# Patient Record
Sex: Female | Born: 1950
Health system: Southern US, Community
[De-identification: ages and names within clinical notes are randomized; demographics above are authoritative.]

## PROBLEM LIST (undated history)

## (undated) DIAGNOSIS — I10 Essential (primary) hypertension: Secondary | ICD-10-CM

## (undated) DIAGNOSIS — E785 Hyperlipidemia, unspecified: Secondary | ICD-10-CM

## (undated) DIAGNOSIS — D126 Benign neoplasm of colon, unspecified: Secondary | ICD-10-CM

## (undated) DIAGNOSIS — T7840XA Allergy, unspecified, initial encounter: Secondary | ICD-10-CM

## (undated) DIAGNOSIS — N209 Urinary calculus, unspecified: Secondary | ICD-10-CM

## (undated) DIAGNOSIS — K219 Gastro-esophageal reflux disease without esophagitis: Secondary | ICD-10-CM

## (undated) DIAGNOSIS — K649 Unspecified hemorrhoids: Secondary | ICD-10-CM

## (undated) DIAGNOSIS — F419 Anxiety disorder, unspecified: Secondary | ICD-10-CM

## (undated) HISTORY — DX: Allergy, unspecified, initial encounter: T78.40XA

## (undated) HISTORY — DX: Urinary calculus, unspecified: N20.9

## (undated) HISTORY — DX: Hyperlipidemia, unspecified: E78.5

## (undated) HISTORY — DX: Unspecified hemorrhoids: K64.9

## (undated) HISTORY — DX: Anxiety disorder, unspecified: F41.9

## (undated) HISTORY — DX: Gastro-esophageal reflux disease without esophagitis: K21.9

## (undated) HISTORY — PX: OTHER SURGICAL HISTORY: SHX169

## (undated) HISTORY — PX: COLONOSCOPY: SHX174

## (undated) HISTORY — DX: Essential (primary) hypertension: I10

## (undated) HISTORY — DX: Benign neoplasm of colon, unspecified: D12.6

---

## 1999-12-26 ENCOUNTER — Other Ambulatory Visit: Admission: RE | Admit: 1999-12-26 | Discharge: 1999-12-26 | Payer: Self-pay | Admitting: Endocrinology

## 1999-12-27 ENCOUNTER — Encounter: Admission: RE | Admit: 1999-12-27 | Discharge: 1999-12-27 | Payer: Self-pay | Admitting: Endocrinology

## 1999-12-27 ENCOUNTER — Encounter: Payer: Self-pay | Admitting: Endocrinology

## 2000-12-27 ENCOUNTER — Encounter: Payer: Self-pay | Admitting: Endocrinology

## 2000-12-27 ENCOUNTER — Encounter: Admission: RE | Admit: 2000-12-27 | Discharge: 2000-12-27 | Payer: Self-pay | Admitting: Endocrinology

## 2001-04-30 DIAGNOSIS — D126 Benign neoplasm of colon, unspecified: Secondary | ICD-10-CM

## 2001-04-30 HISTORY — DX: Benign neoplasm of colon, unspecified: D12.6

## 2001-05-09 ENCOUNTER — Other Ambulatory Visit: Admission: RE | Admit: 2001-05-09 | Discharge: 2001-05-09 | Payer: Self-pay | Admitting: Endocrinology

## 2002-01-08 ENCOUNTER — Encounter: Admission: RE | Admit: 2002-01-08 | Discharge: 2002-01-08 | Payer: Self-pay | Admitting: Endocrinology

## 2002-01-08 ENCOUNTER — Encounter: Payer: Self-pay | Admitting: Endocrinology

## 2003-01-25 ENCOUNTER — Encounter: Payer: Self-pay | Admitting: Endocrinology

## 2003-01-25 ENCOUNTER — Encounter: Admission: RE | Admit: 2003-01-25 | Discharge: 2003-01-25 | Payer: Self-pay | Admitting: Endocrinology

## 2004-02-10 ENCOUNTER — Ambulatory Visit (HOSPITAL_COMMUNITY): Admission: RE | Admit: 2004-02-10 | Discharge: 2004-02-10 | Payer: Self-pay | Admitting: Endocrinology

## 2004-02-17 ENCOUNTER — Encounter: Admission: RE | Admit: 2004-02-17 | Discharge: 2004-02-17 | Payer: Self-pay | Admitting: Endocrinology

## 2004-06-02 ENCOUNTER — Ambulatory Visit: Payer: Self-pay | Admitting: Gastroenterology

## 2004-06-16 ENCOUNTER — Ambulatory Visit: Payer: Self-pay | Admitting: Endocrinology

## 2004-08-11 ENCOUNTER — Encounter: Admission: RE | Admit: 2004-08-11 | Discharge: 2004-08-11 | Payer: Self-pay | Admitting: Endocrinology

## 2004-08-25 ENCOUNTER — Ambulatory Visit: Payer: Self-pay | Admitting: Endocrinology

## 2004-09-29 ENCOUNTER — Ambulatory Visit: Payer: Self-pay | Admitting: Endocrinology

## 2004-10-06 ENCOUNTER — Other Ambulatory Visit: Admission: RE | Admit: 2004-10-06 | Discharge: 2004-10-06 | Payer: Self-pay | Admitting: Endocrinology

## 2004-10-06 ENCOUNTER — Ambulatory Visit: Payer: Self-pay | Admitting: Endocrinology

## 2004-10-18 ENCOUNTER — Ambulatory Visit: Payer: Self-pay

## 2004-10-20 ENCOUNTER — Ambulatory Visit: Payer: Self-pay

## 2004-11-07 ENCOUNTER — Ambulatory Visit: Payer: Self-pay | Admitting: Endocrinology

## 2004-11-14 ENCOUNTER — Ambulatory Visit: Payer: Self-pay | Admitting: Endocrinology

## 2004-11-24 ENCOUNTER — Ambulatory Visit: Payer: Self-pay | Admitting: Endocrinology

## 2005-03-07 ENCOUNTER — Encounter: Admission: RE | Admit: 2005-03-07 | Discharge: 2005-03-07 | Payer: Self-pay | Admitting: Endocrinology

## 2006-01-09 ENCOUNTER — Ambulatory Visit: Payer: Self-pay | Admitting: Endocrinology

## 2006-01-14 ENCOUNTER — Ambulatory Visit: Payer: Self-pay | Admitting: Endocrinology

## 2006-01-14 ENCOUNTER — Other Ambulatory Visit: Admission: RE | Admit: 2006-01-14 | Discharge: 2006-01-14 | Payer: Self-pay | Admitting: Endocrinology

## 2006-01-14 ENCOUNTER — Encounter: Payer: Self-pay | Admitting: Endocrinology

## 2006-02-08 ENCOUNTER — Ambulatory Visit: Payer: Self-pay | Admitting: Family Medicine

## 2006-05-01 ENCOUNTER — Encounter: Admission: RE | Admit: 2006-05-01 | Discharge: 2006-05-01 | Payer: Self-pay | Admitting: Endocrinology

## 2006-08-23 ENCOUNTER — Ambulatory Visit: Payer: Self-pay | Admitting: Gastroenterology

## 2006-08-26 ENCOUNTER — Ambulatory Visit: Payer: Self-pay | Admitting: Endocrinology

## 2006-09-06 ENCOUNTER — Ambulatory Visit: Payer: Self-pay | Admitting: Gastroenterology

## 2006-09-06 ENCOUNTER — Encounter: Payer: Self-pay | Admitting: Gastroenterology

## 2006-12-28 ENCOUNTER — Encounter: Payer: Self-pay | Admitting: Endocrinology

## 2006-12-28 DIAGNOSIS — E1169 Type 2 diabetes mellitus with other specified complication: Secondary | ICD-10-CM | POA: Insufficient documentation

## 2006-12-28 DIAGNOSIS — I10 Essential (primary) hypertension: Secondary | ICD-10-CM | POA: Insufficient documentation

## 2006-12-28 DIAGNOSIS — E119 Type 2 diabetes mellitus without complications: Secondary | ICD-10-CM | POA: Insufficient documentation

## 2006-12-28 DIAGNOSIS — F411 Generalized anxiety disorder: Secondary | ICD-10-CM | POA: Insufficient documentation

## 2007-02-21 ENCOUNTER — Ambulatory Visit: Payer: Self-pay | Admitting: Endocrinology

## 2007-02-21 LAB — CONVERTED CEMR LAB
ALT: 33 units/L (ref 0–35)
AST: 31 units/L (ref 0–37)
Albumin: 3.4 g/dL — ABNORMAL LOW (ref 3.5–5.2)
Alkaline Phosphatase: 113 units/L (ref 39–117)
BUN: 13 mg/dL (ref 6–23)
Basophils Absolute: 0.1 10*3/uL (ref 0.0–0.1)
Basophils Relative: 0.7 % (ref 0.0–1.0)
Bilirubin Urine: NEGATIVE
CO2: 30 meq/L (ref 19–32)
Crystals: NEGATIVE
Eosinophils Relative: 3.6 % (ref 0.0–5.0)
GFR calc Af Amer: 95 mL/min
GFR calc non Af Amer: 78 mL/min
HCT: 41 % (ref 36.0–46.0)
HDL: 36.8 mg/dL — ABNORMAL LOW (ref >39.0)
Hemoglobin: 14.1 g/dL (ref 12.0–15.0)
Hgb A1c MFr Bld: 6.2 % — ABNORMAL HIGH (ref 4.6–6.0)
LDL Cholesterol: 76 mg/dL (ref 0–99)
Lymphocytes Relative: 33.3 % (ref 12.0–46.0)
Monocytes Absolute: 0.5 10*3/uL (ref 0.2–0.7)
Monocytes Relative: 5.9 % (ref 3.0–11.0)
Neutrophils Relative %: 56.5 % (ref 43.0–77.0)
Platelets: 222 10*3/uL (ref 150–400)
RBC: 4.51 M/uL (ref 3.87–5.11)
Specific Gravity, Urine: >=1.03 (ref 1.000–1.03)
TSH: 2.6 microintl units/mL (ref 0.35–5.50)
Total CHOL/HDL Ratio: 3.4
Total Protein, Urine: NEGATIVE mg/dL
Triglycerides: 58 mg/dL (ref 0–149)
Urobilinogen, UA: 0.2 (ref 0.0–1.0)
WBC: 8.1 10*3/uL (ref 4.5–10.5)
pH: 5.5 (ref 5.0–8.0)

## 2007-02-28 ENCOUNTER — Ambulatory Visit: Payer: Self-pay | Admitting: Endocrinology

## 2007-04-25 ENCOUNTER — Ambulatory Visit: Payer: Self-pay | Admitting: Endocrinology

## 2007-04-25 DIAGNOSIS — Z8619 Personal history of other infectious and parasitic diseases: Secondary | ICD-10-CM | POA: Insufficient documentation

## 2007-04-25 DIAGNOSIS — B029 Zoster without complications: Secondary | ICD-10-CM

## 2007-06-13 ENCOUNTER — Encounter: Admission: RE | Admit: 2007-06-13 | Discharge: 2007-06-13 | Payer: Self-pay | Admitting: Endocrinology

## 2007-08-20 ENCOUNTER — Encounter: Payer: Self-pay | Admitting: Endocrinology

## 2008-03-09 ENCOUNTER — Ambulatory Visit: Payer: Self-pay | Admitting: Endocrinology

## 2008-03-10 LAB — CONVERTED CEMR LAB
ALT: 31 units/L (ref 0–35)
AST: 28 units/L (ref 0–37)
Alkaline Phosphatase: 99 units/L (ref 39–117)
BUN: 12 mg/dL (ref 6–23)
Basophils Absolute: 0 10*3/uL (ref 0.0–0.1)
Basophils Relative: 0.4 % (ref 0.0–3.0)
Chloride: 100 meq/L (ref 96–112)
Cholesterol: 111 mg/dL (ref 0–200)
Eosinophils Relative: 5.7 % — ABNORMAL HIGH (ref 0.0–5.0)
GFR calc Af Amer: 94 mL/min
GFR calc non Af Amer: 78 mL/min
HDL: 36.4 mg/dL — ABNORMAL LOW (ref >39.0)
Hemoglobin: 13.7 g/dL (ref 12.0–15.0)
Ketones, ur: NEGATIVE mg/dL
Leukocytes, UA: NEGATIVE
MCHC: 33.6 g/dL (ref 30.0–36.0)
MCV: 91.5 fL (ref 78.0–100.0)
Monocytes Relative: 7 % (ref 3.0–12.0)
Neutro Abs: 4.7 10*3/uL (ref 1.4–7.7)
Potassium: 3.7 meq/L (ref 3.5–5.1)
RBC: 4.45 M/uL (ref 3.87–5.11)
Sodium: 141 meq/L (ref 135–145)
Specific Gravity, Urine: 1.025 (ref 1.000–1.03)
TSH: 3.02 microintl units/mL (ref 0.35–5.50)
Total Bilirubin: 0.9 mg/dL (ref 0.3–1.2)
Total CHOL/HDL Ratio: 3
Total Protein: 6.6 g/dL (ref 6.0–8.3)
Urine Glucose: NEGATIVE mg/dL
WBC: 9.3 10*3/uL (ref 4.5–10.5)

## 2008-03-18 ENCOUNTER — Ambulatory Visit: Payer: Self-pay | Admitting: Endocrinology

## 2008-03-18 DIAGNOSIS — E7800 Pure hypercholesterolemia, unspecified: Secondary | ICD-10-CM

## 2008-03-18 DIAGNOSIS — E785 Hyperlipidemia, unspecified: Secondary | ICD-10-CM | POA: Insufficient documentation

## 2008-03-18 DIAGNOSIS — N209 Urinary calculus, unspecified: Secondary | ICD-10-CM

## 2008-03-18 DIAGNOSIS — Z87442 Personal history of urinary calculi: Secondary | ICD-10-CM | POA: Insufficient documentation

## 2008-03-23 ENCOUNTER — Ambulatory Visit: Payer: Self-pay | Admitting: Family Medicine

## 2008-03-23 ENCOUNTER — Encounter: Payer: Self-pay | Admitting: Endocrinology

## 2008-04-19 ENCOUNTER — Telehealth: Payer: Self-pay | Admitting: Endocrinology

## 2008-05-16 ENCOUNTER — Emergency Department (HOSPITAL_COMMUNITY): Admission: EM | Admit: 2008-05-16 | Discharge: 2008-05-16 | Payer: Self-pay | Admitting: Emergency Medicine

## 2008-05-17 ENCOUNTER — Ambulatory Visit: Payer: Self-pay | Admitting: Internal Medicine

## 2008-05-17 DIAGNOSIS — H8309 Labyrinthitis, unspecified ear: Secondary | ICD-10-CM | POA: Insufficient documentation

## 2008-05-17 DIAGNOSIS — G459 Transient cerebral ischemic attack, unspecified: Secondary | ICD-10-CM

## 2008-05-17 DIAGNOSIS — I679 Cerebrovascular disease, unspecified: Secondary | ICD-10-CM | POA: Insufficient documentation

## 2008-05-19 ENCOUNTER — Encounter: Admission: RE | Admit: 2008-05-19 | Discharge: 2008-05-19 | Payer: Self-pay | Admitting: Internal Medicine

## 2008-05-20 ENCOUNTER — Encounter: Payer: Self-pay | Admitting: Internal Medicine

## 2008-05-21 ENCOUNTER — Telehealth (INDEPENDENT_AMBULATORY_CARE_PROVIDER_SITE_OTHER): Payer: Self-pay | Admitting: *Deleted

## 2008-05-25 ENCOUNTER — Ambulatory Visit: Payer: Self-pay | Admitting: Endocrinology

## 2008-05-25 DIAGNOSIS — R42 Dizziness and giddiness: Secondary | ICD-10-CM | POA: Insufficient documentation

## 2008-05-25 LAB — CONVERTED CEMR LAB
Basophils Relative: 0.7 % (ref 0.0–3.0)
CO2: 36 meq/L — ABNORMAL HIGH (ref 19–32)
Calcium: 9.3 mg/dL (ref 8.4–10.5)
Creatinine, Ser: 1 mg/dL (ref 0.4–1.2)
Eosinophils Absolute: 0.3 10*3/uL (ref 0.0–0.7)
GFR calc Af Amer: 73 mL/min
GFR calc non Af Amer: 60 mL/min
HCT: 42.2 % (ref 36.0–46.0)
Hgb A1c MFr Bld: 6.7 % — ABNORMAL HIGH (ref 4.6–6.0)
Lymphocytes Relative: 23.6 % (ref 12.0–46.0)
MCV: 90.6 fL (ref 78.0–100.0)
Monocytes Absolute: 0.4 10*3/uL (ref 0.1–1.0)
Monocytes Relative: 3 % (ref 3.0–12.0)
Neutrophils Relative %: 70.1 % (ref 43.0–77.0)
Potassium: 3.3 meq/L — ABNORMAL LOW (ref 3.5–5.1)
RDW: 12.4 % (ref 11.5–14.6)

## 2008-05-27 ENCOUNTER — Encounter: Payer: Self-pay | Admitting: Endocrinology

## 2008-06-03 ENCOUNTER — Encounter: Payer: Self-pay | Admitting: Endocrinology

## 2008-06-08 ENCOUNTER — Ambulatory Visit: Payer: Self-pay | Admitting: Endocrinology

## 2008-06-08 DIAGNOSIS — E876 Hypokalemia: Secondary | ICD-10-CM | POA: Insufficient documentation

## 2008-06-08 LAB — CONVERTED CEMR LAB
Calcium: 10.3 mg/dL (ref 8.4–10.5)
GFR calc non Af Amer: 78 mL/min
Glucose, Bld: 100 mg/dL — ABNORMAL HIGH (ref 70–99)
Potassium: 3.3 meq/L — ABNORMAL LOW (ref 3.5–5.1)

## 2008-06-10 ENCOUNTER — Telehealth (INDEPENDENT_AMBULATORY_CARE_PROVIDER_SITE_OTHER): Payer: Self-pay | Admitting: *Deleted

## 2008-06-10 ENCOUNTER — Encounter: Payer: Self-pay | Admitting: Endocrinology

## 2008-07-16 ENCOUNTER — Encounter: Admission: RE | Admit: 2008-07-16 | Discharge: 2008-07-16 | Payer: Self-pay | Admitting: Endocrinology

## 2009-05-19 ENCOUNTER — Ambulatory Visit: Payer: Self-pay | Admitting: Endocrinology

## 2009-05-19 LAB — CONVERTED CEMR LAB
ALT: 25 units/L (ref 0–35)
Albumin: 3.5 g/dL (ref 3.5–5.2)
Alkaline Phosphatase: 108 units/L (ref 39–117)
BUN: 13 mg/dL (ref 6–23)
Basophils Relative: 0.7 % (ref 0.0–3.0)
Bilirubin, Direct: 0.2 mg/dL (ref 0.0–0.3)
CO2: 33 meq/L — ABNORMAL HIGH (ref 19–32)
Cholesterol: 114 mg/dL (ref 0–200)
Eosinophils Relative: 4.3 % (ref 0.0–5.0)
Glucose, Bld: 102 mg/dL — ABNORMAL HIGH (ref 70–99)
Hemoglobin: 13.9 g/dL (ref 12.0–15.0)
LDL Cholesterol: 58 mg/dL (ref 0–99)
Lymphocytes Relative: 34.6 % (ref 12.0–46.0)
MCHC: 32.9 g/dL (ref 30.0–36.0)
Microalb Creat Ratio: 2.5 mg/g (ref 0.0–30.0)
Microalb, Ur: 0.5 mg/dL (ref 0.0–1.9)
Monocytes Absolute: 0.5 10*3/uL (ref 0.1–1.0)
Neutro Abs: 4.3 10*3/uL (ref 1.4–7.7)
Neutrophils Relative %: 54.6 % (ref 43.0–77.0)
Nitrite: NEGATIVE
Platelets: 244 10*3/uL (ref 150.0–400.0)
Potassium: 3.4 meq/L — ABNORMAL LOW (ref 3.5–5.1)
Total CHOL/HDL Ratio: 3
Urobilinogen, UA: 0.2 (ref 0.0–1.0)
WBC: 8 10*3/uL (ref 4.5–10.5)

## 2009-05-24 ENCOUNTER — Ambulatory Visit: Payer: Self-pay | Admitting: Endocrinology

## 2009-08-02 ENCOUNTER — Encounter: Admission: RE | Admit: 2009-08-02 | Discharge: 2009-08-02 | Payer: Self-pay | Admitting: Endocrinology

## 2009-08-02 LAB — HM MAMMOGRAPHY

## 2009-11-08 ENCOUNTER — Ambulatory Visit: Payer: Self-pay | Admitting: Endocrinology

## 2009-11-08 DIAGNOSIS — M26629 Arthralgia of temporomandibular joint, unspecified side: Secondary | ICD-10-CM | POA: Insufficient documentation

## 2009-11-09 LAB — CONVERTED CEMR LAB
CO2: 30 meq/L (ref 19–32)
Creatinine, Ser: 0.8 mg/dL (ref 0.4–1.2)
Potassium: 4.3 meq/L (ref 3.5–5.1)
Sodium: 142 meq/L (ref 135–145)

## 2009-12-15 ENCOUNTER — Telehealth: Payer: Self-pay | Admitting: Internal Medicine

## 2010-01-12 ENCOUNTER — Encounter: Payer: Self-pay | Admitting: Endocrinology

## 2010-02-13 ENCOUNTER — Telehealth: Payer: Self-pay | Admitting: Endocrinology

## 2010-02-13 DIAGNOSIS — K1370 Unspecified lesions of oral mucosa: Secondary | ICD-10-CM | POA: Insufficient documentation

## 2010-02-21 ENCOUNTER — Encounter: Payer: Self-pay | Admitting: Endocrinology

## 2010-04-17 ENCOUNTER — Encounter: Payer: Self-pay | Admitting: Endocrinology

## 2010-05-20 ENCOUNTER — Encounter: Payer: Self-pay | Admitting: Endocrinology

## 2010-05-21 ENCOUNTER — Encounter: Payer: Self-pay | Admitting: Endocrinology

## 2010-05-30 NOTE — Assessment & Plan Note (Signed)
Summary: SPOTS ON TONGUE/ COUPLE OF WEEKS/NWS  #   Vital Signs:  Patient profile:   60 year old female Height:      62 inches (157.48 cm) Weight:      237.50 pounds (107.95 kg) BMI:     43.60 O2 Sat:      96 % on Room air Temp:     98.7 degrees F (37.06 degrees C) oral Pulse rate:   100 / minute BP sitting:   124 / 72  (left arm) Cuff size:   large  Vitals Entered By: Brenton Grills MA (November 08, 2009 3:40 PM)  O2 Flow:  Room air CC: spots on tongue/pt states she no longer gets yearly paps/aj   CC:  spots on tongue/pt states she no longer gets yearly paps/aj.  History of Present Illness: pt has few weeks of slight jaw angle "hanging up," and assoc grinding of teeth.   she also has of "spots on my tomgue," but no associated pain.   she takes klor as rx'ed. no cbg record, but states cbg's are well-controlled  Current Medications (verified): 1)  Adult Aspirin Low Strength 81 Mg  Tbdp (Aspirin) .... Take 1 By Mouth Qd 2)  Hyzaar 100-25 Mg  Tabs (Losartan Potassium-Hctz) .... Take 1 By Mouth Qd 3)  Vytorin 10-80 Mg  Tabs (Ezetimibe-Simvastatin) .... Take 1 By Mouth Qd 4)  Metformin Hcl 500 Mg  Tb24 (Metformin Hcl) .... Take 1 By Mouth Two Times A Day Qd 5)  Norvasc 5 Mg  Tabs (Amlodipine Besylate) .... Take 1 By Mouth Qd 6)  Carvedilol 12.5 Mg Tabs (Carvedilol) .... Bid 7)  Klor-Con M10 10 Meq Cr-Tabs (Potassium Chloride Crys Cr) .... Qd  Allergies (verified): No Known Drug Allergies  Past History:  Past Medical History: Last updated: 12/28/2006 Anxiety Diabetes mellitus, type II Hypertension Smoker (Quit 1975) Dyslipidemia Urolithiasis  Review of Systems  The patient denies fever.         denies earache  Physical Exam  General:  morbidly obese.  no distress  Head:  head: no deformity eyes: no periorbital swelling, no proptosis external nose and ears are normal mouth: no lesion seen Mouth:  the tomgue has patchy mild erythema Additional Exam:  Potassium                  4.3 mEq/L                   3.5-5.1   Chloride                  107 mEq/L                   96-112   Carbon Dioxide            30 mEq/L                    19-32   Glucose                   95 mg/dL                    24-40   BUN                       15 mg/dL                    1-02   Creatinine  0.8 mg/dL                   5.9-5.6   Calcium                   9.5 mg/dL                   3.8-75.6      Hemoglobin A1C       [H]  6.6 %     Impression & Recommendations:  Problem # 1:  TMJ PAIN (ICD-524.62) Assessment New  Problem # 2:  ? thrush  Problem # 3:  HYPOKALEMIA (ICD-276.8) well-replaced  Problem # 4:  DIABETES MELLITUS, TYPE II (ICD-250.00) well-controlled  Medications Added to Medication List This Visit: 1)  Fluconazole 150 Mg Tabs (Fluconazole) .Marland Kitchen.. 1 once daily  Other Orders: TLB-BMP (Basic Metabolic Panel-BMET) (80048-METABOL) TLB-A1C / Hgb A1C (Glycohemoglobin) (83036-A1C) Est. Patient Level IV (43329)  Patient Instructions: 1)  for your "TMJ" symptomas, the only good treatment is the appliance the dentist could make for you. 2)  blood tests are being ordered for you today.  please call [phone] to hear your test results. 3)  fluconazole 150 mg once daily, x 3 days.  while on this, do not take the vytorin. 4)  (update: i left message on phone-tree:  rx as we discussed) Prescriptions: FLUCONAZOLE 150 MG TABS (FLUCONAZOLE) 1 once daily  #3 x 0   Entered and Authorized by:   Minus Breeding MD   Signed by:   Minus Breeding MD on 11/08/2009   Method used:   Electronically to        [facility]. [phone]* (retail)       [address]       [place], [place]  [address]       Ph: [phone]       Fax: [phone]   RxID:   [id number]

## 2010-05-30 NOTE — Assessment & Plan Note (Signed)
Summary: CPX / NWS #   Vital Signs:  Patient profile:   60 year old female Height:      62 inches (157.48 cm) Weight:      234.38 pounds (106.54 kg) BMI:     43.02 O2 Sat:      95 % on Room air Temp:     97.2 degrees F (36.22 degrees C) oral Pulse rate:   72 / minute BP sitting:   124 / 74  (left arm) Cuff size:   large  Vitals Entered By: Josph Macho CMA (May 24, 2009 2:06 PM)  O2 Flow:  Room air CC: Physical/ CF Is Patient Diabetic? Yes   CC:  Physical/ CF.  History of Present Illness: here for regular wellness examination.  He's feeling pretty well in general, and does not drink or smoke.   Current Medications (verified): 1)  Adult Aspirin Low Strength 81 Mg  Tbdp (Aspirin) .... Take 1 By Mouth Qd 2)  Hyzaar 100-25 Mg  Tabs (Losartan Potassium-Hctz) .... Take 1 By Mouth Qd 3)  Vytorin 10-80 Mg  Tabs (Ezetimibe-Simvastatin) .... Take 1 By Mouth Qd 4)  Fosamax 70 Mg  Tabs (Alendronate Sodium) .... Take 1 By Mouth Q Week 5)  Metformin Hcl 500 Mg  Tb24 (Metformin Hcl) .... Take 1 By Mouth Two Times A Day Qd 6)  Norvasc 5 Mg  Tabs (Amlodipine Besylate) .... Take 1 By Mouth Qd 7)  Carvedilol 12.5 Mg Tabs (Carvedilol) .... Bid 8)  Klor-Con M10 10 Meq Cr-Tabs (Potassium Chloride Crys Cr) .... Qd  Allergies (verified): No Known Drug Allergies  Family History: Reviewed history from 05/17/2008 and no changes required. mother had breast cancer sister had colon cancer Family History of Stroke F 1st degree relative <60:  Mother CVA x 2  Sister had CNS aneurysm  Sister had ICH  Social History: Reviewed history from 05/17/2008 and no changes required. single works indust [employer] Married Never Smoked Alcohol use-no Drug use-no Regular exercise-no  Review of Systems  The patient denies fever, weight loss, weight gain, vision loss, decreased hearing, chest pain, syncope, dyspnea on exertion, prolonged cough, abdominal pain, melena, hematochezia, severe  indigestion/heartburn, hematuria, suspicious skin lesions, and depression.    Physical Exam  General:  morbidly obese.   Head:  head: no deformity eyes: no periorbital swelling, no proptosis external nose and ears are normal mouth: no lesion seen Neck:  Supple without thyroid enlargement or tenderness. No cervical lymphadenopathy Breasts:  No tenderness, masses, nipple discharge, or skin abnormalities.  Lungs:  Clear to auscultation bilaterally. Normal respiratory effort.  Heart:  Regular rate and rhythm without murmurs or gallops noted. Normal S1,S2.   Abdomen:  abdomen is soft, nontender.  no hepatosplenomegaly.   not distended.  no hernia  Rectal:  normal external and internal exam.  heme neg  Genitalia:  Pelvic Exam:        External: normal female genitalia without lesions or masses        Vagina: normal without lesions or masses        Cervix: normal without lesions or masses        Adnexa: normal bimanual exam without masses or fullness        Uterus: normal by palpation        Pap smear: not performed Pulses:  dorsalis pedis intact bilat.  no carotid bruit  Neurologic:  cn 2-12 grossly intact.   readily moves all 4's.   sensation is intact to touch on  the feet  Skin:  normal texture and temp.  no rash.  not diaphoretic  Cervical Nodes:  No significant adenopathy.  Psych:  Alert and cooperative; normal mood and affect; normal attention span and concentration.   Additional Exam:  SEPARATE EVALUATION FOLLOWS--EACH PROBLEM HERE IS NEW, NOT RESPONDING TO TREATMENT, OR POSES SIGNIFICANT RISK TO THE PATIENT'S HEALTH: HISTORY OF THE PRESENT ILLNESS: pt says she is not taking her potassium pill pt says she has been on fosamax x more then 5 years. PAST MEDICAL HISTORY reviewed and up to date today REVIEW OF SYSTEMS: denies headache PHYSICAL EXAMINATION: no deformity.  no ulcer on the feet.  feet are of normal color and temp.  no edema no msk deformity  LAB/XRAY  RESULTS: Potassium            [L]  3.4 mEq/L  IMPRESSION: hypokalemia, therapy limited by noncompliance.  i'll do the best i can. PLAN: see instruction sheet    Impression & Recommendations:  Problem # 1:  ROUTINE GENERAL MEDICAL EXAM@HEALTH  CARE FACL (ICD-V70.0)  Other Orders: EKG w/ Interpretation (93000) Pneumococcal Vaccine (04540) Admin 1st Vaccine (98119) Est. Patient Level III (14782) Est. Patient 40-64 years (95621)  Patient Instructions: 1)  resume potassium pill 1/day 2)  stop fosamax 3)  Please schedule a follow-up appointment in 1 year. Prescriptions: KLOR-CON M10 10 MEQ CR-TABS (POTASSIUM CHLORIDE CRYS CR) qd  #90 x 3   Entered and Authorized by:   Minus Breeding MD   Signed by:   Minus Breeding MD on 05/24/2009   Method used:   Electronically to        [facility]* (mail-order)             ,          Ph: [phone]       Fax: [phone]   RxID:   [id number] CARVEDILOL 12.5 MG TABS (CARVEDILOL) bid  #180 x 3   Entered and Authorized by:   Minus Breeding MD   Signed by:   Minus Breeding MD on 05/24/2009   Method used:   Electronically to        [facility]* (mail-order)             ,          Ph: [phone]       Fax: [phone]   RxID:   [id number] NORVASC 5 MG  TABS (AMLODIPINE BESYLATE) take 1 by mouth qd  #90 x 3   Entered and Authorized by:   Minus Breeding MD   Signed by:   Minus Breeding MD on 05/24/2009   Method used:   Electronically to        [facility]* (mail-order)             ,          Ph: [phone]       Fax: [phone]   RxID:   [id number] METFORMIN HCL 500 MG  TB24 (METFORMIN HCL) take 1 by mouth two times a day qd  #180 x 3   Entered and Authorized by:   Minus Breeding MD   Signed by:   Minus Breeding MD on 05/24/2009   Method used:   Electronically to        [facility]* (mail-order)             ,          Ph: [phone]  Fax: [phone]   RxID:   [id number] VYTORIN 10-80  MG  TABS (EZETIMIBE-SIMVASTATIN) take 1 by mouth qd  #90 x 3   Entered and Authorized by:   Minus Breeding MD   Signed by:   Minus Breeding MD on 05/24/2009   Method used:   Electronically to        [facility]* (mail-order)             ,          Ph: [phone]       Fax: [phone]   RxID:   [id number] HYZAAR 100-25 MG  TABS (LOSARTAN POTASSIUM-HCTZ) take 1 by mouth qd  #90 x 3   Entered and Authorized by:   Minus Breeding MD   Signed by:   Minus Breeding MD on 05/24/2009   Method used:   Electronically to        [facility]* (mail-order)             ,          Ph: [phone]       Fax: [phone]   RxID:   [id number]     Immunizations Administered:  Pneumonia Vaccine:    Vaccine Type: Pneumovax    Site: left deltoid    Mfr: Merck    Dose: 0.5 ml    Route: IM    Given by: Josph Macho CMA    Exp. Date: 05/27/2010    Lot #: [id number]    VIS given: 11/26/95 version given May 24, 2009.     Bone Density  Procedure date:  02/08/2006  Findings:      Overall, pts bone density test results wre diagnosti of osteopenia. lumbar spine was diagnostic of osteopenia proximal femur (total hip) was normal

## 2010-05-30 NOTE — Progress Notes (Signed)
  Phone Note Call from Patient   Caller: Patient [phone] Summary of Call: Pt called stating that medication only helps with spots on tongue temporarily and once she has finished course they return. Please advise. Initial call taken by: Crissie Sickles, [place],  February 13, 2010 2:20 PM  Follow-up for Phone Call        i have ref ent you will be called with a day and time for an appointment Follow-up by: Donavan Foil MD,  February 13, 2010 2:48 PM  Additional Follow-up for Phone Call Additional follow up Details #1::        Pt informed via home VM Additional Follow-up by: Crissie Sickles, CMA,  February 13, 2010 3:31 PM  New Problems: ORAL ULCER (ICD-528.9)   New Problems: ORAL ULCER (ICD-528.9)

## 2010-05-30 NOTE — Consult Note (Signed)
Summary: Consult/[facility]  Consult/[facility]   Imported By: Sherian Rein 03/01/2010 14:45:36  _____________________________________________________________________  External Attachment:    Type:   Image     Comment:   External Document

## 2010-05-30 NOTE — Progress Notes (Signed)
Summary: [facility] form letter  [facility] form letter   Imported By: Lester Vermillion 05/28/2009 09:58:49  _____________________________________________________________________  External Attachment:    Type:   Image     Comment:   External Document

## 2010-05-30 NOTE — Letter (Signed)
Summary: [facility]  [facility]   Imported By: Lennie Odor 02/02/2010 15:09:05  _____________________________________________________________________  External Attachment:    Type:   Image     Comment:   External Document

## 2010-05-30 NOTE — Progress Notes (Signed)
Summary: Rx refill req/[facility] pt  Phone Note Call from Patient Call back at Home Phone [phone]   Caller: Patient Summary of Call: Pt called stating that spots on her tongue have returned. Pt is requesting refill of Fluconazole given at [facility] 11/08/2009. Initial call taken by: Margaret Pyle, CMA,  December 15, 2009 4:48 PM    Prescriptions: FLUCONAZOLE 150 MG TABS (FLUCONAZOLE) 1 once daily  #7 x 1   Entered and Authorized by:   Etta Grandchild MD   Signed by:   Etta Grandchild MD on 12/16/2009   Method used:   Electronically to        [facility]. [phone]* (retail)       [address]       [place], [place]  [address]       Ph: [phone]       Fax: [phone]   RxID:   [id number]

## 2010-06-01 NOTE — Consult Note (Signed)
Summary: [facility]  [facility]   Imported By: Sherian Rein 05/05/2010 07:48:09  _____________________________________________________________________  External Attachment:    Type:   Image     Comment:   External Document

## 2010-06-30 ENCOUNTER — Other Ambulatory Visit: Payer: Self-pay | Admitting: Endocrinology

## 2010-06-30 DIAGNOSIS — Z1231 Encounter for screening mammogram for malignant neoplasm of breast: Secondary | ICD-10-CM

## 2010-08-08 ENCOUNTER — Other Ambulatory Visit: Payer: Self-pay | Admitting: Endocrinology

## 2010-08-14 LAB — GLUCOSE, CAPILLARY

## 2010-08-15 ENCOUNTER — Other Ambulatory Visit (INDEPENDENT_AMBULATORY_CARE_PROVIDER_SITE_OTHER): Payer: BC Managed Care – PPO | Admitting: Endocrinology

## 2010-08-15 ENCOUNTER — Other Ambulatory Visit: Payer: Self-pay | Admitting: Endocrinology

## 2010-08-15 ENCOUNTER — Other Ambulatory Visit (INDEPENDENT_AMBULATORY_CARE_PROVIDER_SITE_OTHER): Payer: BC Managed Care – PPO

## 2010-08-15 DIAGNOSIS — Z0000 Encounter for general adult medical examination without abnormal findings: Secondary | ICD-10-CM

## 2010-08-15 LAB — CBC WITH DIFFERENTIAL/PLATELET
Basophils Relative: 0.5 % (ref 0.0–3.0)
Eosinophils Relative: 3.1 % (ref 0.0–5.0)
Lymphs Abs: 3.6 10*3/uL (ref 0.7–4.0)
MCHC: 33.9 g/dL (ref 30.0–36.0)
Monocytes Absolute: 0.5 10*3/uL (ref 0.1–1.0)
Monocytes Relative: 5.7 % (ref 3.0–12.0)
Neutrophils Relative %: 52.9 % (ref 43.0–77.0)
Platelets: 267 10*3/uL (ref 150.0–400.0)
RDW: 13.1 % (ref 11.5–14.6)

## 2010-08-15 LAB — LIPID PANEL
Cholesterol: 115 mg/dL (ref 0–200)
HDL: 42.1 mg/dL (ref >39.00)
Total CHOL/HDL Ratio: 3
Triglycerides: 56 mg/dL (ref 0.0–149.0)
VLDL: 11.2 mg/dL (ref 0.0–40.0)

## 2010-08-15 LAB — BASIC METABOLIC PANEL
CO2: 32 mEq/L (ref 19–32)
Chloride: 105 mEq/L (ref 96–112)
Glucose, Bld: 94 mg/dL (ref 70–99)
Potassium: 4.1 mEq/L (ref 3.5–5.1)

## 2010-08-15 LAB — HEPATIC FUNCTION PANEL
ALT: 25 U/L (ref 0–35)
Bilirubin, Direct: 0.1 mg/dL (ref 0.0–0.3)

## 2010-08-15 LAB — URINALYSIS, ROUTINE W REFLEX MICROSCOPIC
Bilirubin Urine: NEGATIVE
Ketones, ur: NEGATIVE
Nitrite: NEGATIVE
Urobilinogen, UA: 0.2 (ref 0.0–1.0)
pH: 5.5 (ref 5.0–8.0)

## 2010-08-18 ENCOUNTER — Ambulatory Visit
Admission: RE | Admit: 2010-08-18 | Discharge: 2010-08-18 | Disposition: A | Discharge status: Home / Self Care | Payer: BLUE CROSS/BLUE SHIELD | Source: Ambulatory Visit | Attending: Endocrinology | Admitting: Endocrinology

## 2010-08-18 ENCOUNTER — Ambulatory Visit (INDEPENDENT_AMBULATORY_CARE_PROVIDER_SITE_OTHER): Payer: BLUE CROSS/BLUE SHIELD | Admitting: Endocrinology

## 2010-08-18 ENCOUNTER — Other Ambulatory Visit: Payer: BLUE CROSS/BLUE SHIELD

## 2010-08-18 ENCOUNTER — Other Ambulatory Visit (INDEPENDENT_AMBULATORY_CARE_PROVIDER_SITE_OTHER): Payer: BC Managed Care – PPO

## 2010-08-18 ENCOUNTER — Encounter: Payer: Self-pay | Admitting: Endocrinology

## 2010-08-18 VITALS — BP 120/88 | HR 95 | Temp 98.5°F | Ht 62.0 in | Wt 227.6 lb

## 2010-08-18 DIAGNOSIS — E119 Type 2 diabetes mellitus without complications: Secondary | ICD-10-CM

## 2010-08-18 DIAGNOSIS — Z0000 Encounter for general adult medical examination without abnormal findings: Secondary | ICD-10-CM

## 2010-08-18 DIAGNOSIS — Z1231 Encounter for screening mammogram for malignant neoplasm of breast: Secondary | ICD-10-CM

## 2010-08-18 DIAGNOSIS — I8390 Asymptomatic varicose veins of unspecified lower extremity: Secondary | ICD-10-CM

## 2010-08-18 MED ORDER — TRIAMCINOLONE ACETONIDE 0.025 % EX CREA: TOPICAL_CREAM | CUTANEOUS | Status: DC

## 2010-08-18 MED ORDER — LOSARTAN POTASSIUM-HCTZ 100-25 MG PO TABS: 1.0000 | ORAL_TABLET | Freq: Every day | ORAL | Status: DC

## 2010-08-18 MED ORDER — METFORMIN HCL ER 500 MG PO TB24: 1000.0000 mg | ORAL_TABLET | Freq: Two times a day (BID) | ORAL | Status: DC

## 2010-08-18 NOTE — Progress Notes (Signed)
  Subjective:    Patient ID: Jennifer Saunders, female    DOB: [birth year], 60 y.o.   MRN: [id number]  HPI here for regular wellness examination.  He's feeling pretty well in general, and says chronic med probs are stable, except as noted below.  Review of Systems  Constitutional:       She has lost a few lbs, due to her efforts  HENT: Negative for hearing loss.   Eyes: Negative for visual disturbance.  Respiratory: Negative for shortness of breath.   Cardiovascular: Negative for chest pain.  Gastrointestinal: Negative for anal bleeding.  Genitourinary: Negative for hematuria.  Musculoskeletal: Negative for arthralgias.  Skin: Negative for rash.  Neurological: Negative for syncope.  Hematological: Bruises/bleeds easily.  Psychiatric/Behavioral: Negative for dysphoric mood. The patient is not nervous/anxious.        Objective:   Physical Exam VS: see vs page HEAD: head: no deformity eyes: no periorbital swelling, no proptosis external nose and ears are normal mouth: no lesion seen NECK: supple, thyroid is not enlarged CHEST WALL: no deformity BREASTS:  No mass.  No d/c CV: reg rate and rhythm, no murmur ABD: abdomen is soft, nontender.  no hepatosplenomegaly.  not distended.  no hernia GENITALIA:  Normal external female.  Normal bimanual exam RECTAL: normal external and internal exam.  heme neg MUSCULOSKELETAL: muscle bulk and strength are grossly normal.  no obvious joint swelling.  gait is normal and steady PULSES: no carotid bruit NEURO:  cn 2-12 grossly intact.   readily moves all 4's.   SKIN:  Normal texture and temperature.  No rash or suspicious lesion is visible.  There are multiple skin tags on the neck.  NODES:  None palpable at the neck PSYCH: alert, oriented x3.  Does not appear anxious nor depressed.    SEPARATE EVALUATION FOLLOWS--EACH PROBLEM HERE IS NEW, NOT RESPONDING TO TREATMENT, OR POSES SIGNIFICANT RISK TO THE PATIENT'S HEALTH: HISTORY OF THE PRESENT  ILLNESS: Pt states 1 year of slight varicose veins at the right leg, and assoc itching PAST MEDICAL HISTORY reviewed and up to date today REVIEW OF SYSTEMS: No edema PHYSICAL EXAMINATION: GENERAL: no distress.  obese Pulses: dorsalis pedis intact bilat.   Feet: no deformity.  no ulcer on the feet.  feet are of normal color and temp.  no edema.  There are bilar leg varicosities.  There is a mild eczematous rash on the right leg Neuro: sensation is intact to touch on the feet LAB/XRAY RESULTS: Lab Results  Component Value Date   HGBA1C 6.6* 08/18/2010   IMPRESSION: Dm, needs increased rx Varicose veins, with some stasis dermatitis PLAN: See instruction page    Assessment & Plan:  Wellness visit, with probs stable except as noted

## 2010-08-18 NOTE — Patient Instructions (Addendum)
blood tests are being ordered for you today.  please call [phone] to hear your test results. Refer to a vein specialist. you will be called with a day and time for an appointment. i have sent a prescription for an anti-itch cream to your pharmacy. please consider these measures for your health:  minimize alcohol.  do not use tobacco products.  have a colonoscopy at least every 10 years from age 60.  keep firearms safely stored.  always use seat belts.  have working smoke alarms in your home.  see an eye doctor and dentist regularly.  never drive under the influence of alcohol or drugs (including prescription drugs).   please let me know what your wishes would be, if artificial life support measures should become necessary.  it is critically important to prevent falling down (keep floor areas well-lit, dry, and free of loose objects) Please make a follow-up appointment in 6 months (update: i left message on phone-tree:  Increase metformin to 2x500 mg bid)

## 2010-09-04 ENCOUNTER — Other Ambulatory Visit: Payer: Self-pay

## 2010-09-04 MED ORDER — CARVEDILOL 12.5 MG PO TABS: 12.5000 mg | ORAL_TABLET | Freq: Two times a day (BID) | ORAL | Status: DC

## 2010-09-04 MED ORDER — AMLODIPINE BESYLATE 5 MG PO TABS: 5.0000 mg | ORAL_TABLET | Freq: Every day | ORAL | Status: DC

## 2010-09-07 ENCOUNTER — Other Ambulatory Visit: Payer: Self-pay

## 2010-09-07 MED ORDER — AMLODIPINE BESYLATE 5 MG PO TABS: 5.0000 mg | ORAL_TABLET | Freq: Every day | ORAL | Status: DC

## 2010-09-07 MED ORDER — CARVEDILOL 12.5 MG PO TABS: 12.5000 mg | ORAL_TABLET | Freq: Two times a day (BID) | ORAL | Status: DC

## 2010-09-27 ENCOUNTER — Encounter (INDEPENDENT_AMBULATORY_CARE_PROVIDER_SITE_OTHER): Payer: BC Managed Care – PPO

## 2010-09-27 ENCOUNTER — Encounter (INDEPENDENT_AMBULATORY_CARE_PROVIDER_SITE_OTHER): Payer: BC Managed Care – PPO | Admitting: Vascular Surgery

## 2010-09-27 DIAGNOSIS — I83893 Varicose veins of bilateral lower extremities with other complications: Secondary | ICD-10-CM

## 2010-09-27 NOTE — Consult Note (Signed)
NEW PATIENT CONSULTATION  Jennifer Saunders, Jennifer Saunders DOB:  [birth year]                                       09/27/2010 OTLXB#:[id number]  Patient presents today for evaluation of venous pathology in her right leg.  She is a very pleasant 60 year old female who reports an itching sensation over some prominent superficial varices in her right pretibial area.  She has some superficial skin irritation at this area.  She does have some small varices over her patella.  She denies any prior history of bleeding, any history of DVT, and does not have any specific pain in her right leg but does have some irritation of the skin over this area. She does have a history of diabetes, non-insulin-dependent, and hypertension.  SOCIAL HISTORY:  She is single.  She does not smoke, having quit in 1975, and does not drink alcohol.  FAMILY HISTORY:  Negative for premature atherosclerotic disease.  She does have history of varices in her mother.  REVIEW OF SYSTEMS:  She does report weight loss. NEUROLOGIC:  Positive for dizziness. MUSCULOSKELETAL:  For arthritis. Her review of systems is otherwise negative.  PHYSICAL EXAMINATION:  A well-developed, obese black female in no acute distress.  Blood pressure is 124/77, pulse 85, respirations 20.  HEENT is normal.  She does have palpable dorsalis pedis pulses bilaterally. Musculoskeletal shows no major deformities or cyanosis.  Neurologic:  No focal weakness or paresthesias.  Skin without ulcers or rashes except for the area of concern.  There is a small area of skin irritation around some superficial varices.  She does not have swelling.  She underwent a formal venous duplex in our office, and this does show evidence of significant reflux in her deep system.  She does have reflux in some tributary branches of her saphenous vein.  The area of concern was imaged by myself with SonoSite ultrasound, and this does show some prominent superficial  varices under the skin.  These do not appear to be arising from her saphenous vein at this level.  The saphenous vein at this level is normal.  I discussed this with patient, explaining I do not feel that there is any danger associated with this and feel completely comfortable with observation only.  I did discuss the potential benefits from compression garments.  She reports she has tried these in years past but had extreme difficulty related to these since she does __________ hot environment. I do feel that these could potentially be treated with sclerotherapy if she had progressive symptoms.  She reports that she is comfortable with this level of discomfort and wishes to be seen on an as-needed basis. She will notify us should she develop any progressive symptoms and was reassured that this is not life or limb-threatening.    Rosetta Posner, M.D. Electronically Signed  TFE/MEDQ  D:  09/27/2010  T:  09/27/2010  Job:  [id number]  cc:   Hilliard Clark A. Loanne Drilling, MD

## 2010-09-29 NOTE — Procedures (Unsigned)
LOWER EXTREMITY VENOUS REFLUX EXAM  INDICATION:  Varicose veins and stasis dermatitis.  EXAM:  Using color-flow imaging and pulse Doppler spectral analysis, the bilateral common femoral, superficial femoral, popliteal, posterior tibial, greater and lesser saphenous veins are evaluated.  There is evidence suggesting deep venous insufficiency in the bilateral lower extremities.  The right saphenofemoral junction is not competent with Reflux of >555mlliseconds. The left saphenofemoral junction is competent.  The bilateral GSV's are not competent with Reflux of >5080mliseconds with the calibers as described below.  The bilateral proximal short saphenous veins demonstrate competency.  GSV Diameter (used if found to be incompetent only)                                                  Right      Left Proximal Greater Saphenous Vein                  0.79 cm    0.75 cm Proximal-to-mid-thigh                            0.47 cm    0.51 cm Mid thigh                                        0.45 cm    0.38 cm Mid-distal thigh                                   cm         cm Distal thigh                                     0.38 cm    0.44 cm Knee                                             0.31 cm    0.21 cm  IMPRESSION: 1. The bilateral greater saphenous veins are not competent with reflux     >50031miseconds. 2. The bilateral greater saphenous veins are tortuous. 3. The bilateral deep venous systems are not competent with Reflux of     >500m54mseconds. 4. The bilateral small saphenous veins are competent.  ___________________________________________ ToddRosetta PosnerD.  SH/MEDQ  D:  09/27/2010  T:  09/27/2010  Job:  [id number]

## 2010-11-12 ENCOUNTER — Other Ambulatory Visit: Payer: Self-pay | Admitting: Endocrinology

## 2011-03-09 ENCOUNTER — Other Ambulatory Visit (INDEPENDENT_AMBULATORY_CARE_PROVIDER_SITE_OTHER): Payer: BC Managed Care – PPO

## 2011-03-09 ENCOUNTER — Ambulatory Visit (INDEPENDENT_AMBULATORY_CARE_PROVIDER_SITE_OTHER): Payer: BC Managed Care – PPO | Admitting: Endocrinology

## 2011-03-09 ENCOUNTER — Encounter: Payer: Self-pay | Admitting: Endocrinology

## 2011-03-09 VITALS — BP 124/94 | HR 92 | Temp 98.4°F | Ht 62.0 in | Wt 234.0 lb

## 2011-03-09 DIAGNOSIS — E119 Type 2 diabetes mellitus without complications: Secondary | ICD-10-CM

## 2011-03-09 LAB — HEMOGLOBIN A1C: Hgb A1c MFr Bld: 6.4 % (ref 4.6–6.5)

## 2011-03-09 MED ORDER — DOXYCYCLINE HYCLATE 100 MG PO TABS: 100.0000 mg | ORAL_TABLET | Freq: Two times a day (BID) | ORAL | Status: DC

## 2011-03-09 MED ORDER — DOXYCYCLINE HYCLATE 100 MG PO TABS: 100.0000 mg | ORAL_TABLET | Freq: Two times a day (BID) | ORAL | Status: AC

## 2011-03-09 NOTE — Patient Instructions (Addendum)
i have sent a prescription to your pharmacy, for an antibiotic pill. Try applying the triamcinolone cream to the spots, 3x a day, until they are better.   I hope you feel better soon.  If you don't feel better in 10 days, please call back.   blood tests are being requested for you today.  please call [phone] to hear your test results.  You will be prompted to enter the 9-digit "MRN" number that appears at the top left of this page, followed by #.  Then you will hear the message. Please come back for a regular physical appointment in 6 months. (update: i left message on phone-tree:  rx as we discussed)

## 2011-03-09 NOTE — Progress Notes (Signed)
  Subjective:    Patient ID: Jennifer Saunders, female    DOB: November 17, 1950, 60 y.o.   MRN: [id number]  HPI Pt state 1 week of "spots on the skin," at the left flank and both legs.  There is assoc pain and itching Past Medical History  Diagnosis Date  . Anxiety   . Diabetes mellitus   . Hypertension   . Dyslipidemia   . Urolithiasis     No past surgical history on file.  History   Social History  . Marital Status: Married    Spouse Name: N/A    Number of Children: N/A  . Years of Education: N/A   Occupational History  . Not on file.   Social History Main Topics  . Smoking status: Never Smoker   . Smokeless tobacco: Not on file  . Alcohol Use: No  . Drug Use: No  . Sexually Active:    Other Topics Concern  . Not on file   Social History Narrative  . No narrative on file    Current Outpatient Prescriptions on File Prior to Visit  Medication Sig Dispense Refill  . amLODipine (NORVASC) 5 MG tablet Take 1 tablet (5 mg total) by mouth daily.  30 tablet  2  . aspirin 81 MG tablet Take 81 mg by mouth daily.        . carvedilol (COREG) 12.5 MG tablet Take 1 tablet (12.5 mg total) by mouth 2 (two) times daily with a meal.  60 tablet  2  . ezetimibe-simvastatin (VYTORIN) 10-80 MG per tablet Take 1 tablet by mouth at bedtime.        Marland Kitchen losartan-hydrochlorothiazide (HYZAAR) 100-25 MG per tablet Take 1 tablet by mouth daily.  30 tablet  11  . metFORMIN (GLUCOPHAGE-XR) 500 MG 24 hr tablet Take 2 tablets by mouth two times a day  360 tablet  2  . potassium chloride (K-DUR,KLOR-CON) 10 MEQ tablet TAKE 1 TABLET DAILY  90 tablet  2  . potassium chloride (KLOR-CON) 10 MEQ CR tablet Take 10 mEq by mouth daily.        Marland Kitchen triamcinolone (KENALOG) 0.025 % cream 3x a day as needed for itching  30 g  0    No Known Allergies  Family History  Problem Relation Age of Onset  . Cancer Mother     Breast  . Heart disease Mother     CVA x 2  . Cancer Sister     Colon  . Aneurysm Sister    CNS  . Intracerebral hemorrhage Sister     BP 124/94  Pulse 92  Temp(Src) 98.4 F (36.9 C) (Oral)  Ht 5\' 2"  (1.575 m)  Wt 234 lb (106.142 kg)  BMI 42.80 kg/m2  SpO2 99%  Review of Systems Denies fever    Objective:   Physical Exam VITAL SIGNS:  See vs page GENERAL: no distress.  Obese Skin: there are a total of approx 10 pustules at the se areas.  They do not appear to be vesicles.     Lab Results  Component Value Date   HGBA1C 6.4 03/09/2011      Assessment & Plan:  Dm, well-controlled Rash, new, uncertain etiology

## 2011-05-09 ENCOUNTER — Other Ambulatory Visit: Payer: Self-pay | Admitting: Endocrinology

## 2011-05-14 ENCOUNTER — Other Ambulatory Visit: Payer: Self-pay

## 2011-05-14 MED ORDER — TRIAMCINOLONE ACETONIDE 0.025 % EX CREA: TOPICAL_CREAM | CUTANEOUS | Status: DC

## 2011-05-14 NOTE — Telephone Encounter (Signed)
Pt advised of Rx/pharmacy 

## 2011-05-14 NOTE — Telephone Encounter (Signed)
Pt called requesting refill of steroid cream for use on her legs. Pt c/o small, red, itchy blister. Not painful.

## 2011-07-16 ENCOUNTER — Encounter: Payer: Self-pay | Admitting: Gastroenterology

## 2011-07-18 ENCOUNTER — Other Ambulatory Visit: Payer: Self-pay | Admitting: Endocrinology

## 2011-08-05 ENCOUNTER — Other Ambulatory Visit: Payer: Self-pay | Admitting: Endocrinology

## 2011-10-07 ENCOUNTER — Other Ambulatory Visit: Payer: Self-pay | Admitting: Endocrinology

## 2011-10-13 ENCOUNTER — Other Ambulatory Visit: Payer: Self-pay | Admitting: Endocrinology

## 2011-10-15 ENCOUNTER — Other Ambulatory Visit: Payer: Self-pay | Admitting: Endocrinology

## 2011-10-15 DIAGNOSIS — Z1231 Encounter for screening mammogram for malignant neoplasm of breast: Secondary | ICD-10-CM

## 2011-11-02 ENCOUNTER — Ambulatory Visit: Payer: BC Managed Care – PPO | Admitting: Endocrinology

## 2011-11-02 ENCOUNTER — Ambulatory Visit
Admission: RE | Admit: 2011-11-02 | Discharge: 2011-11-02 | Disposition: A | Discharge status: Home / Self Care | Payer: 59 | Source: Ambulatory Visit | Attending: Endocrinology | Admitting: Endocrinology

## 2011-11-02 DIAGNOSIS — Z1231 Encounter for screening mammogram for malignant neoplasm of breast: Secondary | ICD-10-CM

## 2011-12-11 ENCOUNTER — Telehealth: Payer: Self-pay | Admitting: *Deleted

## 2011-12-11 DIAGNOSIS — E119 Type 2 diabetes mellitus without complications: Secondary | ICD-10-CM

## 2011-12-11 DIAGNOSIS — Z0000 Encounter for general adult medical examination without abnormal findings: Secondary | ICD-10-CM

## 2011-12-11 NOTE — Telephone Encounter (Signed)
Received staff msg pt made cpx for August. Needing labs entered... 12/11/11@4 :0pm/LMB

## 2011-12-11 NOTE — Telephone Encounter (Signed)
Message copied by Deatra James on Tue Dec 11, 2011  3:59 PM ------      Message from: Burnett Harry      Created: Tue Dec 04, 2011  4:38 PM      Regarding: CPE 12/27/2011       [facility]

## 2011-12-24 ENCOUNTER — Other Ambulatory Visit (INDEPENDENT_AMBULATORY_CARE_PROVIDER_SITE_OTHER): Payer: 59

## 2011-12-24 DIAGNOSIS — E119 Type 2 diabetes mellitus without complications: Secondary | ICD-10-CM

## 2011-12-24 DIAGNOSIS — Z0000 Encounter for general adult medical examination without abnormal findings: Secondary | ICD-10-CM

## 2011-12-24 LAB — URINALYSIS, ROUTINE W REFLEX MICROSCOPIC
Bilirubin Urine: NEGATIVE
Ketones, ur: NEGATIVE
Leukocytes, UA: NEGATIVE
Urobilinogen, UA: 0.2 (ref 0.0–1.0)

## 2011-12-24 LAB — HEMOGLOBIN A1C: Hgb A1c MFr Bld: 6.1 % (ref 4.6–6.5)

## 2011-12-24 LAB — HEPATIC FUNCTION PANEL
ALT: 34 U/L (ref 0–35)
Alkaline Phosphatase: 83 U/L (ref 39–117)
Bilirubin, Direct: 0.1 mg/dL (ref 0.0–0.3)
Total Protein: 6.8 g/dL (ref 6.0–8.3)

## 2011-12-24 LAB — TSH: TSH: 2.44 u[IU]/mL (ref 0.35–5.50)

## 2011-12-24 LAB — CBC WITH DIFFERENTIAL/PLATELET
Basophils Relative: 0.2 % (ref 0.0–3.0)
Eosinophils Relative: 3.8 % (ref 0.0–5.0)
Lymphocytes Relative: 33.7 % (ref 12.0–46.0)
MCV: 93.2 fl (ref 78.0–100.0)
Neutrophils Relative %: 56.1 % (ref 43.0–77.0)
RBC: 4.58 Mil/uL (ref 3.87–5.11)
WBC: 9.2 10*3/uL (ref 4.5–10.5)

## 2011-12-24 LAB — BASIC METABOLIC PANEL
BUN: 14 mg/dL (ref 6–23)
Creatinine, Ser: 0.8 mg/dL (ref 0.4–1.2)
GFR: 93.13 mL/min (ref >60.00)

## 2011-12-24 LAB — LIPID PANEL: VLDL: 8.6 mg/dL (ref 0.0–40.0)

## 2011-12-27 ENCOUNTER — Ambulatory Visit (INDEPENDENT_AMBULATORY_CARE_PROVIDER_SITE_OTHER): Payer: 59 | Admitting: Endocrinology

## 2011-12-27 ENCOUNTER — Encounter: Payer: Self-pay | Admitting: Endocrinology

## 2011-12-27 VITALS — BP 138/88 | HR 85 | Temp 98.7°F | Ht 63.0 in | Wt 228.0 lb

## 2011-12-27 DIAGNOSIS — E119 Type 2 diabetes mellitus without complications: Secondary | ICD-10-CM

## 2011-12-27 MED ORDER — AMLODIPINE BESYLATE 5 MG PO TABS: ORAL_TABLET | ORAL | Status: DC

## 2011-12-27 MED ORDER — POTASSIUM CHLORIDE CRYS ER 10 MEQ PO TBCR: EXTENDED_RELEASE_TABLET | ORAL | Status: DC

## 2011-12-27 MED ORDER — EZETIMIBE-SIMVASTATIN 10-80 MG PO TABS: 1.0000 | ORAL_TABLET | Freq: Every day | ORAL | Status: DC

## 2011-12-27 MED ORDER — METFORMIN HCL ER 500 MG PO TB24: ORAL_TABLET | ORAL | Status: DC

## 2011-12-27 MED ORDER — LOSARTAN POTASSIUM-HCTZ 100-25 MG PO TABS: ORAL_TABLET | ORAL | Status: DC

## 2011-12-27 MED ORDER — CARVEDILOL 12.5 MG PO TABS: ORAL_TABLET | ORAL | Status: DC

## 2011-12-27 NOTE — Patient Instructions (Addendum)
please consider these measures for your health:  minimize alcohol.  do not use tobacco products.  have a colonoscopy at least every 10 years from age 63.  Women should have an annual mammogram from age 40.  keep firearms safely stored.  always use seat belts.  have working smoke alarms in your home.  see an eye doctor and dentist regularly.  never drive under the influence of alcohol or drugs (including prescription drugs).   Please return in 1 year. 

## 2011-12-27 NOTE — Progress Notes (Signed)
Subjective:    Patient ID: Jennifer Saunders, female    DOB: Feb 26, 1951, 61 y.o.   MRN: [id number]  HPI here for regular wellness examination.  she's feeling pretty well in general, and says chronic med probs are stable, except as noted below Past Medical History  Diagnosis Date  . Anxiety   . Diabetes mellitus   . Hypertension   . Dyslipidemia   . Urolithiasis     No past surgical history on file.  History   Social History  . Marital Status: Married    Spouse Name: N/A    Number of Children: N/A  . Years of Education: N/A   Occupational History  . Not on file.   Social History Main Topics  . Smoking status: Never Smoker   . Smokeless tobacco: Not on file  . Alcohol Use: No  . Drug Use: No  . Sexually Active:    Other Topics Concern  . Not on file   Social History Narrative  . No narrative on file    Current Outpatient Prescriptions on File Prior to Visit  Medication Sig Dispense Refill  . amLODipine (NORVASC) 5 MG tablet TAKE 1 TABLET DAILY  90 tablet  3  . aspirin 81 MG tablet Take 81 mg by mouth daily.        . carvedilol (COREG) 12.5 MG tablet TAKE 1 TABLET TWICE A DAY WITH MEALS  180 tablet  3  . ezetimibe-simvastatin (VYTORIN) 10-80 MG per tablet Take 1 tablet by mouth at bedtime.  90 tablet  3  . losartan-hydrochlorothiazide (HYZAAR) 100-25 MG per tablet TAKE 1 TABLET DAILY  90 tablet  3  . metFORMIN (GLUCOPHAGE-XR) 500 MG 24 hr tablet TAKE 2 TABLETS TWICE A DAY  360 tablet  3  . potassium chloride (K-DUR,KLOR-CON) 10 MEQ tablet TAKE 1 TABLET DAILY  90 tablet  3  . triamcinolone (KENALOG) 0.025 % cream 3x a day as needed for itching  30 g  0    No Known Allergies  Family History  Problem Relation Age of Onset  . Cancer Mother     Breast  . Heart disease Mother     CVA x 2  . Cancer Sister     Colon  . Aneurysm Sister     CNS  . Intracerebral hemorrhage Sister     BP 138/88  Pulse 85  Temp 98.7 F (37.1 C) (Oral)  Ht 5\' 3"  (1.6 m)  Wt 228  lb (103.42 kg)  BMI 40.39 kg/m2  SpO2 97%     Review of Systems  Constitutional: Negative for fever and unexpected weight change.  HENT: Negative for hearing loss.   Eyes: Negative for visual disturbance.  Respiratory: Negative for shortness of breath.   Cardiovascular: Negative for chest pain.  Gastrointestinal: Negative for anal bleeding.  Genitourinary: Negative for hematuria.  Musculoskeletal: Negative for back pain.  Skin: Negative for rash.  Neurological: Negative for syncope, numbness and headaches.  Hematological: Does not bruise/bleed easily.  Psychiatric/Behavioral: Negative for dysphoric mood.       Objective:   Physical Exam VS: see vs page GEN: no distress HEAD: head: no deformity eyes: no periorbital swelling, no proptosis external nose and ears are normal mouth: no lesion seen NECK: supple, thyroid is not enlarged CHEST WALL: no deformity LUNGS:  Clear to auscultation BREASTS:  No mass.  No d/c CV: reg rate and rhythm, no murmur ABD: abdomen is soft, nontender.  no hepatosplenomegaly.  not distended.  no  hernia GENITALIA:  Normal external female.  Normal bimanual exam RECTAL: normal external and internal exam.  heme neg MUSCULOSKELETAL: muscle bulk and strength are grossly normal.  no obvious joint swelling.  gait is normal and steady EXTEMITIES: no deformity.  no ulcer on the feet.  feet are of normal color and temp.  no edema PULSES: dorsalis pedis intact bilat.  no carotid bruit NEURO:  cn 2-12 grossly intact.   readily moves all 4's.  sensation is intact to touch on the feet SKIN:  Normal texture and temperature.  No rash or suspicious lesion is visible.   NODES:  None palpable at the neck PSYCH: alert, oriented x3.  Does not appear anxious nor depressed. Lab Results  Component Value Date   WBC 9.2 12/24/2011   HGB 13.6 12/24/2011   HCT 42.7 12/24/2011   PLT 260.0 12/24/2011   GLUCOSE 99 12/24/2011   CHOL 107 12/24/2011   TRIG 43.0 12/24/2011   HDL  44.10 12/24/2011   LDLCALC 54 12/24/2011   ALT 34 12/24/2011   AST 25 12/24/2011   NA 141 12/24/2011   K 4.4 12/24/2011   CL 106 12/24/2011   CREATININE 0.8 12/24/2011   BUN 14 12/24/2011   CO2 26 12/24/2011   TSH 2.44 12/24/2011   HGBA1C 6.1 12/24/2011   MICROALBUR 0.5 05/19/2009      Assessment & Plan:  Wellness visit today, with problems stable, except as noted.

## 2012-01-01 ENCOUNTER — Telehealth: Payer: Self-pay | Admitting: Endocrinology

## 2012-01-01 MED ORDER — EZETIMIBE-SIMVASTATIN 10-20 MG PO TABS: 1.0000 | ORAL_TABLET | Freq: Every day | ORAL | Status: AC

## 2012-01-01 NOTE — Telephone Encounter (Signed)
please call patient: Due to an interaction with your amlodipine, reduce the vytorin to 10/20.  i have sent a prescription to your pharmacy.  You can use-up your current ones by cutting in 1/2

## 2012-01-01 NOTE — Telephone Encounter (Signed)
Pt daughter notified   

## 2012-06-12 ENCOUNTER — Other Ambulatory Visit: Payer: Self-pay | Admitting: Endocrinology

## 2012-08-06 ENCOUNTER — Encounter: Payer: Self-pay | Admitting: Gastroenterology

## 2012-09-08 ENCOUNTER — Ambulatory Visit (AMBULATORY_SURGERY_CENTER): Payer: 59 | Admitting: *Deleted

## 2012-09-08 VITALS — Ht 62.0 in | Wt 224.2 lb

## 2012-09-08 DIAGNOSIS — Z1211 Encounter for screening for malignant neoplasm of colon: Secondary | ICD-10-CM

## 2012-09-08 MED ORDER — MOVIPREP 100 G PO SOLR: ORAL | Status: DC

## 2012-09-09 ENCOUNTER — Encounter: Payer: Self-pay | Admitting: Gastroenterology

## 2012-09-13 ENCOUNTER — Other Ambulatory Visit: Payer: Self-pay | Admitting: Endocrinology

## 2012-09-15 ENCOUNTER — Other Ambulatory Visit: Payer: Self-pay | Admitting: *Deleted

## 2012-09-15 MED ORDER — POTASSIUM CHLORIDE CRYS ER 10 MEQ PO TBCR: EXTENDED_RELEASE_TABLET | ORAL | Status: DC

## 2012-09-15 MED ORDER — METFORMIN HCL ER 500 MG PO TB24: ORAL_TABLET | ORAL | Status: DC

## 2012-09-23 ENCOUNTER — Other Ambulatory Visit: Payer: Self-pay | Admitting: Gastroenterology

## 2012-09-23 ENCOUNTER — Ambulatory Visit (AMBULATORY_SURGERY_CENTER): Payer: 59 | Admitting: Gastroenterology

## 2012-09-23 ENCOUNTER — Encounter: Payer: Self-pay | Admitting: Gastroenterology

## 2012-09-23 VITALS — BP 116/72 | HR 51 | Temp 97.6°F | Resp 15 | Ht 62.0 in | Wt 224.0 lb

## 2012-09-23 DIAGNOSIS — Z86010 Personal history of colonic polyps: Secondary | ICD-10-CM

## 2012-09-23 DIAGNOSIS — Z1211 Encounter for screening for malignant neoplasm of colon: Secondary | ICD-10-CM

## 2012-09-23 DIAGNOSIS — Z800 Family history of malignant neoplasm of digestive organs: Secondary | ICD-10-CM

## 2012-09-23 DIAGNOSIS — D126 Benign neoplasm of colon, unspecified: Secondary | ICD-10-CM

## 2012-09-23 MED ORDER — SODIUM CHLORIDE 0.9 % IV SOLN: 500.0000 mL | INTRAVENOUS | Status: DC

## 2012-09-23 NOTE — Progress Notes (Signed)
Patient did not experience any of the following events: a burn prior to discharge; a fall within the facility; wrong site/side/patient/procedure/implant event; or a hospital transfer or hospital admission upon discharge from the facility. (G8907) Patient did not have preoperative order for IV antibiotic SSI prophylaxis. (G8918)  

## 2012-09-23 NOTE — Patient Instructions (Addendum)

## 2012-09-23 NOTE — Op Note (Addendum)
[facility] [address] [place], [address]   COLONOSCOPY PROCEDURE REPORT  PATIENT: Jennifer Saunders, Jennifer Saunders  MR#: [id number] BIRTHDATE: 11-28-1950 , 61  yrs. old GENDER: Female ENDOSCOPIST: Meryl Dare, MD, [facility] PROCEDURE DATE:  09/23/2012 PROCEDURE:   Colonoscopy with biopsy ASA CLASS:   Class II INDICATIONS:Patient's personal history of adenomatous colon polyps and patient's immediate family history of colon cancer. MEDICATIONS: MAC sedation, administered by CRNA and propofol (Diprivan) 150mg  IV DESCRIPTION OF PROCEDURE:   After the risks benefits and alternatives of the procedure were thoroughly explained, informed consent was obtained.  A digital rectal exam revealed no abnormalities of the rectum.   The LB [id number]  endoscope was introduced through the anus and advanced to the cecum, which was identified by both the appendix and ileocecal valve. No adverse events experienced.   The quality of the prep was excellent, using MoviPrep  The instrument was then slowly withdrawn as the colon was fully examined.  COLON FINDINGS: A sessile polyp measuring 4 mm in size was found in the transverse colon.  A polypectomy was performed with cold forceps.  The resection was complete and the polyp tissue was completely retrieved.   The colon was otherwise normal.  There was no diverticulosis, inflammation, polyps or cancers unless previously stated.  Retroflexed views revealed internal hemorrhoids. The time to cecum=1 minutes 16 seconds.  Withdrawal time=13 minutes 35 seconds.  The scope was withdrawn and the procedure completed.  COMPLICATIONS: There were no complications.  ENDOSCOPIC IMPRESSION: 1.   Sessile polyp measuring 4 mm in the transverse colon; polypectomy performed with cold forceps 2.   The colon was otherwise normal  RECOMMENDATIONS: 1.  Await pathology results 2.  Repeat Colonoscopy in 5 years.   eSigned:  Meryl Dare, MD, [facility]  09/23/2012 10:01 AM

## 2012-09-23 NOTE — Progress Notes (Signed)
Called to room to assist during endoscopic procedure.  Patient ID and intended procedure confirmed with present staff. Received instructions for my participation in the procedure from the performing physician.  

## 2012-09-23 NOTE — Progress Notes (Signed)
Lidocaine-40mg IV prior to Propofol InductionPropofol given over incremental dosages 

## 2012-09-24 ENCOUNTER — Telehealth: Payer: Self-pay

## 2012-09-24 NOTE — Telephone Encounter (Signed)
The pt had gone to work per her daughter, Debbe Odea.  Maw

## 2012-09-30 ENCOUNTER — Encounter: Payer: Self-pay | Admitting: Gastroenterology

## 2012-11-04 ENCOUNTER — Other Ambulatory Visit: Payer: Self-pay

## 2012-11-04 DIAGNOSIS — Z1231 Encounter for screening mammogram for malignant neoplasm of breast: Secondary | ICD-10-CM

## 2012-11-18 ENCOUNTER — Telehealth: Payer: Self-pay | Admitting: *Deleted

## 2012-11-18 NOTE — Telephone Encounter (Signed)
Please advise cpx on or after 12/26/12.

## 2012-11-18 NOTE — Telephone Encounter (Signed)
Called patient to let her know last A1c was 12/24/11. Patient thought that she had more labs done since then. Patient wanted to know if recall any recent ones? Explained to patient that there are none in chart but patient wanted to check with you. Please advise?

## 2012-11-18 NOTE — Telephone Encounter (Signed)
Patient left vm requesting her A1C results.

## 2012-11-20 NOTE — Telephone Encounter (Signed)
Pt advised and states she will call back for an appt

## 2012-11-21 ENCOUNTER — Ambulatory Visit
Admission: RE | Admit: 2012-11-21 | Discharge: 2012-11-21 | Disposition: A | Discharge status: Home / Self Care | Payer: 59 | Source: Ambulatory Visit

## 2012-11-21 DIAGNOSIS — Z1231 Encounter for screening mammogram for malignant neoplasm of breast: Secondary | ICD-10-CM

## 2012-12-08 ENCOUNTER — Telehealth: Payer: Self-pay

## 2012-12-08 DIAGNOSIS — E7800 Pure hypercholesterolemia, unspecified: Secondary | ICD-10-CM

## 2012-12-08 DIAGNOSIS — E119 Type 2 diabetes mellitus without complications: Secondary | ICD-10-CM

## 2012-12-08 DIAGNOSIS — Z79899 Other long term (current) drug therapy: Secondary | ICD-10-CM

## 2012-12-08 DIAGNOSIS — I10 Essential (primary) hypertension: Secondary | ICD-10-CM

## 2012-12-08 NOTE — Telephone Encounter (Signed)
Pt has appt on 12/17/12, would like to come 3 days prior and get labs done, please advise

## 2012-12-08 NOTE — Telephone Encounter (Signed)
Ok, i ordered 

## 2012-12-09 NOTE — Telephone Encounter (Signed)
Pt's dtr advised

## 2012-12-12 ENCOUNTER — Other Ambulatory Visit: Payer: Self-pay

## 2012-12-12 ENCOUNTER — Other Ambulatory Visit (INDEPENDENT_AMBULATORY_CARE_PROVIDER_SITE_OTHER): Payer: 59

## 2012-12-12 DIAGNOSIS — Z0000 Encounter for general adult medical examination without abnormal findings: Secondary | ICD-10-CM

## 2012-12-12 LAB — LIPID PANEL
Cholesterol: 107 mg/dL (ref 0–200)
HDL: 35.3 mg/dL — ABNORMAL LOW (ref >39.00)
Triglycerides: 56 mg/dL (ref 0.0–149.0)

## 2012-12-12 LAB — URINALYSIS, ROUTINE W REFLEX MICROSCOPIC
Bilirubin Urine: NEGATIVE
Ketones, ur: NEGATIVE
Total Protein, Urine: NEGATIVE
Urine Glucose: NEGATIVE
pH: 6 (ref 5.0–8.0)

## 2012-12-12 LAB — HEPATIC FUNCTION PANEL
ALT: 24 U/L (ref 0–35)
Alkaline Phosphatase: 73 U/L (ref 39–117)
Bilirubin, Direct: 0 mg/dL (ref 0.0–0.3)
Total Protein: 6.8 g/dL (ref 6.0–8.3)

## 2012-12-12 LAB — BASIC METABOLIC PANEL
BUN: 16 mg/dL (ref 6–23)
Chloride: 105 mEq/L (ref 96–112)
Creatinine, Ser: 0.8 mg/dL (ref 0.4–1.2)

## 2012-12-12 LAB — CBC WITH DIFFERENTIAL/PLATELET
Basophils Relative: 0.4 % (ref 0.0–3.0)
Eosinophils Absolute: 0.3 10*3/uL (ref 0.0–0.7)
Eosinophils Relative: 3 % (ref 0.0–5.0)
Lymphocytes Relative: 36.1 % (ref 12.0–46.0)
Monocytes Relative: 5.6 % (ref 3.0–12.0)
Neutrophils Relative %: 54.9 % (ref 43.0–77.0)
RBC: 4.53 Mil/uL (ref 3.87–5.11)
WBC: 8.5 10*3/uL (ref 4.5–10.5)

## 2012-12-12 LAB — TSH: TSH: 1.13 u[IU]/mL (ref 0.35–5.50)

## 2012-12-17 ENCOUNTER — Ambulatory Visit (INDEPENDENT_AMBULATORY_CARE_PROVIDER_SITE_OTHER): Payer: 59 | Admitting: Endocrinology

## 2012-12-17 ENCOUNTER — Encounter: Payer: Self-pay | Admitting: Endocrinology

## 2012-12-17 VITALS — BP 126/78 | HR 76 | Ht 61.0 in | Wt 213.0 lb

## 2012-12-17 DIAGNOSIS — Z0000 Encounter for general adult medical examination without abnormal findings: Secondary | ICD-10-CM

## 2012-12-17 DIAGNOSIS — R011 Cardiac murmur, unspecified: Secondary | ICD-10-CM | POA: Insufficient documentation

## 2012-12-17 DIAGNOSIS — N951 Menopausal and female climacteric states: Secondary | ICD-10-CM

## 2012-12-17 NOTE — Patient Instructions (Addendum)
please consider these measures for your health:  minimize alcohol.  do not use tobacco products.  have a colonoscopy at least every 10 years from age 64.  Women should have an annual mammogram from age 32.  keep firearms safely stored.  always use seat belts.  have working smoke alarms in your home.  see an eye doctor and dentist regularly.  never drive under the influence of alcohol or drugs (including prescription drugs).   Let's check an "echo" (heart) test.  It is easy. Please return in 1 year.

## 2012-12-17 NOTE — Progress Notes (Signed)
Subjective:    Patient ID: Jennifer Saunders, female    DOB: 04-Aug-1950, 62 y.o.   MRN: [id number]  HPI Pt is here for regular wellness examination, and is feeling pretty well in general, and says chronic med probs are stable, except as noted below Past Medical History  Diagnosis Date  . Anxiety   . Diabetes mellitus   . Hypertension   . Dyslipidemia   . Urolithiasis     Past Surgical History  Procedure Laterality Date  . No prior surgery      History   Social History  . Marital Status: Married    Spouse Name: N/A    Number of Children: N/A  . Years of Education: N/A   Occupational History  . Not on file.   Social History Main Topics  . Smoking status: Never Smoker   . Smokeless tobacco: Never Used  . Alcohol Use: No  . Drug Use: No  . Sexual Activity: Not on file   Other Topics Concern  . Not on file   Social History Narrative  . No narrative on file    Current Outpatient Prescriptions on File Prior to Visit  Medication Sig Dispense Refill  . amLODipine (NORVASC) 5 MG tablet TAKE 1 TABLET DAILY  90 tablet  3  . aspirin 81 MG tablet Take 81 mg by mouth daily.        . carvedilol (COREG) 12.5 MG tablet TAKE 1 TABLET TWICE A DAY WITH MEALS  180 tablet  3  . ezetimibe-simvastatin (VYTORIN) 10-20 MG per tablet Take 1 tablet by mouth at bedtime.  90 tablet  3  . losartan-hydrochlorothiazide (HYZAAR) 100-25 MG per tablet TAKE 1 TABLET DAILY  90 tablet  3  . metFORMIN (GLUCOPHAGE-XR) 500 MG 24 hr tablet TAKE 2 TABLETS TWICE A DAY  360 tablet  0  . potassium chloride (K-DUR,KLOR-CON) 10 MEQ tablet TAKE 1 TABLET DAILY  90 tablet  0  . triamcinolone (KENALOG) 0.025 % cream 3x a day as needed for itching  30 g  0   No current facility-administered medications on file prior to visit.    No Known Allergies  Family History  Problem Relation Age of Onset  . Cancer Mother     Breast  . Heart disease Mother     CVA x 2  . Cancer Sister     Colon  . Aneurysm Sister      CNS  . Intracerebral hemorrhage Sister   . Colon cancer Sister 50    BP 126/78  Pulse 76  Ht 5\' 1"  (1.549 m)  Wt 213 lb (96.616 kg)  BMI 40.27 kg/m2  SpO2 98%     Review of Systems  Constitutional: Negative for fever.  HENT: Negative for hearing loss.   Eyes: Negative for visual disturbance.  Respiratory: Negative for shortness of breath.   Gastrointestinal: Negative for anal bleeding.  Endocrine: Negative for cold intolerance.  Genitourinary: Negative for hematuria.  Musculoskeletal: Negative for back pain.  Skin: Negative for rash.  Allergic/Immunologic: Negative for environmental allergies.  Neurological: Negative for numbness.  Hematological: Does not bruise/bleed easily.  Psychiatric/Behavioral: Negative for dysphoric mood.       Objective:   Physical Exam VS: see vs page GEN: no distress HEAD: head: no deformity eyes: no periorbital swelling, no proptosis external nose and ears are normal mouth: no lesion seen NECK: supple, thyroid is not enlarged CHEST WALL: no deformity LUNGS:  Clear to auscultation BREASTS:  No mass.  No d/c ABD: abdomen is soft, nontender.  no hepatosplenomegaly.  not distended.  no hernia GENITALIA:  Normal external female.  Normal bimanual exam RECTAL: normal external and internal exam.  heme neg MUSCULOSKELETAL: muscle bulk and strength are grossly normal.  no obvious joint swelling.  gait is normal and steady EXTEMITIES: no deformity.  no ulcer on the feet.  feet are of normal color and temp.  no edema.  Large bilar varicosities. PULSES: dorsalis pedis intact bilat.  no carotid bruit NEURO:  cn 2-12 grossly intact.   readily moves all 4's.  sensation is intact to touch on the feet SKIN:  Normal texture and temperature.  No rash or suspicious lesion is visible.   NODES:  None palpable at the neck PSYCH: alert, oriented x3.  Does not appear anxious nor depressed.     Assessment & Plan:  Wellness visit today, with problems  stable, except as noted. we discussed code status.  pt requests full code, but would not want to be started or maintained on artificial life-support measures if there was not a reasonable chance of recovery    SEPARATE EVALUATION FOLLOWS--EACH PROBLEM HERE IS NEW, NOT RESPONDING TO TREATMENT, OR POSES SIGNIFICANT RISK TO THE PATIENT'S HEALTH: HISTORY OF THE PRESENT ILLNESS: Abnormal ecg is noted today.  She denies chest pain PAST MEDICAL HISTORY reviewed and up to date today REVIEW OF SYSTEMS: Denies LOC and weight change PHYSICAL EXAMINATION: VITAL SIGNS:  See vs page GENERAL: no distress CV: reg rate and rhythm; soft to moderate systolic murmur LAB/XRAY RESULTS: i reviewed electrocardiogram. IMPRESSION: Abnormal ecg, new, uncertain etiology PLAN: See instruction page

## 2012-12-18 ENCOUNTER — Telehealth: Payer: Self-pay

## 2012-12-18 NOTE — Telephone Encounter (Signed)
Pt given bone density appointment appt for 12/23/12 at 10:30

## 2012-12-23 ENCOUNTER — Other Ambulatory Visit: Payer: 59

## 2012-12-23 ENCOUNTER — Other Ambulatory Visit (HOSPITAL_COMMUNITY): Payer: Self-pay | Admitting: Endocrinology

## 2012-12-23 DIAGNOSIS — R011 Cardiac murmur, unspecified: Secondary | ICD-10-CM

## 2012-12-26 ENCOUNTER — Other Ambulatory Visit (HOSPITAL_COMMUNITY): Payer: 59

## 2013-01-04 ENCOUNTER — Other Ambulatory Visit: Payer: Self-pay | Admitting: Endocrinology

## 2013-01-06 ENCOUNTER — Other Ambulatory Visit: Payer: 59

## 2013-01-08 ENCOUNTER — Other Ambulatory Visit: Payer: Self-pay | Admitting: *Deleted

## 2013-01-08 ENCOUNTER — Other Ambulatory Visit: Payer: Self-pay | Admitting: Endocrinology

## 2013-01-08 MED ORDER — LOSARTAN POTASSIUM-HCTZ 100-25 MG PO TABS: ORAL_TABLET | ORAL | Status: DC

## 2013-01-13 ENCOUNTER — Ambulatory Visit (INDEPENDENT_AMBULATORY_CARE_PROVIDER_SITE_OTHER)
Admission: RE | Admit: 2013-01-13 | Discharge: 2013-01-13 | Disposition: A | Discharge status: Home / Self Care | Payer: 59 | Source: Ambulatory Visit | Attending: Endocrinology | Admitting: Endocrinology

## 2013-01-13 ENCOUNTER — Ambulatory Visit (HOSPITAL_COMMUNITY): Payer: 59 | Attending: Cardiology | Admitting: Radiology

## 2013-01-13 DIAGNOSIS — F411 Generalized anxiety disorder: Secondary | ICD-10-CM | POA: Insufficient documentation

## 2013-01-13 DIAGNOSIS — I10 Essential (primary) hypertension: Secondary | ICD-10-CM | POA: Insufficient documentation

## 2013-01-13 DIAGNOSIS — E669 Obesity, unspecified: Secondary | ICD-10-CM | POA: Insufficient documentation

## 2013-01-13 DIAGNOSIS — E119 Type 2 diabetes mellitus without complications: Secondary | ICD-10-CM | POA: Insufficient documentation

## 2013-01-13 DIAGNOSIS — E785 Hyperlipidemia, unspecified: Secondary | ICD-10-CM | POA: Insufficient documentation

## 2013-01-13 DIAGNOSIS — N951 Menopausal and female climacteric states: Secondary | ICD-10-CM

## 2013-01-13 DIAGNOSIS — Z6841 Body Mass Index (BMI) 40.0 and over, adult: Secondary | ICD-10-CM | POA: Insufficient documentation

## 2013-01-13 DIAGNOSIS — R011 Cardiac murmur, unspecified: Secondary | ICD-10-CM

## 2013-01-13 DIAGNOSIS — R42 Dizziness and giddiness: Secondary | ICD-10-CM | POA: Insufficient documentation

## 2013-01-13 NOTE — Progress Notes (Signed)
Echocardiogram performed.  

## 2013-01-15 ENCOUNTER — Other Ambulatory Visit: Payer: Self-pay | Admitting: Endocrinology

## 2013-01-16 ENCOUNTER — Other Ambulatory Visit: Payer: Self-pay

## 2013-01-16 MED ORDER — METFORMIN HCL ER 500 MG PO TB24: ORAL_TABLET | ORAL | Status: DC

## 2013-05-05 ENCOUNTER — Other Ambulatory Visit: Payer: Self-pay | Admitting: Endocrinology

## 2013-06-05 ENCOUNTER — Encounter: Payer: Self-pay | Admitting: Endocrinology

## 2013-06-05 ENCOUNTER — Ambulatory Visit (INDEPENDENT_AMBULATORY_CARE_PROVIDER_SITE_OTHER): Payer: 59 | Admitting: Endocrinology

## 2013-06-05 VITALS — BP 124/86 | HR 94 | Temp 98.2°F | Ht 61.0 in | Wt 213.0 lb

## 2013-06-05 DIAGNOSIS — M25569 Pain in unspecified knee: Secondary | ICD-10-CM

## 2013-06-05 DIAGNOSIS — M25562 Pain in left knee: Secondary | ICD-10-CM

## 2013-06-05 MED ORDER — CELECOXIB 200 MG PO CAPS: 200.0000 mg | ORAL_CAPSULE | Freq: Every day | ORAL | Status: DC

## 2013-06-05 NOTE — Progress Notes (Signed)
   Subjective:    Patient ID: Jennifer Saunders, female    DOB: 1951-03-30, 63 y.o.   MRN: [id number]  HPI Pt states 1 week of slight pain at the posterior aspect of the left knee, which started in the context of a new exercise program.  She has assoc swelling there.   Past Medical History  Diagnosis Date  . Anxiety   . Diabetes mellitus   . Hypertension   . Dyslipidemia   . Urolithiasis     Past Surgical History  Procedure Laterality Date  . No prior surgery      History   Social History  . Marital Status: Married    Spouse Name: N/A    Number of Children: N/A  . Years of Education: N/A   Occupational History  . Not on file.   Social History Main Topics  . Smoking status: Never Smoker   . Smokeless tobacco: Never Used  . Alcohol Use: No  . Drug Use: No  . Sexual Activity: Not on file   Other Topics Concern  . Not on file   Social History Narrative  . No narrative on file    Current Outpatient Prescriptions on File Prior to Visit  Medication Sig Dispense Refill  . amLODipine (NORVASC) 5 MG tablet TAKE 1 TABLET DAILY  90 tablet  2  . aspirin 81 MG tablet Take 81 mg by mouth daily.        . carvedilol (COREG) 12.5 MG tablet TAKE 1 TABLET TWICE A DAY (WITH MEALS)  180 tablet  2  . losartan-hydrochlorothiazide (HYZAAR) 100-25 MG per tablet TAKE 1 TABLET DAILY  90 tablet  3  . metFORMIN (GLUCOPHAGE-XR) 500 MG 24 hr tablet TAKE 2 TABLETS TWICE A DAY  360 tablet  0  . metFORMIN (GLUCOPHAGE-XR) 500 MG 24 hr tablet TAKE 2 TABLETS TWICE A DAY (NEED TO SCHEDULE A FOLLOW-UP APPOINTMENT WITH DR Loanne Drilling)  360 tablet  0  . potassium chloride (K-DUR,KLOR-CON) 10 MEQ tablet TAKE 1 TABLET DAILY  90 tablet  0  . triamcinolone (KENALOG) 0.025 % cream 3x a day as needed for itching  30 g  0   No current facility-administered medications on file prior to visit.    No Known Allergies  Family History  Problem Relation Age of Onset  . Cancer Mother     Breast  . Heart disease  Mother     CVA x 2  . Cancer Sister     Colon  . Aneurysm Sister     CNS  . Intracerebral hemorrhage Sister   . Colon cancer Sister 50    BP 124/86  Pulse 94  Temp(Src) 98.2 F (36.8 C) (Oral)  Ht 5\' 1"  (1.549 m)  Wt 213 lb (96.616 kg)  BMI 40.27 kg/m2  SpO2 94%  Review of Systems Denies fever and numbness.      Objective:   Physical Exam VITAL SIGNS:  See vs page GENERAL: no distress Left knee: no tend/erythema/warmth.  Slightly swollen. Gait: slightly favors LLE.      Assessment & Plan:  Knee pain, new, uncertain etiology

## 2013-06-05 NOTE — Patient Instructions (Signed)
i have sent a prescription to your pharmacy, for generic "celebrex." If this is expensive, instead take advil+prilosec. I hope you feel better soon.  If you don't feel better, please call back.

## 2013-06-08 ENCOUNTER — Telehealth: Payer: Self-pay | Admitting: Endocrinology

## 2013-06-08 NOTE — Telephone Encounter (Signed)
please call patient: Insurance declined celebrex Instead, please take advil and prilosec, one each per day.

## 2013-06-08 NOTE — Telephone Encounter (Signed)
Pt informed

## 2013-06-08 NOTE — Telephone Encounter (Signed)
Waiting on pt to call back.

## 2013-08-19 ENCOUNTER — Other Ambulatory Visit: Payer: Self-pay | Admitting: Endocrinology

## 2013-11-25 ENCOUNTER — Other Ambulatory Visit: Payer: Self-pay

## 2013-11-25 DIAGNOSIS — Z1231 Encounter for screening mammogram for malignant neoplasm of breast: Secondary | ICD-10-CM

## 2013-11-27 ENCOUNTER — Ambulatory Visit
Admission: RE | Admit: 2013-11-27 | Discharge: 2013-11-27 | Disposition: A | Discharge status: Home / Self Care | Payer: 59 | Source: Ambulatory Visit

## 2013-11-27 ENCOUNTER — Encounter (INDEPENDENT_AMBULATORY_CARE_PROVIDER_SITE_OTHER): Payer: Self-pay

## 2013-11-27 DIAGNOSIS — Z1231 Encounter for screening mammogram for malignant neoplasm of breast: Secondary | ICD-10-CM

## 2013-12-10 ENCOUNTER — Other Ambulatory Visit: Payer: Self-pay

## 2013-12-10 ENCOUNTER — Other Ambulatory Visit (INDEPENDENT_AMBULATORY_CARE_PROVIDER_SITE_OTHER): Payer: 59

## 2013-12-10 DIAGNOSIS — Z0000 Encounter for general adult medical examination without abnormal findings: Secondary | ICD-10-CM

## 2013-12-10 LAB — CBC
HCT: 42.5 % (ref 36.0–46.0)
Hemoglobin: 14 g/dL (ref 12.0–15.0)
MCHC: 32.9 g/dL (ref 30.0–36.0)
MCV: 90.7 fl (ref 78.0–100.0)
Platelets: 303 10*3/uL (ref 150.0–400.0)
RBC: 4.69 Mil/uL (ref 3.87–5.11)
RDW: 13.1 % (ref 11.5–15.5)
WBC: 9.2 10*3/uL (ref 4.0–10.5)

## 2013-12-10 LAB — URINALYSIS, ROUTINE W REFLEX MICROSCOPIC
Bilirubin Urine: NEGATIVE
Hgb urine dipstick: NEGATIVE
Ketones, ur: NEGATIVE
LEUKOCYTES UA: NEGATIVE
Nitrite: NEGATIVE
Specific Gravity, Urine: 1.01 (ref 1.000–1.030)
Total Protein, Urine: NEGATIVE
UROBILINOGEN UA: 0.2 (ref 0.0–1.0)
Urine Glucose: NEGATIVE
pH: 7.5 (ref 5.0–8.0)

## 2013-12-10 LAB — LIPID PANEL
CHOL/HDL RATIO: 3
Cholesterol: 124 mg/dL (ref 0–200)
HDL: 37 mg/dL — AB (ref >39.00)
LDL Cholesterol: 70 mg/dL (ref 0–99)
NonHDL: 87
Triglycerides: 85 mg/dL (ref 0.0–149.0)
VLDL: 17 mg/dL (ref 0.0–40.0)

## 2013-12-10 LAB — BASIC METABOLIC PANEL
BUN: 17 mg/dL (ref 6–23)
CO2: 30 mEq/L (ref 19–32)
Calcium: 10 mg/dL (ref 8.4–10.5)
Chloride: 102 mEq/L (ref 96–112)
Creatinine, Ser: 0.9 mg/dL (ref 0.4–1.2)
GFR: 84.01 mL/min (ref >60.00)
GLUCOSE: 97 mg/dL (ref 70–99)
POTASSIUM: 4 meq/L (ref 3.5–5.1)
Sodium: 139 mEq/L (ref 135–145)

## 2013-12-10 LAB — MICROALBUMIN / CREATININE URINE RATIO
Creatinine,U: 144.3 mg/dL
MICROALB/CREAT RATIO: 0.4 mg/g (ref 0.0–30.0)
Microalb, Ur: 0.6 mg/dL (ref 0.0–1.9)

## 2013-12-10 LAB — HEMOGLOBIN A1C: HEMOGLOBIN A1C: 6.3 % (ref 4.6–6.5)

## 2013-12-10 LAB — TSH: TSH: 0.22 u[IU]/mL — AB (ref 0.35–4.50)

## 2013-12-15 ENCOUNTER — Ambulatory Visit (INDEPENDENT_AMBULATORY_CARE_PROVIDER_SITE_OTHER): Payer: 59 | Admitting: Endocrinology

## 2013-12-15 ENCOUNTER — Encounter: Payer: Self-pay | Admitting: Endocrinology

## 2013-12-15 VITALS — BP 124/84 | HR 90 | Temp 98.2°F | Ht 61.0 in | Wt 218.0 lb

## 2013-12-15 DIAGNOSIS — I10 Essential (primary) hypertension: Secondary | ICD-10-CM

## 2013-12-15 NOTE — Progress Notes (Signed)
Subjective:    Patient ID: Jennifer Saunders, female    DOB: Aug 09, 1950, 63 y.o.   MRN: [id number]  HPI Pt was noted last week to have a mildly suppressed TSH.  She denies neck swelling, or assoc pain.  She has no h/o thyroid problems.  Sister has an uncertain type of thyroid problem.   Past Medical History  Diagnosis Date  . Anxiety   . Diabetes mellitus   . Hypertension   . Dyslipidemia   . Urolithiasis     Past Surgical History  Procedure Laterality Date  . No prior surgery      History   Social History  . Marital Status: Married    Spouse Name: N/A    Number of Children: N/A  . Years of Education: N/A   Occupational History  . Not on file.   Social History Main Topics  . Smoking status: Never Smoker   . Smokeless tobacco: Never Used  . Alcohol Use: No  . Drug Use: No  . Sexual Activity: Not on file   Other Topics Concern  . Not on file   Social History Narrative  . No narrative on file    Current Outpatient Prescriptions on File Prior to Visit  Medication Sig Dispense Refill  . amLODipine (NORVASC) 5 MG tablet TAKE 1 TABLET DAILY  90 tablet  2  . aspirin 81 MG tablet Take 81 mg by mouth daily.        . carvedilol (COREG) 12.5 MG tablet TAKE 1 TABLET TWICE A DAY (WITH MEALS)  180 tablet  2  . celecoxib (CELEBREX) 200 MG capsule Take 1 capsule (200 mg total) by mouth daily.  30 capsule  3  . losartan-hydrochlorothiazide (HYZAAR) 100-25 MG per tablet TAKE 1 TABLET DAILY  90 tablet  3  . metFORMIN (GLUCOPHAGE-XR) 500 MG 24 hr tablet TAKE 2 TABLETS TWICE A DAY  360 tablet  0  . potassium chloride (K-DUR,KLOR-CON) 10 MEQ tablet TAKE 1 TABLET DAILY  90 tablet  0  . triamcinolone (KENALOG) 0.025 % cream 3x a day as needed for itching  30 g  0   No current facility-administered medications on file prior to visit.    No Known Allergies  Family History  Problem Relation Age of Onset  . Cancer Mother     Breast  . Heart disease Mother     CVA x 2  . Cancer  Sister     Colon  . Aneurysm Sister     CNS  . Intracerebral hemorrhage Sister   . Colon cancer Sister 50    BP 124/84  Pulse 90  Temp(Src) 98.2 F (36.8 C) (Oral)  Ht 5\' 1"  (1.549 m)  Wt 218 lb (98.884 kg)  BMI 41.21 kg/m2  SpO2 92%    Review of Systems She has gained weight.  Denies chest pain.    Objective:   Physical Exam VITAL SIGNS:  See vs page GENERAL: no distress NECK: There is no palpable thyroid enlargement.  No thyroid nodule is palpable.  No palpable lymphadenopathy at the anterior neck.   i reviewed electrocardiogram.    Lab Results  Component Value Date   TSH 0.22* 12/10/2013       Assessment & Plan:  Hyperthyroidism, new to me, mild.  This is usually due to multinodular goiter.  Pt declines thyroid ultrasound for now.   Weight gain, not thyroid-related.     Patient is advised the following: Patient Instructions  Please repeat  the thyroid blood test in approx 3 months.  You do not need to be fasting.   Please continue the same medications.  Please come in soon for a regular physical.

## 2013-12-15 NOTE — Patient Instructions (Addendum)
Please repeat the thyroid blood test in approx 3 months.  You do not need to be fasting.   Please continue the same medications.  Please come in soon for a regular physical.

## 2014-01-15 ENCOUNTER — Ambulatory Visit (INDEPENDENT_AMBULATORY_CARE_PROVIDER_SITE_OTHER): Payer: 59 | Admitting: Endocrinology

## 2014-01-15 ENCOUNTER — Encounter: Payer: Self-pay | Admitting: Endocrinology

## 2014-01-15 VITALS — BP 114/76 | HR 78 | Temp 97.8°F | Ht 61.0 in | Wt 220.0 lb

## 2014-01-15 DIAGNOSIS — R7989 Other specified abnormal findings of blood chemistry: Secondary | ICD-10-CM | POA: Insufficient documentation

## 2014-01-15 DIAGNOSIS — R946 Abnormal results of thyroid function studies: Secondary | ICD-10-CM

## 2014-01-15 DIAGNOSIS — Z23 Encounter for immunization: Secondary | ICD-10-CM

## 2014-01-15 LAB — TSH: TSH: 0.47 u[IU]/mL (ref 0.35–4.50)

## 2014-01-15 LAB — T4, FREE: Free T4: 1.11 ng/dL (ref 0.60–1.60)

## 2014-01-15 NOTE — Patient Instructions (Signed)
please consider these measures for your health:  minimize alcohol.  do not use tobacco products.  have a colonoscopy at least every 10 years from age 65.  Women should have an annual mammogram from age 101.  keep firearms safely stored.  always use seat belts.  have working smoke alarms in your home.  see an eye doctor and dentist regularly.  never drive under the influence of alcohol or drugs (including prescription drugs).   blood tests are being requested for you today.  We'll contact you with results. Please come back for a follow-up appointment in 6 months.

## 2014-01-16 NOTE — Progress Notes (Signed)
Subjective:    Patient ID: Jennifer Saunders, female    DOB: 06-27-1950, 63 y.o.   MRN: [id number]  HPI Pt is here for regular wellness examination, and is feeling pretty well in general, and says chronic med probs are stable, except as noted below Past Medical History  Diagnosis Date  . Anxiety   . Diabetes mellitus   . Hypertension   . Dyslipidemia   . Urolithiasis     Past Surgical History  Procedure Laterality Date  . No prior surgery      History   Social History  . Marital Status: Married    Spouse Name: N/A    Number of Children: N/A  . Years of Education: N/A   Occupational History  . Not on file.   Social History Main Topics  . Smoking status: Never Smoker   . Smokeless tobacco: Never Used  . Alcohol Use: No  . Drug Use: No  . Sexual Activity: Not on file   Other Topics Concern  . Not on file   Social History Narrative  . No narrative on file    Current Outpatient Prescriptions on File Prior to Visit  Medication Sig Dispense Refill  . amLODipine (NORVASC) 5 MG tablet TAKE 1 TABLET DAILY  90 tablet  2  . aspirin 81 MG tablet Take 81 mg by mouth daily.        . carvedilol (COREG) 12.5 MG tablet TAKE 1 TABLET TWICE A DAY (WITH MEALS)  180 tablet  2  . celecoxib (CELEBREX) 200 MG capsule Take 1 capsule (200 mg total) by mouth daily.  30 capsule  3  . losartan-hydrochlorothiazide (HYZAAR) 100-25 MG per tablet TAKE 1 TABLET DAILY  90 tablet  3  . metFORMIN (GLUCOPHAGE-XR) 500 MG 24 hr tablet TAKE 2 TABLETS TWICE A DAY  360 tablet  0  . potassium chloride (K-DUR,KLOR-CON) 10 MEQ tablet TAKE 1 TABLET DAILY  90 tablet  0  . triamcinolone (KENALOG) 0.025 % cream 3x a day as needed for itching  30 g  0   No current facility-administered medications on file prior to visit.    No Known Allergies  Family History  Problem Relation Age of Onset  . Cancer Mother     Breast  . Heart disease Mother     CVA x 2  . Cancer Sister     Colon  . Aneurysm Sister       CNS  . Intracerebral hemorrhage Sister   . Colon cancer Sister 50    BP 114/76  Pulse 78  Temp(Src) 97.8 F (36.6 C) (Oral)  Ht 5\' 1"  (1.549 m)  Wt 220 lb (99.791 kg)  BMI 41.59 kg/m2  SpO2 97%     Review of Systems  Constitutional: Negative for fever and unexpected weight change.  HENT: Negative for hearing loss.   Eyes: Negative for visual disturbance.  Respiratory: Negative for shortness of breath.   Cardiovascular: Negative for chest pain.  Gastrointestinal: Negative for blood in stool.  Endocrine: Negative for cold intolerance.  Genitourinary: Negative for hematuria.  Musculoskeletal: Negative for back pain.  Skin: Negative for rash.  Allergic/Immunologic: Negative for environmental allergies.  Neurological: Negative for syncope.  Hematological: Does not bruise/bleed easily.  Psychiatric/Behavioral: Negative for dysphoric mood.       Objective:   Physical Exam VS: see vs page GEN: no distress HEAD: head: no deformity eyes: no periorbital swelling, no proptosis external nose and ears are normal mouth: no lesion  seen NECK: supple, thyroid is not enlarged CHEST WALL: no deformity LUNGS:  Clear to auscultation BREASTS:  No mass.  No d/c CV: reg rate and rhythm, no murmur ABD: abdomen is soft, nontender.  no hepatosplenomegaly.  not distended.  no hernia GENITALIA:  Normal external female.  Normal bimanual exam RECTAL: normal external and internal exam.  heme neg MUSCULOSKELETAL: muscle bulk and strength are grossly normal.  no obvious joint swelling.  gait is normal and steady EXTEMITIES: no deformity.  no ulcer on the feet.  feet are of normal color and temp.  no edema PULSES: dorsalis pedis intact bilat.  no carotid bruit NEURO:  cn 2-12 grossly intact.   readily moves all 4's.  sensation is intact to touch on the feet SKIN:  Normal texture and temperature.  No rash or suspicious lesion is visible.   NODES:  None palpable at the neck PSYCH: alert,  well-oriented.  Does not appear anxious nor depressed.      Assessment & Plan:  Wellness visit today, with problems stable, except as noted.  Patient is advised the following: Patient Instructions  please consider these measures for your health:  minimize alcohol.  do not use tobacco products.  have a colonoscopy at least every 10 years from age 26.  Women should have an annual mammogram from age 31.  keep firearms safely stored.  always use seat belts.  have working smoke alarms in your home.  see an eye doctor and dentist regularly.  never drive under the influence of alcohol or drugs (including prescription drugs).   blood tests are being requested for you today.  We'll contact you with results. Please come back for a follow-up appointment in 6 months.

## 2014-02-12 ENCOUNTER — Other Ambulatory Visit: Payer: Self-pay

## 2014-03-17 ENCOUNTER — Ambulatory Visit: Payer: 59

## 2014-03-19 ENCOUNTER — Telehealth: Payer: Self-pay

## 2014-03-19 ENCOUNTER — Other Ambulatory Visit: Payer: 59

## 2014-03-19 NOTE — Telephone Encounter (Signed)
Pt is coming in for labs today at 9:30. What orders need to be placed? Thanks!

## 2014-03-19 NOTE — Telephone Encounter (Signed)
Pt removed off of lab schedule.

## 2014-03-19 NOTE — Telephone Encounter (Signed)
i am unaware of any labs that need to be done.

## 2014-05-10 ENCOUNTER — Other Ambulatory Visit: Payer: Self-pay | Admitting: Endocrinology

## 2014-05-31 ENCOUNTER — Other Ambulatory Visit: Payer: Self-pay | Admitting: Endocrinology

## 2014-06-10 ENCOUNTER — Telehealth: Payer: Self-pay | Admitting: Endocrinology

## 2014-06-10 MED ORDER — AMLODIPINE BESYLATE 5 MG PO TABS: 5.0000 mg | ORAL_TABLET | Freq: Every day | ORAL | Status: DC

## 2014-06-10 NOTE — Telephone Encounter (Signed)
Patient need refill of amlodipine

## 2014-06-10 NOTE — Telephone Encounter (Signed)
Rx sent per pt's request.  

## 2014-07-16 ENCOUNTER — Ambulatory Visit (INDEPENDENT_AMBULATORY_CARE_PROVIDER_SITE_OTHER): Payer: Self-pay | Admitting: Endocrinology

## 2014-07-16 VITALS — BP 130/80 | Temp 98.4°F | Wt 227.4 lb

## 2014-07-16 DIAGNOSIS — E119 Type 2 diabetes mellitus without complications: Secondary | ICD-10-CM

## 2014-07-16 DIAGNOSIS — R7989 Other specified abnormal findings of blood chemistry: Secondary | ICD-10-CM

## 2014-07-16 DIAGNOSIS — R946 Abnormal results of thyroid function studies: Secondary | ICD-10-CM

## 2014-07-16 LAB — HEMOGLOBIN A1C: HEMOGLOBIN A1C: 6.6 % — AB (ref 4.6–6.5)

## 2014-07-16 LAB — TSH: TSH: 1.19 u[IU]/mL (ref 0.35–4.50)

## 2014-07-16 NOTE — Progress Notes (Signed)
Pre visit review using our clinic review tool, if applicable. No additional management support is needed unless otherwise documented below in the visit note. 

## 2014-07-16 NOTE — Patient Instructions (Addendum)
blood tests are being requested for you today.  We'll contact you with results. Please come back for a regular physical appointment in 6 months (must be after 01/17/15).  Please consider having weight loss surgery.  It is good for your health.  Here is some information about it.  If you decide to consider further, please call the phone number in the papers, and register for a free informational meeting.

## 2014-07-16 NOTE — Progress Notes (Signed)
Subjective:    Patient ID: Jennifer Saunders, female    DOB: [birth year], 64 y.o.   MRN: [id number]  HPI  The state of at least three ongoing medical problems is addressed today, with interval history of each noted here: Pt returns for f/u of diabetes mellitus: DM type: 2 Dx'ed: 3382 Complications: none Therapy: metformin DKA: never Severe hypoglycemia: never Pancreatitis: never Interval history: no cbg record, but states cbg's are well-controlled. She has slight edema of the legs.  Suppressed TSH: was noted in 2015. She denies tremor Obesity: She has gained weight. Past Medical History  Diagnosis Date  . Anxiety   . Diabetes mellitus   . Hypertension   . Dyslipidemia   . Urolithiasis     Past Surgical History  Procedure Laterality Date  . No prior surgery      History   Social History  . Marital Status: Married    Spouse Name: N/A  . Number of Children: N/A  . Years of Education: N/A   Occupational History  . Not on file.   Social History Main Topics  . Smoking status: Never Smoker   . Smokeless tobacco: Never Used  . Alcohol Use: No  . Drug Use: No  . Sexual Activity: Not on file   Other Topics Concern  . Not on file   Social History Narrative  . No narrative on file    Current Outpatient Prescriptions on File Prior to Visit  Medication Sig Dispense Refill  . amLODipine (NORVASC) 5 MG tablet Take 1 tablet (5 mg total) by mouth daily. 90 tablet 2  . aspirin 81 MG tablet Take 81 mg by mouth daily.      . carvedilol (COREG) 12.5 MG tablet TAKE 1 TABLET TWICE A DAY (WITH MEALS) 180 tablet 2  . celecoxib (CELEBREX) 200 MG capsule Take 1 capsule (200 mg total) by mouth daily. 30 capsule 3  . losartan-hydrochlorothiazide (HYZAAR) 100-25 MG per tablet TAKE 1 TABLET DAILY 90 tablet 2  . metFORMIN (GLUCOPHAGE-XR) 500 MG 24 hr tablet TAKE 2 TABLETS TWICE A DAY 360 tablet 0  . potassium chloride (K-DUR,KLOR-CON) 10 MEQ tablet TAKE 1 TABLET DAILY 90 tablet 0  .  triamcinolone (KENALOG) 0.025 % cream 3x a day as needed for itching 30 g 0   No current facility-administered medications on file prior to visit.    No Known Allergies  Family History  Problem Relation Age of Onset  . Cancer Mother     Breast  . Heart disease Mother     CVA x 2  . Cancer Sister     Colon  . Aneurysm Sister     CNS  . Intracerebral hemorrhage Sister   . Colon cancer Sister 50    BP 130/80 mmHg  Temp(Src) 98.4 F (36.9 C) (Oral)  Wt 227 lb 6.4 oz (103.148 kg)  Review of Systems She denies palpitations and sob    Objective:   Physical Exam VITAL SIGNS:  See vs page. GENERAL: no distress. Pulses: dorsalis pedis intact bilat.   MSK: no deformity of the feet CV: trace bilat leg edema. Skin:  no ulcer on the feet.  normal color and temp on the feet.  Neuro: sensation is intact to touch on the feet.   Lab Results  Component Value Date   HGBA1C 6.6* 07/16/2014   Lab Results  Component Value Date   TSH 1.19 07/16/2014      Assessment & Plan:  DM: well-controlled Morbid obesity:  worse Suppressed TSH, uncertain etiology, resolved.   Patient is advised the following: Patient Instructions  blood tests are being requested for you today.  We'll contact you with results. Please come back for a regular physical appointment in 6 months (must be after 01/17/15).  Please consider having weight loss surgery.  It is good for your health.  Here is some information about it.  If you decide to consider further, please call the phone number in the papers, and register for a free informational meeting.    addendum: Please continue the same medications

## 2014-08-04 ENCOUNTER — Telehealth: Payer: Self-pay | Admitting: Endocrinology

## 2014-08-04 ENCOUNTER — Other Ambulatory Visit: Payer: Self-pay

## 2014-08-04 MED ORDER — CARVEDILOL 12.5 MG PO TABS: ORAL_TABLET | ORAL | Status: DC

## 2014-08-04 NOTE — Telephone Encounter (Signed)
Pt called needs refill for cardedilol send to [facility] on [address] please

## 2014-08-04 NOTE — Telephone Encounter (Signed)
Rx sent to pharmacy   

## 2014-11-30 ENCOUNTER — Other Ambulatory Visit: Payer: Self-pay

## 2014-11-30 DIAGNOSIS — Z1231 Encounter for screening mammogram for malignant neoplasm of breast: Secondary | ICD-10-CM

## 2014-12-19 ENCOUNTER — Other Ambulatory Visit: Payer: Self-pay | Admitting: Endocrinology

## 2015-01-10 ENCOUNTER — Ambulatory Visit
Admission: RE | Admit: 2015-01-10 | Discharge: 2015-01-10 | Disposition: A | Discharge status: Home / Self Care | Payer: 59 | Source: Ambulatory Visit

## 2015-01-10 DIAGNOSIS — Z1231 Encounter for screening mammogram for malignant neoplasm of breast: Secondary | ICD-10-CM

## 2015-01-12 ENCOUNTER — Other Ambulatory Visit (INDEPENDENT_AMBULATORY_CARE_PROVIDER_SITE_OTHER): Payer: 59

## 2015-01-12 ENCOUNTER — Other Ambulatory Visit: Payer: Self-pay

## 2015-01-12 DIAGNOSIS — Z0000 Encounter for general adult medical examination without abnormal findings: Secondary | ICD-10-CM

## 2015-01-12 LAB — CBC WITH DIFFERENTIAL/PLATELET
BASOS PCT: 0.5 % (ref 0.0–3.0)
Basophils Absolute: 0 10*3/uL (ref 0.0–0.1)
EOS PCT: 3.6 % (ref 0.0–5.0)
Eosinophils Absolute: 0.3 10*3/uL (ref 0.0–0.7)
HEMATOCRIT: 41.1 % (ref 36.0–46.0)
HEMOGLOBIN: 13.8 g/dL (ref 12.0–15.0)
Lymphocytes Relative: 34.2 % (ref 12.0–46.0)
Lymphs Abs: 3 10*3/uL (ref 0.7–4.0)
MCHC: 33.5 g/dL (ref 30.0–36.0)
MCV: 89.5 fl (ref 78.0–100.0)
MONO ABS: 0.4 10*3/uL (ref 0.1–1.0)
MONOS PCT: 4.9 % (ref 3.0–12.0)
Neutro Abs: 5 10*3/uL (ref 1.4–7.7)
Neutrophils Relative %: 56.8 % (ref 43.0–77.0)
Platelets: 253 10*3/uL (ref 150.0–400.0)
RBC: 4.6 Mil/uL (ref 3.87–5.11)
RDW: 13.1 % (ref 11.5–15.5)
WBC: 8.9 10*3/uL (ref 4.0–10.5)

## 2015-01-12 LAB — LIPID PANEL
CHOLESTEROL: 104 mg/dL (ref 0–200)
HDL: 36.8 mg/dL — AB (ref >39.00)
LDL CALC: 53 mg/dL (ref 0–99)
NonHDL: 67.38
TRIGLYCERIDES: 74 mg/dL (ref 0.0–149.0)
Total CHOL/HDL Ratio: 3
VLDL: 14.8 mg/dL (ref 0.0–40.0)

## 2015-01-12 LAB — HEPATIC FUNCTION PANEL
ALBUMIN: 3.8 g/dL (ref 3.5–5.2)
ALT: 33 U/L (ref 0–35)
AST: 27 U/L (ref 0–37)
Alkaline Phosphatase: 82 U/L (ref 39–117)
Bilirubin, Direct: 0.2 mg/dL (ref 0.0–0.3)
TOTAL PROTEIN: 6.8 g/dL (ref 6.0–8.3)
Total Bilirubin: 0.7 mg/dL (ref 0.2–1.2)

## 2015-01-12 LAB — BASIC METABOLIC PANEL
BUN: 15 mg/dL (ref 6–23)
CALCIUM: 10.1 mg/dL (ref 8.4–10.5)
CO2: 31 mEq/L (ref 19–32)
CREATININE: 0.77 mg/dL (ref 0.40–1.20)
Chloride: 103 mEq/L (ref 96–112)
GFR: 96.4 mL/min (ref >60.00)
GLUCOSE: 102 mg/dL — AB (ref 70–99)
Potassium: 4 mEq/L (ref 3.5–5.1)
SODIUM: 142 meq/L (ref 135–145)

## 2015-01-12 LAB — URINALYSIS, ROUTINE W REFLEX MICROSCOPIC
BILIRUBIN URINE: NEGATIVE
HGB URINE DIPSTICK: NEGATIVE
KETONES UR: NEGATIVE
LEUKOCYTES UA: NEGATIVE
NITRITE: NEGATIVE
PH: 8 (ref 5.0–8.0)
Specific Gravity, Urine: 1.02 (ref 1.000–1.030)
Total Protein, Urine: NEGATIVE
Urine Glucose: NEGATIVE
Urobilinogen, UA: 0.2 (ref 0.0–1.0)

## 2015-01-12 LAB — MICROALBUMIN / CREATININE URINE RATIO
CREATININE, U: 143.3 mg/dL
Microalb Creat Ratio: 0.5 mg/g (ref 0.0–30.0)
Microalb, Ur: <0.7 mg/dL (ref 0.0–1.9)

## 2015-01-12 LAB — T4, FREE: Free T4: 1.08 ng/dL (ref 0.60–1.60)

## 2015-01-12 LAB — HEMOGLOBIN A1C: HEMOGLOBIN A1C: 6.3 % (ref 4.6–6.5)

## 2015-01-12 LAB — TSH: TSH: 1.87 u[IU]/mL (ref 0.35–4.50)

## 2015-01-17 ENCOUNTER — Encounter: Payer: Self-pay | Admitting: Endocrinology

## 2015-01-17 ENCOUNTER — Ambulatory Visit (INDEPENDENT_AMBULATORY_CARE_PROVIDER_SITE_OTHER): Payer: 59 | Admitting: Endocrinology

## 2015-01-17 VITALS — BP 126/82 | HR 85 | Temp 98.6°F | Ht 61.0 in | Wt 223.0 lb

## 2015-01-17 DIAGNOSIS — Z0000 Encounter for general adult medical examination without abnormal findings: Secondary | ICD-10-CM | POA: Diagnosis not present

## 2015-01-17 DIAGNOSIS — N951 Menopausal and female climacteric states: Secondary | ICD-10-CM

## 2015-01-17 MED ORDER — METFORMIN HCL ER 500 MG PO TB24: ORAL_TABLET | ORAL | Status: DC

## 2015-01-17 MED ORDER — TRIAMCINOLONE ACETONIDE 0.025 % EX CREA: TOPICAL_CREAM | CUTANEOUS | Status: DC

## 2015-01-17 MED ORDER — POTASSIUM CHLORIDE CRYS ER 10 MEQ PO TBCR: EXTENDED_RELEASE_TABLET | ORAL | Status: DC

## 2015-01-17 MED ORDER — CARVEDILOL 12.5 MG PO TABS: ORAL_TABLET | ORAL | Status: DC

## 2015-01-17 NOTE — Progress Notes (Signed)
Subjective:    Patient ID: Jennifer Saunders, female    DOB: 1951-03-30, 64 y.o.   MRN: [id number]  HPI Pt is here for regular wellness examination, and is feeling pretty well in general, and says chronic med probs are stable, except as noted below Past Medical History  Diagnosis Date  . Anxiety   . Diabetes mellitus   . Hypertension   . Dyslipidemia   . Urolithiasis     Past Surgical History  Procedure Laterality Date  . No prior surgery      Social History   Social History  . Marital Status: Married    Spouse Name: N/A  . Number of Children: N/A  . Years of Education: N/A   Occupational History  . Not on file.   Social History Main Topics  . Smoking status: Never Smoker   . Smokeless tobacco: Never Used  . Alcohol Use: No  . Drug Use: No  . Sexual Activity: Not on file   Other Topics Concern  . Not on file   Social History Narrative    Current Outpatient Prescriptions on File Prior to Visit  Medication Sig Dispense Refill  . amLODipine (NORVASC) 5 MG tablet Take 1 tablet (5 mg total) by mouth daily. 90 tablet 2  . aspirin 81 MG tablet Take 81 mg by mouth daily.      . celecoxib (CELEBREX) 200 MG capsule Take 1 capsule (200 mg total) by mouth daily. 30 capsule 3  . losartan-hydrochlorothiazide (HYZAAR) 100-25 MG per tablet TAKE 1 TABLET DAILY 90 tablet 2   No current facility-administered medications on file prior to visit.    No Known Allergies  Family History  Problem Relation Age of Onset  . Cancer Mother     Breast  . Heart disease Mother     CVA x 2  . Cancer Sister     Colon  . Aneurysm Sister     CNS  . Intracerebral hemorrhage Sister   . Colon cancer Sister 50    BP 126/82 mmHg  Pulse 85  Temp(Src) 98.6 F (37 C) (Oral)  Ht 5' 1"  (1.549 m)  Wt 223 lb (101.152 kg)  BMI 42.16 kg/m2  SpO2 97%  Review of Systems  Constitutional: Negative for fever.  HENT: Negative for hearing loss.   Eyes: Negative for visual disturbance.    Respiratory: Negative for shortness of breath.   Cardiovascular: Negative for chest pain.  Gastrointestinal: Negative for blood in stool.  Endocrine: Negative for cold intolerance.  Genitourinary: Negative for hematuria.  Musculoskeletal: Negative for gait problem.  Skin: Negative for wound.  Allergic/Immunologic: Negative for environmental allergies.  Neurological: Negative for syncope and numbness.  Hematological: Does not bruise/bleed easily.  Psychiatric/Behavioral: Negative for dysphoric mood.       Objective:   Physical Exam VS: see vs page GEN: no distress HEAD: head: no deformity eyes: no periorbital swelling, no proptosis external nose and ears are normal mouth: no lesion seen NECK: supple, thyroid is not enlarged CHEST WALL: no deformity LUNGS:  Clear to auscultation CV: reg rate and rhythm, no murmur ABD: abdomen is soft, nontender.  no hepatosplenomegaly.  not distended.  no hernia MUSCULOSKELETAL: muscle bulk and strength are grossly normal.  no obvious joint swelling.  gait is normal and steady EXTEMITIES: no deformity.  no ulcer on the feet.  feet are of normal color and temp.  no edema PULSES: dorsalis pedis intact bilat.  no carotid bruit NEURO:  cn 2-12  grossly intact.   readily moves all 4's.  sensation is intact to touch on the feet SKIN:  Normal texture and temperature.  No rash or suspicious lesion is visible.   NODES:  None palpable at the neck PSYCH: alert, well-oriented.  Does not appear anxious nor depressed.     Assessment & Plan:  Wellness visit today, with problems stable, except as noted.   Patient is advised the following: Patient Instructions  please consider these measures for your health:  minimize alcohol.  do not use tobacco products.  have a colonoscopy at least every 10 years from age 24.  Women should have an annual mammogram from age 50.  keep firearms safely stored.  always use seat belts.  have working smoke alarms in your home.   see an eye doctor and dentist regularly.  never drive under the influence of alcohol or drugs (including prescription drugs).   it is critically important to prevent falling down (keep floor areas well-lit, dry, and free of loose objects.  If you have a cane, walker, or wheelchair, you should use it, even for short trips around the house.  Also, try not to rush).  Please come back for a follow-up appointment in 6 months.

## 2015-01-17 NOTE — Patient Instructions (Signed)
please consider these measures for your health:  minimize alcohol.  do not use tobacco products.  have a colonoscopy at least every 10 years from age 66.  Women should have an annual mammogram from age 45.  keep firearms safely stored.  always use seat belts.  have working smoke alarms in your home.  see an eye doctor and dentist regularly.  never drive under the influence of alcohol or drugs (including prescription drugs).   it is critically important to prevent falling down (keep floor areas well-lit, dry, and free of loose objects.  If you have a cane, walker, or wheelchair, you should use it, even for short trips around the house.  Also, try not to rush).  Please come back for a follow-up appointment in 6 months.

## 2015-01-17 NOTE — Progress Notes (Signed)
we discussed code status.  pt requests full code, but would not want to be started or maintained on artificial life-support measures if there was not a reasonable chance of recovery 

## 2015-01-26 ENCOUNTER — Ambulatory Visit (INDEPENDENT_AMBULATORY_CARE_PROVIDER_SITE_OTHER)
Admission: RE | Admit: 2015-01-26 | Discharge: 2015-01-26 | Disposition: A | Discharge status: Home / Self Care | Payer: 59 | Source: Ambulatory Visit | Attending: Endocrinology | Admitting: Endocrinology

## 2015-01-26 DIAGNOSIS — Z13820 Encounter for screening for osteoporosis: Secondary | ICD-10-CM

## 2015-01-26 DIAGNOSIS — Z0000 Encounter for general adult medical examination without abnormal findings: Secondary | ICD-10-CM

## 2015-01-26 DIAGNOSIS — N951 Menopausal and female climacteric states: Secondary | ICD-10-CM

## 2015-03-29 ENCOUNTER — Other Ambulatory Visit: Payer: Self-pay | Admitting: Endocrinology

## 2015-04-06 ENCOUNTER — Other Ambulatory Visit: Payer: Self-pay | Admitting: Endocrinology

## 2015-07-18 ENCOUNTER — Ambulatory Visit: Payer: 59 | Admitting: Endocrinology

## 2015-08-15 ENCOUNTER — Encounter: Payer: Self-pay | Admitting: Endocrinology

## 2015-08-15 ENCOUNTER — Ambulatory Visit (INDEPENDENT_AMBULATORY_CARE_PROVIDER_SITE_OTHER): Payer: 59 | Admitting: Endocrinology

## 2015-08-15 VITALS — BP 137/88 | HR 75 | Temp 98.0°F | Ht 61.0 in | Wt 226.0 lb

## 2015-08-15 DIAGNOSIS — E119 Type 2 diabetes mellitus without complications: Secondary | ICD-10-CM

## 2015-08-15 DIAGNOSIS — K219 Gastro-esophageal reflux disease without esophagitis: Secondary | ICD-10-CM | POA: Diagnosis not present

## 2015-08-15 DIAGNOSIS — R079 Chest pain, unspecified: Secondary | ICD-10-CM | POA: Insufficient documentation

## 2015-08-15 LAB — POCT GLYCOSYLATED HEMOGLOBIN (HGB A1C): Hemoglobin A1C: 6.2

## 2015-08-15 MED ORDER — OMEPRAZOLE 40 MG PO CPDR: 40.0000 mg | DELAYED_RELEASE_CAPSULE | Freq: Every day | ORAL | Status: DC

## 2015-08-15 NOTE — Progress Notes (Signed)
Subjective:    Patient ID: Jennifer Saunders, female    DOB: 1950-05-05, 65 y.o.   MRN: [id number]  HPI Pt states 2 weeds of slight "sensation" at the upper chest/anterior neck.  She has assoc sob.  She says this is worse at night, with lying supine.  She has started taking prilosec, but this does not help.  She feels she cannot walk on a treadmill.   Past Medical History  Diagnosis Date  . Anxiety   . Diabetes mellitus   . Hypertension   . Dyslipidemia   . Urolithiasis     Past Surgical History  Procedure Laterality Date  . No prior surgery      Social History   Social History  . Marital Status: Married    Spouse Name: N/A  . Number of Children: N/A  . Years of Education: N/A   Occupational History  . Not on file.   Social History Main Topics  . Smoking status: Never Smoker   . Smokeless tobacco: Never Used  . Alcohol Use: No  . Drug Use: No  . Sexual Activity: Not on file   Other Topics Concern  . Not on file   Social History Narrative    Current Outpatient Prescriptions on File Prior to Visit  Medication Sig Dispense Refill  . amLODipine (NORVASC) 5 MG tablet TAKE 1 TABLET DAILY 90 tablet 1  . aspirin 81 MG tablet Take 81 mg by mouth daily.      . carvedilol (COREG) 12.5 MG tablet TAKE 1 TABLET TWICE A DAY (WITH MEALS) 180 tablet 3  . losartan-hydrochlorothiazide (HYZAAR) 100-25 MG tablet TAKE 1 TABLET DAILY 90 tablet 1  . metFORMIN (GLUCOPHAGE-XR) 500 MG 24 hr tablet TAKE 2 TABLETS TWICE A DAY 360 tablet 3  . potassium chloride (K-DUR,KLOR-CON) 10 MEQ tablet TAKE 1 TABLET DAILY 90 tablet 3  . triamcinolone (KENALOG) 0.025 % cream 3x a day as needed for itching 30 g 2   No current facility-administered medications on file prior to visit.    No Known Allergies  Family History  Problem Relation Age of Onset  . Cancer Mother     Breast  . Heart disease Mother     CVA x 2  . Cancer Sister     Colon  . Aneurysm Sister     CNS  . Intracerebral  hemorrhage Sister   . Colon cancer Sister 50    BP 137/88 mmHg  Pulse 75  Temp(Src) 98 F (36.7 C) (Oral)  Ht 5\' 1"  (1.549 m)  Wt 226 lb (102.513 kg)  BMI 42.72 kg/m2  SpO2 95%  Review of Systems Denies LOC and heartburn    Objective:   Physical Exam VITAL SIGNS:  See vs page GENERAL: no distress NECK: There is no palpable thyroid enlargement.  No thyroid nodule is palpable.  No palpable lymphadenopathy at the anterior neck. LUNGS:  Clear to auscultation HEART:  Regular rate and rhythm without murmurs noted. Normal S1,S2.     i personally reviewed electrocardiogram tracing (today): Indication: chest pain Impression: Low voltage in precordial leads. RSR(V1)    Assessment & Plan:  Chest pain, new, unlikely to be cardiogenic.    Patient is advised the following: Patient Instructions  Let's check a special type of heart x-ray.  you will receive a phone call, about a day and time for an appointment.   Please see a stomach specialist.  you will receive a phone call, about a day and time  for an appointment.   i have sent a prescription to your pharmacy, for the prilosec.

## 2015-08-15 NOTE — Patient Instructions (Addendum)
Let's check a special type of heart x-ray.  you will receive a phone call, about a day and time for an appointment.   Please see a stomach specialist.  you will receive a phone call, about a day and time for an appointment.   i have sent a prescription to your pharmacy, for the prilosec.

## 2015-08-25 ENCOUNTER — Telehealth: Payer: Self-pay | Admitting: Endocrinology

## 2015-08-25 NOTE — Telephone Encounter (Signed)
08-25-15 Lvm on house/cell to schedule  myoview/saf

## 2015-09-07 ENCOUNTER — Telehealth (HOSPITAL_COMMUNITY): Payer: Self-pay | Admitting: *Deleted

## 2015-09-07 NOTE — Telephone Encounter (Signed)
Left message on voicemail in reference to upcoming appointment scheduled for 09/12/15. Phone number given for a call back so details instructions can be given. Jennifer Saunders, [name]

## 2015-09-08 ENCOUNTER — Telehealth (HOSPITAL_COMMUNITY): Payer: Self-pay | Admitting: *Deleted

## 2015-09-08 NOTE — Telephone Encounter (Signed)
Left message on voicemail in reference to upcoming appointment scheduled for 09/12/15. Phone number given for a call back so details instructions can be given. Jennifer Saunders, Jennifer Saunders

## 2015-09-12 ENCOUNTER — Ambulatory Visit (HOSPITAL_COMMUNITY): Payer: 59 | Attending: Cardiovascular Disease

## 2015-09-12 DIAGNOSIS — I10 Essential (primary) hypertension: Secondary | ICD-10-CM | POA: Insufficient documentation

## 2015-09-12 DIAGNOSIS — R079 Chest pain, unspecified: Secondary | ICD-10-CM | POA: Insufficient documentation

## 2015-09-12 DIAGNOSIS — R0609 Other forms of dyspnea: Secondary | ICD-10-CM | POA: Insufficient documentation

## 2015-09-12 MED ORDER — TECHNETIUM TC 99M TETROFOSMIN IV KIT
32.5000 | PACK | Freq: Once | INTRAVENOUS | Status: AC | PRN
  Administered 2015-09-12: 33 via INTRAVENOUS
  Filled 2015-09-12: qty 33

## 2015-09-12 MED ORDER — REGADENOSON 0.4 MG/5ML IV SOLN
0.4000 mg | Freq: Once | INTRAVENOUS | Status: AC
  Administered 2015-09-12: 0.4 mg via INTRAVENOUS

## 2015-09-13 ENCOUNTER — Ambulatory Visit (HOSPITAL_COMMUNITY): Payer: 59 | Attending: Cardiology

## 2015-09-13 LAB — MYOCARDIAL PERFUSION IMAGING
CHL CUP NUCLEAR SDS: 5
CHL CUP NUCLEAR SRS: 1
CHL CUP RESTING HR STRESS: 98 {beats}/min
LV dias vol: 62 mL (ref 46–106)
LV sys vol: 13 mL
NUC STRESS TID: 0.77
Peak HR: 115 {beats}/min
RATE: 0.35
SSS: 6

## 2015-09-13 MED ORDER — TECHNETIUM TC 99M TETROFOSMIN IV KIT
33.0000 | PACK | Freq: Once | INTRAVENOUS | Status: AC | PRN
  Administered 2015-09-13: 33 via INTRAVENOUS
  Filled 2015-09-13: qty 33

## 2015-10-17 ENCOUNTER — Encounter: Payer: Self-pay | Admitting: Gastroenterology

## 2015-10-17 ENCOUNTER — Ambulatory Visit (INDEPENDENT_AMBULATORY_CARE_PROVIDER_SITE_OTHER): Payer: 59 | Admitting: Gastroenterology

## 2015-10-17 VITALS — BP 130/84 | HR 101 | Ht 61.0 in | Wt 227.0 lb

## 2015-10-17 DIAGNOSIS — Z6841 Body Mass Index (BMI) 40.0 and over, adult: Secondary | ICD-10-CM | POA: Insufficient documentation

## 2015-10-17 DIAGNOSIS — K219 Gastro-esophageal reflux disease without esophagitis: Secondary | ICD-10-CM | POA: Diagnosis not present

## 2015-10-17 DIAGNOSIS — R0989 Other specified symptoms and signs involving the circulatory and respiratory systems: Secondary | ICD-10-CM

## 2015-10-17 DIAGNOSIS — F458 Other somatoform disorders: Secondary | ICD-10-CM

## 2015-10-17 DIAGNOSIS — Z86010 Personal history of colonic polyps: Secondary | ICD-10-CM | POA: Diagnosis not present

## 2015-10-17 DIAGNOSIS — E66812 Obesity, class 2: Secondary | ICD-10-CM | POA: Insufficient documentation

## 2015-10-17 MED ORDER — OMEPRAZOLE 40 MG PO CPDR: 40.0000 mg | DELAYED_RELEASE_CAPSULE | Freq: Every day | ORAL | Status: DC

## 2015-10-17 NOTE — Patient Instructions (Signed)
We have sent the following medications to your pharmacy for you to pick up at your convenience: omeprazole.   Patient advised to avoid spicy, acidic, citrus, chocolate, mints, fruit and fruit juices.  Limit the intake of caffeine, alcohol and Soda.  Don't exercise too soon after eating.  Don't lie down within 3-4 hours of eating.  Elevate the head of your bed.  If your symptoms are no better in 2 months, then call our office to schedule a follow up visit.   Thank you for choosing me and [facility].  Pricilla Riffle. Dagoberto Ligas., MD., Marval Regal

## 2015-10-17 NOTE — Progress Notes (Signed)
    History of Present Illness: This is a 65 year old female referred by Renato Shin, MD for the evaluation of globus sensation and reflux symptoms. She notes the onset of substernal burning and sensation of something stuck in her throat particularly at night time for the past few months. She began taking Prilosec 20 mg OTC daily for 2 weeks with no improvement in symptoms this was increased to omeprazole 40 mg daily by her PCP and her symptoms have substantially improved. Denies weight loss, abdominal pain, constipation, diarrhea, change in stool caliber, melena, hematochezia, nausea, vomiting, dysphagia, chest pain.   Review of Systems: Pertinent positive and negative review of systems were noted in the above HPI section. All other review of systems were otherwise negative.  Current Medications, Allergies, Past Medical History, Past Surgical History, Family History and Social History were reviewed in [employer] record.  Physical Exam: General: Well developed, well nourished, obese, no acute distress Head: Normocephalic and atraumatic Eyes:  sclerae anicteric, EOMI Ears: Normal auditory acuity Mouth: No deformity or lesions Neck: Supple, no masses or thyromegaly Lungs: Clear throughout to auscultation Heart: Regular rate and rhythm; no murmurs, rubs or bruits Abdomen: Soft, non tender and non distended. No masses, hepatosplenomegaly or hernias noted. Normal Bowel sounds Musculoskeletal: Symmetrical with no gross deformities  Skin: No lesions on visible extremities Pulses:  Normal pulses noted Extremities: No clubbing, cyanosis, edema or deformities noted Neurological: Alert oriented x 4, grossly nonfocal Cervical Nodes:  No significant cervical adenopathy Inguinal Nodes: No significant inguinal adenopathy Psychological:  Alert and cooperative. Normal mood and affect  Assessment and Recommendations:  1. GERD and globus sensation. Symptoms have responded very  well to omeprazole 40 mg daily. Follow standard antireflux measures and continue omeprazole 40 mg daily. If her symptoms completely resolve over the next 2 months no further evaluation is planned however if her symptoms persist consider further evaluation with EGD. Patient is advised to contact us if her symptoms have not resolved in 2 months.  2. Personal history of adenomatous colon polyps and family history of colon cancer. Five-year interval colonoscopy is recommended in May 2019.  3. Obesity, BMI = 43. A long-term weight loss program supervised by her PCP is recommended.  cc: Renato Shin, MD [address] [place], [place] [address]

## 2015-11-27 ENCOUNTER — Other Ambulatory Visit: Payer: Self-pay | Admitting: Endocrinology

## 2016-01-04 ENCOUNTER — Other Ambulatory Visit: Payer: Self-pay | Admitting: Endocrinology

## 2016-01-17 ENCOUNTER — Other Ambulatory Visit: Payer: Self-pay | Admitting: Endocrinology

## 2016-01-17 DIAGNOSIS — Z1231 Encounter for screening mammogram for malignant neoplasm of breast: Secondary | ICD-10-CM

## 2016-01-20 ENCOUNTER — Ambulatory Visit
Admission: RE | Admit: 2016-01-20 | Discharge: 2016-01-20 | Disposition: A | Discharge status: Home / Self Care | Payer: 59 | Source: Ambulatory Visit | Attending: Endocrinology | Admitting: Endocrinology

## 2016-01-20 DIAGNOSIS — Z1231 Encounter for screening mammogram for malignant neoplasm of breast: Secondary | ICD-10-CM

## 2016-01-25 ENCOUNTER — Other Ambulatory Visit: Payer: Self-pay | Admitting: Endocrinology

## 2016-03-29 ENCOUNTER — Other Ambulatory Visit: Payer: Self-pay | Admitting: Endocrinology

## 2016-04-03 ENCOUNTER — Other Ambulatory Visit: Payer: Self-pay | Admitting: Endocrinology

## 2016-04-08 NOTE — Progress Notes (Signed)
Subjective:    Patient ID: Jennifer Saunders, female    DOB: 1950/08/28, 65 y.o.   MRN: [id number]  HPI Pt is here for regular wellness examination, and is feeling pretty well in general, and says chronic med probs are stable, except as noted below Past Medical History:  Diagnosis Date  . Adenomatous colon polyp 2003  . Anxiety   . Diabetes mellitus   . Dyslipidemia   . GERD (gastroesophageal reflux disease)   . Hypertension   . Urolithiasis     Past Surgical History:  Procedure Laterality Date  . no prior surgery      Social History   Social History  . Marital status: Married    Spouse name: N/A  . Number of children: N/A  . Years of education: N/A   Occupational History  . Not on file.   Social History Main Topics  . Smoking status: Never Smoker  . Smokeless tobacco: Never Used  . Alcohol use No  . Drug use: No  . Sexual activity: Not on file   Other Topics Concern  . Not on file   Social History Narrative  . No narrative on file    Current Outpatient Prescriptions on File Prior to Visit  Medication Sig Dispense Refill  . amLODipine (NORVASC) 5 MG tablet TAKE 1 TABLET DAILY 90 tablet 1  . aspirin 81 MG tablet Take 81 mg by mouth daily.      . carvedilol (COREG) 12.5 MG tablet TAKE 1 TABLET TWICE A DAY WITH MEALS 180 tablet 3  . losartan-hydrochlorothiazide (HYZAAR) 100-25 MG tablet TAKE 1 TABLET DAILY 90 tablet 0  . metFORMIN (GLUCOPHAGE-XR) 500 MG 24 hr tablet TAKE 2 TABLETS TWICE A DAY 360 tablet 3  . omeprazole (PRILOSEC) 40 MG capsule Take 1 capsule (40 mg total) by mouth daily. 30 capsule 11  . triamcinolone (KENALOG) 0.025 % cream 3x a day as needed for itching 30 g 2   No current facility-administered medications on file prior to visit.     No Known Allergies  Family History  Problem Relation Age of Onset  . Cancer Mother     Breast  . Heart disease Mother     CVA x 2  . Cancer Sister     Colon  . Aneurysm Sister     CNS  .  Intracerebral hemorrhage Sister   . Colon cancer Sister 50    BP 126/84   Pulse 76   Ht 5\' 1"  (1.549 m)   Wt 223 lb (101.2 kg)   SpO2 91%   BMI 42.14 kg/m    Review of Systems Constitutional: Negative for fever.  HENT: Negative for otalgia.   Eyes: Negative for photophobia.  Respiratory: Negative for shortness of breath.   Cardiovascular: Negative for chest pain.  Endocrine: Negative for cold intolerance.  Genitourinary: Negative for hematuria.  Musculoskeletal: Negative for gait problem.  Skin: Negative for wound.  Allergic/Immunologic: Negative for environmental allergies.  Neurological: Negative for syncope and numbness.  Hematological: does bruise/bleed easily.  Psychiatric/Behavioral: Negative for insomnia.     Objective:   Physical Exam VS: see vs page GEN: no distress HEAD: head: no deformity eyes: no periorbital swelling, no proptosis external nose and ears are normal mouth: no lesion seen NECK: supple, thyroid is not enlarged CHEST WALL: no deformity LUNGS:  Clear to auscultation CV: reg rate and rhythm, no murmur MUSCULOSKELETAL: muscle bulk and strength are grossly normal.  no obvious joint swelling.  gait is  normal and steady EXTEMITIES: no deformity.  no ulcer on the feet.  feet are of normal color and temp.  no edema PULSES: dorsalis pedis intact bilat.  no carotid bruit NEURO:  cn 2-12 grossly intact.   readily moves all 4's.  sensation is intact to touch on the feet SKIN:  Normal texture and temperature.  No rash or suspicious lesion is visible.   NODES:  None palpable at the neck PSYCH: alert, well-oriented.  Does not appear anxious nor depressed.        Assessment & Plan:  Wellness visit today, with problems stable, except as noted.   SEPARATE EVALUATION FOLLOWS--EACH PROBLEM HERE IS NEW, NOT RESPONDING TO TREATMENT, OR POSES SIGNIFICANT RISK TO THE PATIENT'S HEALTH: HISTORY OF THE PRESENT ILLNESS: Pt was noted today to have elev hepatic  transaminases--new finding.  No h/o liver dz PAST MEDICAL HISTORY Past Medical History:  Diagnosis Date  . Adenomatous colon polyp 2003  . Anxiety   . Diabetes mellitus   . Dyslipidemia   . GERD (gastroesophageal reflux disease)   . Hypertension   . Urolithiasis     Past Surgical History:  Procedure Laterality Date  . no prior surgery      Social History   Social History  . Marital status: Married    Spouse name: N/A  . Number of children: N/A  . Years of education: N/A   Occupational History  . Not on file.   Social History Main Topics  . Smoking status: Never Smoker  . Smokeless tobacco: Never Used  . Alcohol use No  . Drug use: No  . Sexual activity: Not on file   Other Topics Concern  . Not on file   Social History Narrative  . No narrative on file    Current Outpatient Prescriptions on File Prior to Visit  Medication Sig Dispense Refill  . amLODipine (NORVASC) 5 MG tablet TAKE 1 TABLET DAILY 90 tablet 1  . aspirin 81 MG tablet Take 81 mg by mouth daily.      . carvedilol (COREG) 12.5 MG tablet TAKE 1 TABLET TWICE A DAY WITH MEALS 180 tablet 3  . losartan-hydrochlorothiazide (HYZAAR) 100-25 MG tablet TAKE 1 TABLET DAILY 90 tablet 0  . metFORMIN (GLUCOPHAGE-XR) 500 MG 24 hr tablet TAKE 2 TABLETS TWICE A DAY 360 tablet 3  . omeprazole (PRILOSEC) 40 MG capsule Take 1 capsule (40 mg total) by mouth daily. 30 capsule 11  . triamcinolone (KENALOG) 0.025 % cream 3x a day as needed for itching 30 g 2   No current facility-administered medications on file prior to visit.     No Known Allergies  Family History  Problem Relation Age of Onset  . Cancer Mother     Breast  . Heart disease Mother     CVA x 2  . Cancer Sister     Colon  . Aneurysm Sister     CNS  . Intracerebral hemorrhage Sister   . Colon cancer Sister 50    BP 126/84   Pulse 76   Ht 5\' 1"  (1.549 m)   Wt 223 lb (101.2 kg)   SpO2 91%   BMI 42.14 kg/m   REVIEW OF SYSTEMS: Denies  BRBPR PHYSICAL EXAMINATION: VITAL SIGNS:  See vs page GENERAL: no distress ABDOMEN: abdomen is soft, nontender.  no hepatosplenomegaly.  not distended.  no hernia LAB/XRAY RESULTS: Lab Results  Component Value Date   ALT 42 (H) 04/10/2016   AST 45 (H) 04/10/2016  ALKPHOS 95 04/10/2016   BILITOT 0.6 04/10/2016  IMPRESSION: elev transaminases, new PLAN:  Recheck 2 weeks. Weight loss is advised Please continue the same medications, as these are low-risk for the liver.

## 2016-04-10 ENCOUNTER — Ambulatory Visit (INDEPENDENT_AMBULATORY_CARE_PROVIDER_SITE_OTHER): Payer: 59 | Admitting: Endocrinology

## 2016-04-10 ENCOUNTER — Encounter: Payer: Self-pay | Admitting: Endocrinology

## 2016-04-10 VITALS — BP 126/84 | HR 76 | Ht 61.0 in | Wt 223.0 lb

## 2016-04-10 DIAGNOSIS — K7581 Nonalcoholic steatohepatitis (NASH): Secondary | ICD-10-CM | POA: Diagnosis not present

## 2016-04-10 DIAGNOSIS — Z0000 Encounter for general adult medical examination without abnormal findings: Secondary | ICD-10-CM | POA: Insufficient documentation

## 2016-04-10 DIAGNOSIS — E119 Type 2 diabetes mellitus without complications: Secondary | ICD-10-CM | POA: Diagnosis not present

## 2016-04-10 LAB — POCT GLYCOSYLATED HEMOGLOBIN (HGB A1C): Hemoglobin A1C: 6.3

## 2016-04-10 LAB — MICROALBUMIN / CREATININE URINE RATIO
Creatinine,U: 120.9 mg/dL
Microalb Creat Ratio: 0.6 mg/g (ref 0.0–30.0)

## 2016-04-10 NOTE — Patient Instructions (Addendum)
Please consider these measures for your health:  minimize alcohol.  Do not use tobacco products.  Have a colonoscopy at least every 10 years from age 65.  Women should have an annual mammogram from age 20.  Keep firearms safely stored.  Always use seat belts.  have working smoke alarms in your home.  See an eye doctor and dentist regularly.  Never drive under the influence of alcohol or drugs (including prescription drugs).   It is critically important to prevent falling down (keep floor areas well-lit, dry, and free of loose objects.  If you have a cane, walker, or wheelchair, you should use it, even for short trips around the house.  Wear flat-soled shoes.  Also, try not to rush).   good diet and exercise significantly improve the control of your diabetes.  please let me know if you wish to be referred to a dietician.  high blood sugar is very risky to your health.  you should see an eye doctor and dentist every year.  It is very important to get all recommended vaccinations.  Controlling your blood pressure and cholesterol drastically reduces the damage diabetes does to your body.  Those who smoke should quit.  Please discuss these with your doctor.  blood tests are requested for you today.  We'll let you know about the results.  Please come back for a follow-up appointment in 6 months.

## 2016-04-10 NOTE — Progress Notes (Signed)
we discussed code status.  pt requests full code, but would not want to be started or maintained on artificial life-support measures if there was not a reasonable chance of recovery 

## 2016-04-11 DIAGNOSIS — K7581 Nonalcoholic steatohepatitis (NASH): Secondary | ICD-10-CM | POA: Insufficient documentation

## 2016-04-11 LAB — CBC WITH DIFFERENTIAL/PLATELET
BASOS ABS: 0.1 10*3/uL (ref 0.0–0.1)
BASOS PCT: 0.8 % (ref 0.0–3.0)
EOS PCT: 2.8 % (ref 0.0–5.0)
Eosinophils Absolute: 0.3 10*3/uL (ref 0.0–0.7)
HEMATOCRIT: 41.5 % (ref 36.0–46.0)
Hemoglobin: 13.8 g/dL (ref 12.0–15.0)
LYMPHS ABS: 3.1 10*3/uL (ref 0.7–4.0)
LYMPHS PCT: 31.9 % (ref 12.0–46.0)
MCHC: 33.2 g/dL (ref 30.0–36.0)
MCV: 89.1 fl (ref 78.0–100.0)
MONOS PCT: 5.8 % (ref 3.0–12.0)
Monocytes Absolute: 0.6 10*3/uL (ref 0.1–1.0)
NEUTROS ABS: 5.6 10*3/uL (ref 1.4–7.7)
Neutrophils Relative %: 58.7 % (ref 43.0–77.0)
PLATELETS: 281 10*3/uL (ref 150.0–400.0)
RBC: 4.65 Mil/uL (ref 3.87–5.11)
RDW: 13.5 % (ref 11.5–15.5)
WBC: 9.6 10*3/uL (ref 4.0–10.5)

## 2016-04-11 LAB — BASIC METABOLIC PANEL
BUN: 11 mg/dL (ref 6–23)
CHLORIDE: 101 meq/L (ref 96–112)
CO2: 28 mEq/L (ref 19–32)
Calcium: 10.1 mg/dL (ref 8.4–10.5)
Creatinine, Ser: 0.84 mg/dL (ref 0.40–1.20)
GFR: 86.86 mL/min (ref >60.00)
GLUCOSE: 87 mg/dL (ref 70–99)
POTASSIUM: 4.2 meq/L (ref 3.5–5.1)
Sodium: 139 mEq/L (ref 135–145)

## 2016-04-11 LAB — URINALYSIS, ROUTINE W REFLEX MICROSCOPIC
Bilirubin Urine: NEGATIVE
Hgb urine dipstick: NEGATIVE
Ketones, ur: NEGATIVE
LEUKOCYTES UA: NEGATIVE
Nitrite: NEGATIVE
PH: 5.5 (ref 5.0–8.0)
SPECIFIC GRAVITY, URINE: 1.025 (ref 1.000–1.030)
Total Protein, Urine: NEGATIVE
URINE GLUCOSE: NEGATIVE
Urobilinogen, UA: 0.2 (ref 0.0–1.0)

## 2016-04-11 LAB — LIPID PANEL
CHOLESTEROL: 121 mg/dL (ref 0–200)
HDL: 36.9 mg/dL — ABNORMAL LOW (ref >39.00)
LDL CALC: 67 mg/dL (ref 0–99)
NonHDL: 84.16
Total CHOL/HDL Ratio: 3
Triglycerides: 85 mg/dL (ref 0.0–149.0)
VLDL: 17 mg/dL (ref 0.0–40.0)

## 2016-04-11 LAB — HEPATIC FUNCTION PANEL
ALK PHOS: 95 U/L (ref 39–117)
ALT: 42 U/L — ABNORMAL HIGH (ref 0–35)
AST: 45 U/L — ABNORMAL HIGH (ref 0–37)
Albumin: 4 g/dL (ref 3.5–5.2)
BILIRUBIN DIRECT: 0.1 mg/dL (ref 0.0–0.3)
BILIRUBIN TOTAL: 0.6 mg/dL (ref 0.2–1.2)
Total Protein: 7.2 g/dL (ref 6.0–8.3)

## 2016-04-11 LAB — TSH: TSH: 1.62 u[IU]/mL (ref 0.35–4.50)

## 2016-04-11 LAB — HEPATITIS C ANTIBODY: HCV Ab: NEGATIVE

## 2016-05-29 LAB — HM DIABETES EYE EXAM

## 2016-05-30 ENCOUNTER — Ambulatory Visit (INDEPENDENT_AMBULATORY_CARE_PROVIDER_SITE_OTHER): Payer: 59 | Admitting: Physician Assistant

## 2016-05-30 ENCOUNTER — Encounter: Payer: Self-pay | Admitting: Physician Assistant

## 2016-05-30 VITALS — BP 128/82 | HR 66 | Temp 98.5°F | Resp 16 | Ht 61.5 in | Wt 225.0 lb

## 2016-05-30 DIAGNOSIS — I10 Essential (primary) hypertension: Secondary | ICD-10-CM

## 2016-05-30 DIAGNOSIS — Z23 Encounter for immunization: Secondary | ICD-10-CM | POA: Diagnosis not present

## 2016-05-30 DIAGNOSIS — M542 Cervicalgia: Secondary | ICD-10-CM

## 2016-05-30 NOTE — Progress Notes (Signed)
Patient presents to clinic today to establish care.  Acute Concerns: R shoulder pain -- intermittent. Only at night time. R trapezius pain. Sleeps on her R side. Aching pain. Does not radiate. Has taken occasional advil. Endorses helping with pain.   Chronic Issues: Hypertension -- Is currently on Losartan-HCTZ, Carvedilol and Amlodipine. Is taking medications as directed. Patient denies chest pain, palpitations, lightheadedness, dizziness, vision changes or frequent headaches. Denies history of stroke or heart attack.   BP Readings from Last 3 Encounters:  05/30/16 128/82  04/10/16 126/84  10/17/15 130/84   Diabetes Mellitus II -- Controlled. Is followed by Endocrinology Loanne Drilling).   Lab Results  Component Value Date   HGBA1C 6.3 04/10/2016    Past Medical History:  Diagnosis Date  . Adenomatous colon polyp 2003  . Anxiety   . Diabetes mellitus   . Dyslipidemia   . GERD (gastroesophageal reflux disease)   . Hypertension   . Urolithiasis     Past Surgical History:  Procedure Laterality Date  . no prior surgery      Current Outpatient Prescriptions on File Prior to Visit  Medication Sig Dispense Refill  . amLODipine (NORVASC) 5 MG tablet TAKE 1 TABLET DAILY 90 tablet 1  . aspirin 81 MG tablet Take 81 mg by mouth daily.      . carvedilol (COREG) 12.5 MG tablet TAKE 1 TABLET TWICE A DAY WITH MEALS 180 tablet 3  . losartan-hydrochlorothiazide (HYZAAR) 100-25 MG tablet TAKE 1 TABLET DAILY 90 tablet 0  . metFORMIN (GLUCOPHAGE-XR) 500 MG 24 hr tablet TAKE 2 TABLETS TWICE A DAY 360 tablet 3   No current facility-administered medications on file prior to visit.     No Known Allergies  Family History  Problem Relation Age of Onset  . Cancer Sister     Colon  . Aneurysm Sister     CNS  . Intracerebral hemorrhage Sister   . Colon cancer Sister 59  . Cancer Mother     Breast  . Heart disease Mother     CVA x 2    Social History   Social History  . Marital  status: Married    Spouse name: N/A  . Number of children: N/A  . Years of education: N/A   Occupational History  . Not on file.   Social History Main Topics  . Smoking status: Never Smoker  . Smokeless tobacco: Never Used  . Alcohol use No  . Drug use: No  . Sexual activity: Not on file   Other Topics Concern  . Not on file   Social History Narrative  . No narrative on file   Review of Systems  Constitutional: Negative for fever and weight loss.  HENT: Negative for ear discharge, ear pain, hearing loss and tinnitus.   Eyes: Negative for blurred vision, double vision, photophobia and pain.  Respiratory: Negative for cough and shortness of breath.   Cardiovascular: Negative for chest pain and palpitations.  Gastrointestinal: Negative for abdominal pain, blood in stool, constipation, diarrhea, heartburn, melena, nausea and vomiting.  Genitourinary: Negative for dysuria, flank pain, frequency, hematuria and urgency.  Musculoskeletal: Positive for joint pain and neck pain. Negative for falls.  Neurological: Negative for dizziness, loss of consciousness and headaches.  Endo/Heme/Allergies: Negative for environmental allergies.  Psychiatric/Behavioral: Negative for depression, hallucinations, substance abuse and suicidal ideas. The patient is not nervous/anxious and does not have insomnia.    BP 128/82   Pulse 66   Temp 98.5 F (36.9  C) (Oral)   Resp 16   Ht 5' 1.5" (1.562 m)   Wt 225 lb (102.1 kg)   SpO2 95%   BMI 41.82 kg/m   Physical Exam  Constitutional: She is oriented to person, place, and time and well-developed, well-nourished, and in no distress.  HENT:  Head: Normocephalic and atraumatic.  Right Ear: External ear normal.  Left Ear: External ear normal.  Nose: Nose normal.  Mouth/Throat: Oropharyngeal exudate present.  TM within normal limits bilaterally  Eyes: Conjunctivae are normal.  Neck: Neck supple.  Cardiovascular: Normal rate, regular rhythm, normal  heart sounds and intact distal pulses.   Pulmonary/Chest: Effort normal and breath sounds normal. No respiratory distress. She has no wheezes. She has no rales. She exhibits no tenderness.  Musculoskeletal: Normal range of motion. She exhibits no tenderness.  Neurological: She is alert and oriented to person, place, and time.  Skin: Skin is warm and dry. No rash noted.  Psychiatric: Affect normal.  Vitals reviewed.   Recent Results (from the past 2160 hour(s))  Hepatic function panel     Status: Abnormal   Collection Time: 04/10/16  3:24 PM  Result Value Ref Range   Total Bilirubin 0.6 0.2 - 1.2 mg/dL   Bilirubin, Direct 0.1 0.0 - 0.3 mg/dL   Alkaline Phosphatase 95 39 - 117 U/L   AST 45 (H) 0 - 37 U/L   ALT 42 (H) 0 - 35 U/L   Total Protein 7.2 6.0 - 8.3 g/dL   Albumin 4.0 3.5 - 5.2 g/dL  CBC with Differential/Platelet     Status: None   Collection Time: 04/10/16  3:24 PM  Result Value Ref Range   WBC 9.6 4.0 - 10.5 K/uL   RBC 4.65 3.87 - 5.11 Mil/uL   Hemoglobin 13.8 12.0 - 15.0 g/dL   HCT 41.5 36.0 - 46.0 %   MCV 89.1 78.0 - 100.0 fl   MCHC 33.2 30.0 - 36.0 g/dL   RDW 13.5 11.5 - 15.5 %   Platelets 281.0 150.0 - 400.0 K/uL   Neutrophils Relative % 58.7 43.0 - 77.0 %   Lymphocytes Relative 31.9 12.0 - 46.0 %   Monocytes Relative 5.8 3.0 - 12.0 %   Eosinophils Relative 2.8 0.0 - 5.0 %   Basophils Relative 0.8 0.0 - 3.0 %   Neutro Abs 5.6 1.4 - 7.7 K/uL   Lymphs Abs 3.1 0.7 - 4.0 K/uL   Monocytes Absolute 0.6 0.1 - 1.0 K/uL   Eosinophils Absolute 0.3 0.0 - 0.7 K/uL   Basophils Absolute 0.1 0.0 - 0.1 K/uL  Urinalysis, Routine w reflex microscopic     Status: None   Collection Time: 04/10/16  3:24 PM  Result Value Ref Range   Color, Urine YELLOW Yellow;Lt. Yellow   APPearance CLEAR Clear   Specific Gravity, Urine 1.025 1.000 - 1.030   pH 5.5 5.0 - 8.0   Total Protein, Urine NEGATIVE Negative   Urine Glucose NEGATIVE Negative   Ketones, ur NEGATIVE Negative   Bilirubin  Urine NEGATIVE Negative   Hgb urine dipstick NEGATIVE Negative   Urobilinogen, UA 0.2 0.0 - 1.0   Leukocytes, UA NEGATIVE Negative   Nitrite NEGATIVE Negative   Squamous Epithelial / LPF Rare(0-4/hpf) Rare(0-4/hpf)  Lipid panel     Status: Abnormal   Collection Time: 04/10/16  3:24 PM  Result Value Ref Range   Cholesterol 121 0 - 200 mg/dL    Comment: ATP III Classification       Desirable:  <  200 mg/dL               Borderline High:  200 - 239 mg/dL          High:  > = 240 mg/dL   Triglycerides 85.0 0.0 - 149.0 mg/dL    Comment: Normal:  <150 mg/dLBorderline High:  150 - 199 mg/dL   HDL 36.90 (L) >39.00 mg/dL   VLDL 17.0 0.0 - 40.0 mg/dL   LDL Cholesterol 67 0 - 99 mg/dL   Total CHOL/HDL Ratio 3     Comment:                Men          Women1/2 Average Risk     3.4          3.3Average Risk          5.0          4.42X Average Risk          9.6          7.13X Average Risk          15.0          11.0                       NonHDL 84.16     Comment: NOTE:  Non-HDL goal should be 30 mg/dL higher than patient's LDL goal (i.e. LDL goal of < 70 mg/dL, would have non-HDL goal of < 100 mg/dL)  Microalbumin / creatinine urine ratio     Status: None   Collection Time: 04/10/16  3:24 PM  Result Value Ref Range   Microalb, Ur <0.7 0.0 - 1.9 mg/dL   Creatinine,U 120.9 mg/dL   Microalb Creat Ratio 0.6 0.0 - 30.0 mg/g  Basic metabolic panel     Status: None   Collection Time: 04/10/16  3:24 PM  Result Value Ref Range   Sodium 139 135 - 145 mEq/L   Potassium 4.2 3.5 - 5.1 mEq/L   Chloride 101 96 - 112 mEq/L   CO2 28 19 - 32 mEq/L   Glucose, Bld 87 70 - 99 mg/dL   BUN 11 6 - 23 mg/dL   Creatinine, Ser 0.84 0.40 - 1.20 mg/dL   Calcium 10.1 8.4 - 10.5 mg/dL   GFR 86.86 >60.00 mL/min  TSH     Status: None   Collection Time: 04/10/16  3:24 PM  Result Value Ref Range   TSH 1.62 0.35 - 4.50 uIU/mL  Hepatitis C antibody     Status: None   Collection Time: 04/10/16  3:24 PM  Result Value Ref Range     HCV Ab NEGATIVE NEGATIVE  POCT glycosylated hemoglobin (Hb A1C)     Status: None   Collection Time: 04/10/16  4:59 PM  Result Value Ref Range   Hemoglobin A1C 6.3     Assessment/Plan: Essential hypertension BP stable today.  Tolerating medications well.  Recent labs reviewed and stable. Continue current regimen. FU discussed.  Need for 23-polyvalent pneumococcal polysaccharide vaccine Pneumovax given today.  Neck pain on right side Only occurring at night time when laying on her R side. Discussed sleep hygiene and positioning. Discussed proper pillow support to help with spinal alignment. Supportive measures and OTC medications discussed. FU if not improving.     Leeanne Rio, PA-C

## 2016-05-30 NOTE — Progress Notes (Signed)
Pre visit review using our clinic review tool, if applicable. No additional management support is needed unless otherwise documented below in the visit note. 

## 2016-05-30 NOTE — Patient Instructions (Signed)
Please apply Salon Pas or Icy Hot to the R shoulder and neck before bed.  Would recommend you avoid sleeping on the affected side. Also recommend considering a new mattress giving your mention that your current one is over 68 years old.  Please continue chronic medications as directed. Follow-up with me in 6 months. Follow-up with Dr. Loanne Drilling is scheduled.

## 2016-06-02 DIAGNOSIS — Z23 Encounter for immunization: Secondary | ICD-10-CM | POA: Insufficient documentation

## 2016-06-02 DIAGNOSIS — M542 Cervicalgia: Secondary | ICD-10-CM | POA: Insufficient documentation

## 2016-06-02 NOTE — Assessment & Plan Note (Signed)
Pneumovax given today

## 2016-06-02 NOTE — Assessment & Plan Note (Signed)
BP stable today.  Tolerating medications well.  Recent labs reviewed and stable. Continue current regimen. FU discussed.

## 2016-06-02 NOTE — Assessment & Plan Note (Signed)
Only occurring at night time when laying on her R side. Discussed sleep hygiene and positioning. Discussed proper pillow support to help with spinal alignment. Supportive measures and OTC medications discussed. FU if not improving.

## 2016-07-18 ENCOUNTER — Other Ambulatory Visit: Payer: Self-pay | Admitting: Emergency Medicine

## 2016-07-18 MED ORDER — LOSARTAN POTASSIUM-HCTZ 100-25 MG PO TABS: 1.0000 | ORAL_TABLET | Freq: Every day | ORAL | 1 refills | Status: DC

## 2016-07-20 ENCOUNTER — Other Ambulatory Visit: Payer: Self-pay | Admitting: Emergency Medicine

## 2016-07-21 MED ORDER — METFORMIN HCL ER 500 MG PO TB24: 1000.0000 mg | ORAL_TABLET | Freq: Two times a day (BID) | ORAL | 3 refills | Status: DC

## 2016-10-22 ENCOUNTER — Encounter: Payer: Self-pay | Admitting: Endocrinology

## 2016-10-22 ENCOUNTER — Ambulatory Visit (INDEPENDENT_AMBULATORY_CARE_PROVIDER_SITE_OTHER): Payer: Commercial Managed Care - HMO | Admitting: Endocrinology

## 2016-10-22 VITALS — BP 134/84 | HR 105 | Ht 61.5 in | Wt 228.0 lb

## 2016-10-22 DIAGNOSIS — R945 Abnormal results of liver function studies: Secondary | ICD-10-CM | POA: Diagnosis not present

## 2016-10-22 DIAGNOSIS — I10 Essential (primary) hypertension: Secondary | ICD-10-CM

## 2016-10-22 DIAGNOSIS — K769 Liver disease, unspecified: Secondary | ICD-10-CM | POA: Insufficient documentation

## 2016-10-22 DIAGNOSIS — E119 Type 2 diabetes mellitus without complications: Secondary | ICD-10-CM

## 2016-10-22 DIAGNOSIS — Z0000 Encounter for general adult medical examination without abnormal findings: Secondary | ICD-10-CM

## 2016-10-22 LAB — HEPATIC FUNCTION PANEL
ALBUMIN: 4.2 g/dL (ref 3.5–5.2)
ALK PHOS: 98 U/L (ref 39–117)
ALT: 46 U/L — AB (ref 0–35)
AST: 42 U/L — AB (ref 0–37)
Bilirubin, Direct: 0.1 mg/dL (ref 0.0–0.3)
TOTAL PROTEIN: 7.1 g/dL (ref 6.0–8.3)
Total Bilirubin: 0.4 mg/dL (ref 0.2–1.2)

## 2016-10-22 LAB — POCT GLYCOSYLATED HEMOGLOBIN (HGB A1C): HEMOGLOBIN A1C: 6.4

## 2016-10-22 NOTE — Patient Instructions (Addendum)
Please consider these measures for your health:  minimize alcohol.  Do not use tobacco products.  Have a colonoscopy at least every 10 years from age 66.  Women should have an annual mammogram from age 15.  Keep firearms safely stored.  Always use seat belts.  have working smoke alarms in your home.  See an eye doctor and dentist regularly.  Never drive under the influence of alcohol or drugs (including prescription drugs).   please let me know what your wishes would be, if artificial life support measures should become necessary.  It is critically important to prevent falling down (keep floor areas well-lit, dry, and free of loose objects.  If you have a cane, walker, or wheelchair, you should use it, even for short trips around the house.  Wear flat-soled shoes.  Also, try not to rush).  blood tests are requested for you today.  We'll let you know about the results.  good diet and exercise significantly improves your health.  please let me know if you wish to be referred to a dietician.  high blood sugar is very risky to your health.  you should see an eye doctor and dentist every year.  It is very important to get all recommended vaccinations.  Please come back for a follow-up appointment in 6 months I'll drop back and take care of just the diabetes.

## 2016-10-22 NOTE — Progress Notes (Signed)
we discussed code status.  pt requests full code, but would not want to be started or maintained on artificial life-support measures if there was not a reasonable chance of recovery 

## 2016-10-22 NOTE — Progress Notes (Signed)
Subjective:    Patient ID: Jennifer Saunders, female    DOB: Apr 21, 1951, 66 y.o.   MRN: [id number]  HPI Pt returns for f/u of diabetes mellitus: DM type: 2 Dx'ed: 1696 Complications: TIA Therapy: metformin GDM: never DKA: never Severe hypoglycemia: never Pancreatitis: never Pancreatic imaging: never Other: she has never been on insulin.   Interval history: pt states she feels well in general.   Past Medical History:  Diagnosis Date  . Adenomatous colon polyp 2003  . Anxiety   . Diabetes mellitus   . Dyslipidemia   . GERD (gastroesophageal reflux disease)   . Hypertension   . Urolithiasis     Past Surgical History:  Procedure Laterality Date  . no prior surgery      Social History   Social History  . Marital status: Married    Spouse name: N/A  . Number of children: N/A  . Years of education: N/A   Occupational History  . Not on file.   Social History Main Topics  . Smoking status: Never Smoker  . Smokeless tobacco: Never Used  . Alcohol use No  . Drug use: No  . Sexual activity: Not on file   Other Topics Concern  . Not on file   Social History Narrative  . No narrative on file    Current Outpatient Prescriptions on File Prior to Visit  Medication Sig Dispense Refill  . amLODipine (NORVASC) 5 MG tablet TAKE 1 TABLET DAILY 90 tablet 1  . aspirin 81 MG tablet Take 81 mg by mouth daily.      . carvedilol (COREG) 12.5 MG tablet TAKE 1 TABLET TWICE A DAY WITH MEALS 180 tablet 3  . losartan-hydrochlorothiazide (HYZAAR) 100-25 MG tablet Take 1 tablet by mouth daily. 90 tablet 1  . metFORMIN (GLUCOPHAGE-XR) 500 MG 24 hr tablet Take 2 tablets (1,000 mg total) by mouth 2 (two) times daily. 360 tablet 3   No current facility-administered medications on file prior to visit.     No Known Allergies  Family History  Problem Relation Age of Onset  . Cancer Sister        Colon  . Aneurysm Sister        CNS  . Intracerebral hemorrhage Sister   . Colon  cancer Sister 56  . Cancer Mother        Breast  . Heart disease Mother        CVA x 2    BP 134/84   Pulse (!) 105   Ht 5' 1.5" (1.562 m)   Wt 228 lb (103.4 kg)   SpO2 91%   BMI 42.38 kg/m    Review of Systems Denies sob. She has gained weight    Objective:   Physical Exam VITAL SIGNS:  See vs page GENERAL: no distress.  Morbid obesity Pulses: dorsalis pedis intact bilat.   MSK: no deformity of the feet CV: trace bilat leg edema. Skin:  no ulcer on the feet.  normal color and temp on the feet. Neuro: sensation is intact to touch on the feet   A1c=6.4% Lab Results  Component Value Date   ALT 46 (H) 10/22/2016   AST 42 (H) 10/22/2016   ALKPHOS 98 10/22/2016   BILITOT 0.4 10/22/2016      Assessment & Plan:  Type 2 DM: well-controlled.  Please continue the same metformin HTN: well-controlled. Please continue the same medication elev transaminases, persistent, prob due to NASH. I advised weight loss.    Subjective:  Patient here for Medicare annual wellness visit and management of other chronic and acute problems.     Risk factors: advanced age    52 of Physicians Providing Medical Care to Patient:  See "snapshot"   Activities of Daily Living: In your present state of health, do you have any difficulty performing the following activities (lives with dtr)?:  Preparing food and eating?: No  Bathing yourself: No  Getting dressed: No  Using the toilet:No  Moving around from place to place: No  In the past year have you fallen or had a near fall?: No    Home Safety: Has smoke detector and wears seat belts. No firearms.    Diet and Exercise  Current exercise habits: pt says not very good.   Dietary issues discussed: pt reports a fairly healthy diet   Depression Screen  Q1: Over the past two weeks, have you felt down, depressed or hopeless?no  Q2: Over the past two weeks, have you felt little interest or pleasure in doing things? no   The following  portions of the patient's history were reviewed and updated as appropriate: allergies, current medications, past family history, past medical history, past social history, past surgical history and problem list.   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   Hearing: grossly normal Body mass index:  See vs page Msk: pt easily and quickly performs "get-up-and-go" from a sitting position Cognitive Impairment Assessment: cognition, memory and judgment appear normal.  remembers 3/3 at 5 minutes.  excellent recall.  can easily read and write a sentence.  alert and oriented x 3   Assessment:   Medicare wellness utd on preventive parameters    Plan:   During the course of the visit the patient was educated and counseled about appropriate screening and preventive services including:        Fall prevention   Screening mammography is UTD Bone densitometry screening is UTD Diabetes is addressed today Nutrition counseling is offered.    Vaccines are updated as needed  Patient Instructions (the written plan) was given to the patient.

## 2016-10-23 LAB — HEPATITIS B SURFACE ANTIBODY, QUANTITATIVE

## 2016-10-26 ENCOUNTER — Other Ambulatory Visit: Payer: Self-pay

## 2016-10-26 ENCOUNTER — Telehealth: Payer: Self-pay

## 2016-10-26 MED ORDER — CARVEDILOL 12.5 MG PO TABS: 12.5000 mg | ORAL_TABLET | Freq: Two times a day (BID) | ORAL | 3 refills | Status: DC

## 2016-10-26 NOTE — Telephone Encounter (Signed)
Called patient to verify that she wanted her Carvedilol from [facility] and she did. I sent this refill in for her.

## 2016-11-02 ENCOUNTER — Other Ambulatory Visit: Payer: Self-pay

## 2016-11-02 MED ORDER — CARVEDILOL 12.5 MG PO TABS: 12.5000 mg | ORAL_TABLET | Freq: Two times a day (BID) | ORAL | 3 refills | Status: DC

## 2016-12-27 ENCOUNTER — Other Ambulatory Visit: Payer: Self-pay | Admitting: Endocrinology

## 2016-12-27 DIAGNOSIS — Z1231 Encounter for screening mammogram for malignant neoplasm of breast: Secondary | ICD-10-CM

## 2017-01-08 ENCOUNTER — Telehealth: Payer: Self-pay | Admitting: Emergency Medicine

## 2017-01-08 NOTE — Telephone Encounter (Signed)
Spoke with patient daughter advising patient is due for her 6 month follow with PCP. Patient daughter will have her call back to schedule an appointment

## 2017-01-21 ENCOUNTER — Ambulatory Visit
Admission: RE | Admit: 2017-01-21 | Discharge: 2017-01-21 | Disposition: A | Discharge status: Home / Self Care | Payer: Commercial Managed Care - HMO | Source: Ambulatory Visit | Attending: Endocrinology | Admitting: Endocrinology

## 2017-01-21 DIAGNOSIS — Z1231 Encounter for screening mammogram for malignant neoplasm of breast: Secondary | ICD-10-CM | POA: Diagnosis not present

## 2017-03-07 ENCOUNTER — Ambulatory Visit (INDEPENDENT_AMBULATORY_CARE_PROVIDER_SITE_OTHER): Payer: Commercial Managed Care - HMO

## 2017-03-07 DIAGNOSIS — Z23 Encounter for immunization: Secondary | ICD-10-CM | POA: Diagnosis not present

## 2017-03-26 ENCOUNTER — Telehealth (HOSPITAL_COMMUNITY): Payer: Self-pay

## 2017-03-26 ENCOUNTER — Other Ambulatory Visit: Payer: Self-pay | Admitting: Physician Assistant

## 2017-03-26 MED ORDER — AMLODIPINE BESYLATE 5 MG PO TABS: 5.0000 mg | ORAL_TABLET | Freq: Every day | ORAL | 0 refills | Status: DC

## 2017-03-26 NOTE — Telephone Encounter (Unsigned)
Copied from [place]. Topic: Inquiry >> Mar 26, 2017 10:46 AM Malena Catholic I, NT wrote: Reason for CRM: Pt call For Rx Refill For Norvasc 5 Mg she use [facility]

## 2017-03-26 NOTE — Telephone Encounter (Signed)
Called pt to ask her to make an appt for CPE. Pt requesting Norvasc.

## 2017-03-26 NOTE — Telephone Encounter (Signed)
Norvasc refilled x 1 month  Pt to be seen 05/06/17 @ 0900

## 2017-04-17 ENCOUNTER — Ambulatory Visit: Payer: Commercial Managed Care - HMO | Admitting: Endocrinology

## 2017-04-17 ENCOUNTER — Encounter: Payer: Self-pay | Admitting: Endocrinology

## 2017-04-17 VITALS — BP 120/72 | HR 72 | Wt 232.6 lb

## 2017-04-17 DIAGNOSIS — E119 Type 2 diabetes mellitus without complications: Secondary | ICD-10-CM

## 2017-04-17 LAB — POCT GLYCOSYLATED HEMOGLOBIN (HGB A1C): HEMOGLOBIN A1C: 6.3

## 2017-04-17 NOTE — Progress Notes (Signed)
Subjective:    Patient ID: Jennifer Saunders, female    DOB: 1951/03/09, 66 y.o.   MRN: [id number]  HPI Pt returns for f/u of diabetes mellitus: DM type: 2 Dx'ed: 2353 Complications: TIA Therapy: metformin GDM: never DKA: never Severe hypoglycemia: never Pancreatitis: never Pancreatic imaging: never Other: she has never been on insulin.   Interval history: pt states she feels well in general.   Past Medical History:  Diagnosis Date  . Adenomatous colon polyp 2003  . Anxiety   . Diabetes mellitus   . Dyslipidemia   . GERD (gastroesophageal reflux disease)   . Hypertension   . Urolithiasis     Past Surgical History:  Procedure Laterality Date  . no prior surgery      Social History   Socioeconomic History  . Marital status: Married    Spouse name: Not on file  . Number of children: Not on file  . Years of education: Not on file  . Highest education level: Not on file  Social Needs  . Financial resource strain: Not on file  . Food insecurity - worry: Not on file  . Food insecurity - inability: Not on file  . Transportation needs - medical: Not on file  . Transportation needs - non-medical: Not on file  Occupational History  . Not on file  Tobacco Use  . Smoking status: Never Smoker  . Smokeless tobacco: Never Used  Substance and Sexual Activity  . Alcohol use: No    Alcohol/week: 0.0 oz  . Drug use: No  . Sexual activity: Not on file  Other Topics Concern  . Not on file  Social History Narrative  . Not on file    Current Outpatient Medications on File Prior to Visit  Medication Sig Dispense Refill  . amLODipine (NORVASC) 5 MG tablet Take 1 tablet (5 mg total) by mouth daily. 30 tablet 0  . aspirin 81 MG tablet Take 81 mg by mouth daily.      . carvedilol (COREG) 12.5 MG tablet Take 1 tablet (12.5 mg total) by mouth 2 (two) times daily with a meal. 180 tablet 3  . losartan-hydrochlorothiazide (HYZAAR) 100-25 MG tablet TAKE 1 TABLET EVERY DAY 90 tablet  0  . metFORMIN (GLUCOPHAGE-XR) 500 MG 24 hr tablet Take 2 tablets (1,000 mg total) by mouth 2 (two) times daily. 360 tablet 3   No current facility-administered medications on file prior to visit.     No Known Allergies  Family History  Problem Relation Age of Onset  . Cancer Sister        Colon  . Aneurysm Sister        CNS  . Intracerebral hemorrhage Sister   . Colon cancer Sister 8  . Cancer Mother        Breast  . Heart disease Mother        CVA x 2    BP 120/72 (BP Location: Left Arm, Patient Position: Sitting, Cuff Size: Normal)   Pulse 72   Wt 232 lb 9.6 oz (105.5 kg)   SpO2 92%   BMI 43.24 kg/m    Review of Systems She denies hypoglycemia    Objective:   Physical Exam VITAL SIGNS:  See vs page GENERAL: no distress.  Morbid obesity Pulses: dorsalis pedis intact bilat.   MSK: no deformity of the feet CV: trace bilat leg edema. Skin:  no ulcer on the feet.  normal color and temp on the feet. Neuro: sensation is intact  to touch on the feet  Lab Results  Component Value Date   HGBA1C 6.3 04/17/2017   Lab Results  Component Value Date   CREATININE 0.84 04/10/2016   BUN 11 04/10/2016   NA 139 04/10/2016   K 4.2 04/10/2016   CL 101 04/10/2016   CO2 28 04/10/2016       Assessment & Plan:  Type 2 DM: well-controlled.  TIA, by hx: she should avoid hypoglycemia.   Patient Instructions  check your blood sugar once a day.  vary the time of day when you check, between before the 3 meals, and at bedtime.  also check if you have symptoms of your blood sugar being too high or too low.  please keep a record of the readings and bring it to your next appointment here (or you can bring the meter itself).  You can write it on any piece of paper.  please call us sooner if your blood sugar goes below 70, or if you have a lot of readings over 200. Please come back for a follow-up appointment in 6 months.

## 2017-04-17 NOTE — Patient Instructions (Addendum)
check your blood sugar once a day.  vary the time of day when you check, between before the 3 meals, and at bedtime.  also check if you have symptoms of your blood sugar being too high or too low.  please keep a record of the readings and bring it to your next appointment here (or you can bring the meter itself).  You can write it on any piece of paper.  please call us sooner if your blood sugar goes below 70, or if you have a lot of readings over 200.   Please come back for a follow-up appointment in 6 months.   

## 2017-04-30 HISTORY — PX: COLONOSCOPY: SHX174

## 2017-04-30 HISTORY — PX: POLYPECTOMY: SHX149

## 2017-05-06 ENCOUNTER — Encounter: Payer: Self-pay | Admitting: Physician Assistant

## 2017-05-06 ENCOUNTER — Ambulatory Visit (INDEPENDENT_AMBULATORY_CARE_PROVIDER_SITE_OTHER): Payer: Medicare HMO | Admitting: Physician Assistant

## 2017-05-06 VITALS — BP 130/82 | HR 90 | Temp 98.0°F | Resp 14 | Ht 62.0 in | Wt 230.0 lb

## 2017-05-06 DIAGNOSIS — I10 Essential (primary) hypertension: Secondary | ICD-10-CM | POA: Diagnosis not present

## 2017-05-06 DIAGNOSIS — K7581 Nonalcoholic steatohepatitis (NASH): Secondary | ICD-10-CM | POA: Diagnosis not present

## 2017-05-06 LAB — COMPREHENSIVE METABOLIC PANEL
ALK PHOS: 84 U/L (ref 39–117)
ALT: 43 U/L — AB (ref 0–35)
AST: 31 U/L (ref 0–37)
Albumin: 4 g/dL (ref 3.5–5.2)
BILIRUBIN TOTAL: 0.8 mg/dL (ref 0.2–1.2)
BUN: 14 mg/dL (ref 6–23)
CO2: 33 mEq/L — ABNORMAL HIGH (ref 19–32)
Calcium: 10 mg/dL (ref 8.4–10.5)
Chloride: 99 mEq/L (ref 96–112)
Creatinine, Ser: 0.82 mg/dL (ref 0.40–1.20)
GFR: 89.03 mL/min (ref >60.00)
Glucose, Bld: 124 mg/dL — ABNORMAL HIGH (ref 70–99)
Potassium: 4.1 mEq/L (ref 3.5–5.1)
SODIUM: 139 meq/L (ref 135–145)
TOTAL PROTEIN: 6.7 g/dL (ref 6.0–8.3)

## 2017-05-06 LAB — LIPID PANEL
Cholesterol: 108 mg/dL (ref 0–200)
HDL: 30.5 mg/dL — ABNORMAL LOW (ref >39.00)
LDL Cholesterol: 59 mg/dL (ref 0–99)
NONHDL: 77.91
Total CHOL/HDL Ratio: 4
Triglycerides: 93 mg/dL (ref 0.0–149.0)
VLDL: 18.6 mg/dL (ref 0.0–40.0)

## 2017-05-06 NOTE — Assessment & Plan Note (Signed)
Well-controlled. Asymptomatic. Has started watching intake and exercising regularly, which is to be commended.  Continue current regimen. Will check CMP and lipids today.

## 2017-05-06 NOTE — Patient Instructions (Signed)
Please go to the lab today for blood work.  I will call you with your results. We will alter treatment regimen(s) if indicated by your results.   Continue current medications for now. Keep up with the lifestyle changes you have made!  Follow-up in 6 months unless directed otherwise when we call with your lab results.  Return sooner as needed.   Managing Your Hypertension Hypertension is commonly called high blood pressure. This is when the force of your blood pressing against the walls of your arteries is too strong. Arteries are blood vessels that carry blood from your heart throughout your body. Hypertension forces the heart to work harder to pump blood, and may cause the arteries to become narrow or stiff. Having untreated or uncontrolled hypertension can cause heart attack, stroke, kidney disease, and other problems. What are blood pressure readings? A blood pressure reading consists of a higher number over a lower number. Ideally, your blood pressure should be below 120/80. The first ("top") number is called the systolic pressure. It is a measure of the pressure in your arteries as your heart beats. The second ("bottom") number is called the diastolic pressure. It is a measure of the pressure in your arteries as the heart relaxes. What does my blood pressure reading mean? Blood pressure is classified into four stages. Based on your blood pressure reading, your health care provider may use the following stages to determine what type of treatment you need, if any. Systolic pressure and diastolic pressure are measured in a unit called mm Hg. Normal  Systolic pressure: below 664.  Diastolic pressure: below 80. Elevated  Systolic pressure: 403-474.  Diastolic pressure: below 80. Hypertension stage 1  Systolic pressure: 259-563.  Diastolic pressure: 87-56. Hypertension stage 2  Systolic pressure: 433 or above.  Diastolic pressure: 90 or above. What health risks are associated with  hypertension? Managing your hypertension is an important responsibility. Uncontrolled hypertension can lead to:  A heart attack.  A stroke.  A weakened blood vessel (aneurysm).  Heart failure.  Kidney damage.  Eye damage.  Metabolic syndrome.  Memory and concentration problems.  What changes can I make to manage my hypertension? Hypertension can be managed by making lifestyle changes and possibly by taking medicines. Your health care provider will help you make a plan to bring your blood pressure within a normal range. Eating and drinking  Eat a diet that is high in fiber and potassium, and low in salt (sodium), added sugar, and fat. An example eating plan is called the DASH (Dietary Approaches to Stop Hypertension) diet. To eat this way: ? Eat plenty of fresh fruits and vegetables. Try to fill half of your plate at each meal with fruits and vegetables. ? Eat whole grains, such as whole wheat pasta, brown rice, or whole grain bread. Fill about one quarter of your plate with whole grains. ? Eat low-fat diary products. ? Avoid fatty cuts of meat, processed or cured meats, and poultry with skin. Fill about one quarter of your plate with lean proteins such as fish, chicken without skin, beans, eggs, and tofu. ? Avoid premade and processed foods. These tend to be higher in sodium, added sugar, and fat.  Reduce your daily sodium intake. Most people with hypertension should eat less than 1,500 mg of sodium a day.  Limit alcohol intake to no more than 1 drink a day for nonpregnant women and 2 drinks a day for men. One drink equals 12 oz of beer, 5 oz of  wine, or 1 oz of hard liquor. Lifestyle  Work with your health care provider to maintain a healthy body weight, or to lose weight. Ask what an ideal weight is for you.  Get at least 30 minutes of exercise that causes your heart to beat faster (aerobic exercise) most days of the week. Activities may include walking, swimming, or  biking.  Include exercise to strengthen your muscles (resistance exercise), such as weight lifting, as part of your weekly exercise routine. Try to do these types of exercises for 30 minutes at least 3 days a week.  Do not use any products that contain nicotine or tobacco, such as cigarettes and e-cigarettes. If you need help quitting, ask your health care provider.  Control any long-term (chronic) conditions you have, such as high cholesterol or diabetes. Monitoring  Monitor your blood pressure at home as told by your health care provider. Your personal target blood pressure may vary depending on your medical conditions, your age, and other factors.  Have your blood pressure checked regularly, as often as told by your health care provider. Working with your health care provider  Review all the medicines you take with your health care provider because there may be side effects or interactions.  Talk with your health care provider about your diet, exercise habits, and other lifestyle factors that may be contributing to hypertension.  Visit your health care provider regularly. Your health care provider can help you create and adjust your plan for managing hypertension. Will I need medicine to control my blood pressure? Your health care provider may prescribe medicine if lifestyle changes are not enough to get your blood pressure under control, and if:  Your systolic blood pressure is 130 or higher.  Your diastolic blood pressure is 80 or higher.  Take medicines only as told by your health care provider. Follow the directions carefully. Blood pressure medicines must be taken as prescribed. The medicine does not work as well when you skip doses. Skipping doses also puts you at risk for problems. Contact a health care provider if:  You think you are having a reaction to medicines you have taken.  You have repeated (recurrent) headaches.  You feel dizzy.  You have swelling in your  ankles.  You have trouble with your vision. Get help right away if:  You develop a severe headache or confusion.  You have unusual weakness or numbness, or you feel faint.  You have severe pain in your chest or abdomen.  You vomit repeatedly.  You have trouble breathing. Summary  Hypertension is when the force of blood pumping through your arteries is too strong. If this condition is not controlled, it may put you at risk for serious complications.  Your personal target blood pressure may vary depending on your medical conditions, your age, and other factors. For most people, a normal blood pressure is less than 120/80.  Hypertension is managed by lifestyle changes, medicines, or both. Lifestyle changes include weight loss, eating a healthy, low-sodium diet, exercising more, and limiting alcohol. This information is not intended to replace advice given to you by your health care provider. Make sure you discuss any questions you have with your health care provider. Document Released: 01/09/2012 Document Revised: 03/14/2016 Document Reviewed: 03/14/2016 Elsevier Interactive Patient Education  [employer].

## 2017-05-06 NOTE — Progress Notes (Signed)
Patient presents to clinic today for follow-up of hypertension. Is currently on a regimen of amlodipine 5 mg daily and losartan-hctz 100-25 mg daily, carvedilol 12.5 mg BID. Is taking as directed. Denies side effect. Patient denies chest pain, palpitations, lightheadedness, dizziness, vision changes or frequent headaches. Has diabetes, followed by Endocrinology with last A1C at 6.3. Is working on diet and exercise. Is overdue for lipid panel and is fasting today.   Past Medical History:  Diagnosis Date  . Adenomatous colon polyp 2003  . Anxiety   . Diabetes mellitus   . Dyslipidemia   . GERD (gastroesophageal reflux disease)   . Hypertension   . Urolithiasis     Current Outpatient Medications on File Prior to Visit  Medication Sig Dispense Refill  . amLODipine (NORVASC) 5 MG tablet Take 1 tablet (5 mg total) by mouth daily. 30 tablet 0  . aspirin 81 MG tablet Take 81 mg by mouth daily.      . carvedilol (COREG) 12.5 MG tablet Take 1 tablet (12.5 mg total) by mouth 2 (two) times daily with a meal. 180 tablet 3  . losartan-hydrochlorothiazide (HYZAAR) 100-25 MG tablet TAKE 1 TABLET EVERY DAY 90 tablet 0  . metFORMIN (GLUCOPHAGE-XR) 500 MG 24 hr tablet Take 2 tablets (1,000 mg total) by mouth 2 (two) times daily. 360 tablet 3  . triamcinolone (KENALOG) 0.025 % cream Apply 1 application topically 2 (two) times daily.     No current facility-administered medications on file prior to visit.     No Known Allergies  Family History  Problem Relation Age of Onset  . Cancer Sister        Colon  . Aneurysm Sister        CNS  . Intracerebral hemorrhage Sister   . Colon cancer Sister 72  . Cancer Mother        Breast  . Heart disease Mother        CVA x 2    Social History   Socioeconomic History  . Marital status: Married    Spouse name: None  . Number of children: None  . Years of education: None  . Highest education level: None  Social Needs  . Financial resource strain:  None  . Food insecurity - worry: None  . Food insecurity - inability: None  . Transportation needs - medical: None  . Transportation needs - non-medical: None  Occupational History  . None  Tobacco Use  . Smoking status: Never Smoker  . Smokeless tobacco: Never Used  Substance and Sexual Activity  . Alcohol use: No    Alcohol/week: 0.0 oz  . Drug use: No  . Sexual activity: None  Other Topics Concern  . None  Social History Narrative  . None   Review of Systems - See HPI.  All other ROS are negative.  BP 130/82   Pulse 90   Temp 98 F (36.7 C) (Oral)   Resp 14   Ht 5\' 2"  (1.575 m)   Wt 230 lb (104.3 kg)   SpO2 94%   BMI 42.07 kg/m   Physical Exam  Constitutional: She is oriented to person, place, and time and well-developed, well-nourished, and in no distress.  HENT:  Head: Normocephalic and atraumatic.  Cardiovascular: Normal rate, regular rhythm, normal heart sounds and intact distal pulses.  Pulmonary/Chest: Effort normal and breath sounds normal. No respiratory distress. She has no wheezes. She has no rales. She exhibits no tenderness.  Neurological: She is alert and  oriented to person, place, and time.  Skin: Skin is warm and dry.  Psychiatric: Affect normal.  Vitals reviewed.  Recent Results (from the past 2160 hour(s))  POCT HgB A1C     Status: None   Collection Time: 04/17/17 11:38 AM  Result Value Ref Range   Hemoglobin A1C 6.3     Assessment/Plan: Essential hypertension Well-controlled. Asymptomatic. Has started watching intake and exercising regularly, which is to be commended.  Continue current regimen. Will check CMP and lipids today.     Leeanne Rio, PA-C

## 2017-07-11 ENCOUNTER — Other Ambulatory Visit: Payer: Self-pay | Admitting: Physician Assistant

## 2017-07-30 DIAGNOSIS — E119 Type 2 diabetes mellitus without complications: Secondary | ICD-10-CM | POA: Diagnosis not present

## 2017-07-30 DIAGNOSIS — H524 Presbyopia: Secondary | ICD-10-CM | POA: Diagnosis not present

## 2017-07-30 DIAGNOSIS — H1851 Endothelial corneal dystrophy: Secondary | ICD-10-CM | POA: Diagnosis not present

## 2017-07-30 DIAGNOSIS — H52223 Regular astigmatism, bilateral: Secondary | ICD-10-CM | POA: Diagnosis not present

## 2017-07-30 DIAGNOSIS — H2513 Age-related nuclear cataract, bilateral: Secondary | ICD-10-CM | POA: Diagnosis not present

## 2017-07-30 LAB — HM DIABETES EYE EXAM

## 2017-08-05 ENCOUNTER — Other Ambulatory Visit: Payer: Self-pay | Admitting: Endocrinology

## 2017-08-05 ENCOUNTER — Other Ambulatory Visit: Payer: Self-pay | Admitting: Physician Assistant

## 2017-08-20 ENCOUNTER — Encounter: Payer: Self-pay | Admitting: Gastroenterology

## 2017-09-03 ENCOUNTER — Encounter: Payer: Self-pay | Admitting: Gastroenterology

## 2017-10-11 ENCOUNTER — Other Ambulatory Visit: Payer: Self-pay | Admitting: Physician Assistant

## 2017-10-15 ENCOUNTER — Other Ambulatory Visit: Payer: Self-pay | Admitting: Physician Assistant

## 2017-10-28 ENCOUNTER — Encounter: Payer: Self-pay | Admitting: Endocrinology

## 2017-10-28 ENCOUNTER — Ambulatory Visit (INDEPENDENT_AMBULATORY_CARE_PROVIDER_SITE_OTHER): Payer: Medicare HMO | Admitting: Endocrinology

## 2017-10-28 VITALS — BP 132/82 | HR 93 | Wt 223.6 lb

## 2017-10-28 DIAGNOSIS — E119 Type 2 diabetes mellitus without complications: Secondary | ICD-10-CM | POA: Diagnosis not present

## 2017-10-28 LAB — POCT GLYCOSYLATED HEMOGLOBIN (HGB A1C): HEMOGLOBIN A1C: 6.4 % — AB (ref 4.0–5.6)

## 2017-10-28 MED ORDER — TRIAMCINOLONE ACETONIDE 0.025 % EX CREA: 1.0000 "application " | TOPICAL_CREAM | Freq: Four times a day (QID) | CUTANEOUS | 3 refills | Status: DC

## 2017-10-28 NOTE — Progress Notes (Signed)
Subjective:    Patient ID: Jennifer Saunders, female    DOB: 12-02-1950, 67 y.o.   MRN: [id number]  HPI Pt returns for f/u of diabetes mellitus: DM type: 2 Dx'ed: 5009 Complications: TIA Therapy: metformin GDM: never DKA: never Severe hypoglycemia: never Pancreatitis: never Pancreatic imaging: never Other: she has never been on insulin.   Interval history: pt states she feels well in general.   Past Medical History:  Diagnosis Date  . Adenomatous colon polyp 2003  . Anxiety   . Diabetes mellitus   . Dyslipidemia   . GERD (gastroesophageal reflux disease)   . Hypertension   . Urolithiasis     Past Surgical History:  Procedure Laterality Date  . no prior surgery      Social History   Socioeconomic History  . Marital status: Married    Spouse name: Not on file  . Number of children: Not on file  . Years of education: Not on file  . Highest education level: Not on file  Occupational History  . Not on file  Social Needs  . Financial resource strain: Not on file  . Food insecurity:    Worry: Not on file    Inability: Not on file  . Transportation needs:    Medical: Not on file    Non-medical: Not on file  Tobacco Use  . Smoking status: Never Smoker  . Smokeless tobacco: Never Used  Substance and Sexual Activity  . Alcohol use: No    Alcohol/week: 0.0 oz  . Drug use: No  . Sexual activity: Not on file  Lifestyle  . Physical activity:    Days per week: Not on file    Minutes per session: Not on file  . Stress: Not on file  Relationships  . Social connections:    Talks on phone: Not on file    Gets together: Not on file    Attends religious service: Not on file    Active member of club or organization: Not on file    Attends meetings of clubs or organizations: Not on file    Relationship status: Not on file  . Intimate partner violence:    Fear of current or ex partner: Not on file    Emotionally abused: Not on file    Physically abused: Not on file      Forced sexual activity: Not on file  Other Topics Concern  . Not on file  Social History Narrative  . Not on file    Current Outpatient Medications on File Prior to Visit  Medication Sig Dispense Refill  . amLODipine (NORVASC) 5 MG tablet TAKE 1 TABLET EVERY DAY 90 tablet 0  . aspirin 81 MG tablet Take 81 mg by mouth daily.      . carvedilol (COREG) 12.5 MG tablet Take 1 tablet (12.5 mg total) by mouth 2 (two) times daily with a meal. 180 tablet 3  . losartan-hydrochlorothiazide (HYZAAR) 100-25 MG tablet TAKE 1 TABLET EVERY DAY 90 tablet 1  . metFORMIN (GLUCOPHAGE-XR) 500 MG 24 hr tablet TAKE 2 TABLETS TWICE DAILY 360 tablet 3   No current facility-administered medications on file prior to visit.     No Known Allergies  Family History  Problem Relation Age of Onset  . Cancer Sister        Colon  . Aneurysm Sister        CNS  . Intracerebral hemorrhage Sister   . Colon cancer Sister 65  . Cancer Mother  Breast  . Heart disease Mother        CVA x 2    BP 132/82 (BP Location: Left Arm, Patient Position: Sitting, Cuff Size: Normal)   Pulse 93   Wt 223 lb 9.6 oz (101.4 kg)   SpO2 95%   BMI 40.90 kg/m    Review of Systems Rash on the legs has recurred    Objective:   Physical Exam VITAL SIGNS:  See vs page GENERAL: no distress Pulses: foot pulses are intact bilaterally.   MSK: no deformity of the feet or ankles, except for bilat bunions.  CV: trace bilat edema of the legs.   Skin:  no ulcer on the feet or ankles.  normal color and temp on the feet and ankles.  Minimal eczematous rash on the legs.   Neuro: sensation is intact to touch on the feet and ankles.    Lab Results  Component Value Date   HGBA1C 6.4 (A) 10/28/2017   Lab Results  Component Value Date   CREATININE 0.82 05/06/2017   BUN 14 05/06/2017   NA 139 05/06/2017   K 4.1 05/06/2017   CL 99 05/06/2017   CO2 33 (H) 05/06/2017      Assessment & Plan:  Type 2 DM, with TIA:  well-controlled.  Edema: this limits rx options.  Rash: recurrent.   Patient Instructions  I have sent a prescription to your pharmacy, to refill the skin cream Please continue the same metformin. Please come back for a follow-up appointment in 6 months.

## 2017-10-28 NOTE — Patient Instructions (Addendum)
I have sent a prescription to your pharmacy, to refill the skin cream Please continue the same metformin. Please come back for a follow-up appointment in 6 months.

## 2017-10-29 ENCOUNTER — Ambulatory Visit (AMBULATORY_SURGERY_CENTER): Payer: Self-pay

## 2017-10-29 VITALS — Ht 62.0 in | Wt 223.4 lb

## 2017-10-29 DIAGNOSIS — Z800 Family history of malignant neoplasm of digestive organs: Secondary | ICD-10-CM

## 2017-10-29 DIAGNOSIS — Z86010 Personal history of colonic polyps: Secondary | ICD-10-CM

## 2017-10-29 MED ORDER — NA SULFATE-K SULFATE-MG SULF 17.5-3.13-1.6 GM/177ML PO SOLN: 1.0000 | Freq: Once | ORAL | 0 refills | Status: AC

## 2017-10-29 NOTE — Progress Notes (Signed)
Per pt, no allergies to soy or egg products.Pt not taking any weight loss meds or using  O2 at home.  Pt refused emmi video. 

## 2017-10-30 ENCOUNTER — Encounter: Payer: Self-pay | Admitting: Gastroenterology

## 2017-11-04 ENCOUNTER — Encounter: Payer: Self-pay | Admitting: Physician Assistant

## 2017-11-04 ENCOUNTER — Other Ambulatory Visit: Payer: Self-pay

## 2017-11-04 ENCOUNTER — Ambulatory Visit (INDEPENDENT_AMBULATORY_CARE_PROVIDER_SITE_OTHER): Payer: Medicare HMO | Admitting: Physician Assistant

## 2017-11-04 DIAGNOSIS — I10 Essential (primary) hypertension: Secondary | ICD-10-CM

## 2017-11-04 MED ORDER — CARVEDILOL 12.5 MG PO TABS: 12.5000 mg | ORAL_TABLET | Freq: Two times a day (BID) | ORAL | 3 refills | Status: DC

## 2017-11-04 NOTE — Patient Instructions (Signed)
Blood pressure looks great today! Continue current medication regimen and keep a well-balanced diet.  Follow-up with me in 6 months for your physical and medicare wellness. Return sooner if needed.   Hypertension Hypertension is another name for high blood pressure. High blood pressure forces your heart to work harder to pump blood. This can cause problems over time. There are two numbers in a blood pressure reading. There is a top number (systolic) over a bottom number (diastolic). It is best to have a blood pressure below 120/80. Healthy choices can help lower your blood pressure. You may need medicine to help lower your blood pressure if:  Your blood pressure cannot be lowered with healthy choices.  Your blood pressure is higher than 130/80.  Follow these instructions at home: Eating and drinking  If directed, follow the DASH eating plan. This diet includes: ? Filling half of your plate at each meal with fruits and vegetables. ? Filling one quarter of your plate at each meal with whole grains. Whole grains include whole wheat pasta, brown rice, and whole grain bread. ? Eating or drinking low-fat dairy products, such as skim milk or low-fat yogurt. ? Filling one quarter of your plate at each meal with low-fat (lean) proteins. Low-fat proteins include fish, skinless chicken, eggs, beans, and tofu. ? Avoiding fatty meat, cured and processed meat, or chicken with skin. ? Avoiding premade or processed food.  Eat less than 1,500 mg of salt (sodium) a day.  Limit alcohol use to no more than 1 drink a day for nonpregnant women and 2 drinks a day for men. One drink equals 12 oz of beer, 5 oz of wine, or 1 oz of hard liquor. Lifestyle  Work with your doctor to stay at a healthy weight or to lose weight. Ask your doctor what the best weight is for you.  Get at least 30 minutes of exercise that causes your heart to beat faster (aerobic exercise) most days of the week. This may include  walking, swimming, or biking.  Get at least 30 minutes of exercise that strengthens your muscles (resistance exercise) at least 3 days a week. This may include lifting weights or pilates.  Do not use any products that contain nicotine or tobacco. This includes cigarettes and e-cigarettes. If you need help quitting, ask your doctor.  Check your blood pressure at home as told by your doctor.  Keep all follow-up visits as told by your doctor. This is important. Medicines  Take over-the-counter and prescription medicines only as told by your doctor. Follow directions carefully.  Do not skip doses of blood pressure medicine. The medicine does not work as well if you skip doses. Skipping doses also puts you at risk for problems.  Ask your doctor about side effects or reactions to medicines that you should watch for. Contact a doctor if:  You think you are having a reaction to the medicine you are taking.  You have headaches that keep coming back (recurring).  You feel dizzy.  You have swelling in your ankles.  You have trouble with your vision. Get help right away if:  You get a very bad headache.  You start to feel confused.  You feel weak or numb.  You feel faint.  You get very bad pain in your: ? Chest. ? Belly (abdomen).  You throw up (vomit) more than once.  You have trouble breathing. Summary  Hypertension is another name for high blood pressure.  Making healthy choices can help lower  blood pressure. If your blood pressure cannot be controlled with healthy choices, you may need to take medicine. This information is not intended to replace advice given to you by your health care provider. Make sure you discuss any questions you have with your health care provider. Document Released: 10/03/2007 Document Revised: 03/14/2016 Document Reviewed: 03/14/2016 Elsevier Interactive Patient Education  [employer].

## 2017-11-04 NOTE — Progress Notes (Signed)
Patient presents to clinic today for follow-up of hypertension. Patient is currently on a regimen of Carvedilol 12.5 mg BID, amlodipine 5 mg daily and Hyzaar 100-25 mg daily. Endorses taking as directed. Patient denies chest pain, palpitations, lightheadedness, dizziness, vision changes or frequent headaches.  BP Readings from Last 3 Encounters:  11/04/17 120/80  10/28/17 132/82  05/06/17 130/82   Past Medical History:  Diagnosis Date  . Adenomatous colon polyp 2003  . Allergy    seasonal  . Anxiety   . Diabetes mellitus   . Dyslipidemia   . GERD (gastroesophageal reflux disease)   . Hemorrhoids    hx of  . Hypertension   . Urolithiasis     Current Outpatient Medications on File Prior to Visit  Medication Sig Dispense Refill  . amLODipine (NORVASC) 5 MG tablet TAKE 1 TABLET EVERY DAY 90 tablet 0  . aspirin 81 MG tablet Take 81 mg by mouth daily.      Marland Kitchen losartan-hydrochlorothiazide (HYZAAR) 100-25 MG tablet TAKE 1 TABLET EVERY DAY 90 tablet 1  . metFORMIN (GLUCOPHAGE-XR) 500 MG 24 hr tablet TAKE 2 TABLETS TWICE DAILY 360 tablet 3  . triamcinolone (KENALOG) 0.025 % cream Apply 1 application topically 4 (four) times daily. As needed for rash 30 g 3   No current facility-administered medications on file prior to visit.     No Known Allergies  Family History  Problem Relation Age of Onset  . Cancer Sister        Colon  . Aneurysm Sister        CNS  . Intracerebral hemorrhage Sister   . Colon cancer Sister 25  . Cancer Mother        Breast  . Heart disease Mother        CVA x 2  . Breast cancer Mother     Social History   Socioeconomic History  . Marital status: Married    Spouse name: Not on file  . Number of children: Not on file  . Years of education: Not on file  . Highest education level: Not on file  Occupational History  . Not on file  Social Needs  . Financial resource strain: Not on file  . Food insecurity:    Worry: Not on file    Inability:  Not on file  . Transportation needs:    Medical: Not on file    Non-medical: Not on file  Tobacco Use  . Smoking status: Never Smoker  . Smokeless tobacco: Never Used  Substance and Sexual Activity  . Alcohol use: No    Alcohol/week: 0.0 oz  . Drug use: No  . Sexual activity: Not on file  Lifestyle  . Physical activity:    Days per week: Not on file    Minutes per session: Not on file  . Stress: Not on file  Relationships  . Social connections:    Talks on phone: Not on file    Gets together: Not on file    Attends religious service: Not on file    Active member of club or organization: Not on file    Attends meetings of clubs or organizations: Not on file    Relationship status: Not on file  Other Topics Concern  . Not on file  Social History Narrative  . Not on file   Review of Systems - See HPI.  All other ROS are negative.  BP 120/80   Pulse 92   Temp 98.1 F (  36.7 C) (Oral)   Resp 14   Ht 5\' 2"  (1.575 m)   Wt 222 lb (100.7 kg)   SpO2 96%   BMI 40.60 kg/m   Physical Exam  Constitutional: She appears well-developed and well-nourished.  HENT:  Head: Normocephalic and atraumatic.  Eyes: Pupils are equal, round, and reactive to light.  Neck: Neck supple.  Cardiovascular: Normal rate, regular rhythm, normal heart sounds and intact distal pulses.  Pulmonary/Chest: Effort normal and breath sounds normal. No respiratory distress.  Vitals reviewed.  Recent Results (from the past 2160 hour(s))  POCT HgB A1C     Status: Abnormal   Collection Time: 10/28/17 11:06 AM  Result Value Ref Range   Hemoglobin A1C 6.4 (A) 4.0 - 5.6 %   HbA1c POC (<> result, manual entry)  4.0 - 5.6 %   HbA1c, POC (prediabetic range)  5.7 - 6.4 %   HbA1c, POC (controlled diabetic range)  0.0 - 7.0 %    Assessment/Plan: Essential hypertension BP normotensive. Asymptomatic. Continue current regimen. Medications refilled. Labs stable on last check. Follow-up in 6 months for CPE and  MWV.    Leeanne Rio, PA-C

## 2017-11-04 NOTE — Assessment & Plan Note (Signed)
BP normotensive. Asymptomatic. Continue current regimen. Medications refilled. Labs stable on last check. Follow-up in 6 months for CPE and MWV.

## 2017-11-12 ENCOUNTER — Other Ambulatory Visit: Payer: Self-pay | Admitting: Endocrinology

## 2017-11-13 ENCOUNTER — Ambulatory Visit (AMBULATORY_SURGERY_CENTER): Payer: Medicare HMO | Admitting: Gastroenterology

## 2017-11-13 ENCOUNTER — Encounter: Payer: Self-pay | Admitting: Gastroenterology

## 2017-11-13 VITALS — BP 100/62 | HR 70 | Temp 97.1°F | Resp 16 | Ht 62.0 in | Wt 223.0 lb

## 2017-11-13 DIAGNOSIS — Z86010 Personal history of colonic polyps: Secondary | ICD-10-CM | POA: Diagnosis not present

## 2017-11-13 DIAGNOSIS — D123 Benign neoplasm of transverse colon: Secondary | ICD-10-CM

## 2017-11-13 DIAGNOSIS — Z1211 Encounter for screening for malignant neoplasm of colon: Secondary | ICD-10-CM | POA: Diagnosis not present

## 2017-11-13 DIAGNOSIS — Z800 Family history of malignant neoplasm of digestive organs: Secondary | ICD-10-CM

## 2017-11-13 MED ORDER — SODIUM CHLORIDE 0.9 % IV SOLN: 500.0000 mL | Freq: Once | INTRAVENOUS | Status: DC

## 2017-11-13 NOTE — Progress Notes (Signed)
Called to room to assist during endoscopic procedure.  Patient ID and intended procedure confirmed with present staff. Received instructions for my participation in the procedure from the performing physician.  

## 2017-11-13 NOTE — Progress Notes (Signed)
I have reviewed the patient's medical history in detail and updated the computerized patient record.

## 2017-11-13 NOTE — Patient Instructions (Signed)
Handout given on polyps  YOU HAD AN ENDOSCOPIC PROCEDURE TODAY AT [facility]:   Refer to the procedure report that was given to you for any specific questions about what was found during the examination.  If the procedure report does not answer your questions, please call your gastroenterologist to clarify.  If you requested that your care partner not be given the details of your procedure findings, then the procedure report has been included in a sealed envelope for you to review at your convenience later.  YOU SHOULD EXPECT: Some feelings of bloating in the abdomen. Passage of more gas than usual.  Walking can help get rid of the air that was put into your GI tract during the procedure and reduce the bloating. If you had a lower endoscopy (such as a colonoscopy or flexible sigmoidoscopy) you may notice spotting of blood in your stool or on the toilet paper. If you underwent a bowel prep for your procedure, you may not have a normal bowel movement for a few days.  Please Note:  You might notice some irritation and congestion in your nose or some drainage.  This is from the oxygen used during your procedure.  There is no need for concern and it should clear up in a day or so.  SYMPTOMS TO REPORT IMMEDIATELY:   Following lower endoscopy (colonoscopy or flexible sigmoidoscopy):  Excessive amounts of blood in the stool  Significant tenderness or worsening of abdominal pains  Swelling of the abdomen that is new, acute  Fever of 100F or higher    For urgent or emergent issues, a gastroenterologist can be reached at any hour by calling [phone].   DIET:  We do recommend a small meal at first, but then you may proceed to your regular diet.  Drink plenty of fluids but you should avoid alcoholic beverages for 24 hours.  ACTIVITY:  You should plan to take it easy for the rest of today and you should NOT DRIVE or use heavy machinery until tomorrow (because of the sedation  medicines used during the test).    FOLLOW UP: Our staff will call the number listed on your records the next business day following your procedure to check on you and address any questions or concerns that you may have regarding the information given to you following your procedure. If we do not reach you, we will leave a message.  However, if you are feeling well and you are not experiencing any problems, there is no need to return our call.  We will assume that you have returned to your regular daily activities without incident.  If any biopsies were taken you will be contacted by phone or by letter within the next 1-3 weeks.  Please call us at [phone] if you have not heard about the biopsies in 3 weeks.    SIGNATURES/CONFIDENTIALITY: You and/or your care partner have signed paperwork which will be entered into your electronic medical record.  These signatures attest to the fact that that the information above on your After Visit Summary has been reviewed and is understood.  Full responsibility of the confidentiality of this discharge information lies with you and/or your care-partner.

## 2017-11-13 NOTE — Op Note (Signed)
[facility] Patient Name: Jennifer Saunders Procedure Date: 11/13/2017 8:38 AM MRN: [id number] Endoscopist: Ladene Artist , MD Age: 67 Referring MD:  Date of Birth: Feb 18, 1951 Gender: Female Account #: [id number] Procedure:                Colonoscopy Indications:              Surveillance: Personal history of adenomatous                            polyps on last colonoscopy 5 years ago Medicines:                Monitored Anesthesia Care Procedure:                Pre-Anesthesia Assessment:                           - Prior to the procedure, a History and Physical                            was performed, and patient medications and                            allergies were reviewed. The patient's tolerance of                            previous anesthesia was also reviewed. The risks                            and benefits of the procedure and the sedation                            options and risks were discussed with the patient.                            All questions were answered, and informed consent                            was obtained. Prior Anticoagulants: The patient has                            taken no previous anticoagulant or antiplatelet                            agents. ASA Grade Assessment: II - A patient with                            mild systemic disease. After reviewing the risks                            and benefits, the patient was deemed in                            satisfactory condition to undergo the procedure.  After obtaining informed consent, the colonoscope                            was passed under direct vision. Throughout the                            procedure, the patient's blood pressure, pulse, and                            oxygen saturations were monitored continuously. The                            Colonoscope was introduced through the anus and                            advanced to the the  cecum, identified by                            appendiceal orifice and ileocecal valve. The                            ileocecal valve, appendiceal orifice, and rectum                            were photographed. The quality of the bowel                            preparation was good. The colonoscopy was performed                            without difficulty. The patient tolerated the                            procedure well. Scope In: 8:44:15 AM Scope Out: 8:55:54 AM Scope Withdrawal Time: 0 hours 10 minutes 36 seconds  Total Procedure Duration: 0 hours 11 minutes 39 seconds  Findings:                 The perianal and digital rectal examinations were                            normal.                           A 6 mm polyp was found in the transverse colon. The                            polyp was sessile. The polyp was removed with a                            cold snare. Resection and retrieval were complete.                           A 5 mm polyp was found in the transverse colon. The  polyp was sessile. The polyp was removed with a                            cold biopsy forceps. Resection and retrieval were                            complete.                           The exam was otherwise without abnormality on                            direct and retroflexion views. Complications:            No immediate complications. Estimated blood loss:                            None. Estimated Blood Loss:     Estimated blood loss: none. Impression:               - One 6 mm polyp in the transverse colon, removed                            with a cold snare. Resected and retrieved.                           - One 5 mm polyp in the transverse colon, removed                            with a cold biopsy forceps. Resected and retrieved.                           - The examination was otherwise normal on direct                            and retroflexion  views. Recommendation:           - Repeat colonoscopy in 5 years for surveillance.                           - Patient has a contact number available for                            emergencies. The signs and symptoms of potential                            delayed complications were discussed with the                            patient. Return to normal activities tomorrow.                            Written discharge instructions were provided to the  patient.                           - Resume previous diet.                           - Continue present medications.                           - Await pathology results. Ladene Artist, MD 11/13/2017 8:58:36 AM This report has been signed electronically.

## 2017-11-13 NOTE — Progress Notes (Signed)
A and O x3. Report to RN. Tolerated MAC anesthesia well.

## 2017-11-14 ENCOUNTER — Telehealth: Payer: Self-pay

## 2017-11-14 NOTE — Telephone Encounter (Signed)
  Follow up Call-  Call back number 11/13/2017  Post procedure Call Back phone  # [phone]  Permission to leave phone message Yes  Some recent data might be hidden     Patient questions:  Do you have a fever, pain , or abdominal swelling? No. Pain Score  0 *  Have you tolerated food without any problems? Yes.    Have you been able to return to your normal activities? Yes.    Do you have any questions about your discharge instructions: Diet   No. Medications  No. Follow up visit  No.  Do you have questions or concerns about your Care? No.  Actions: * If pain score is 4 or above: No action needed, pain <4.

## 2017-11-21 ENCOUNTER — Encounter: Payer: Self-pay | Admitting: Gastroenterology

## 2018-01-23 ENCOUNTER — Other Ambulatory Visit: Payer: Self-pay | Admitting: Family Medicine

## 2018-01-23 DIAGNOSIS — Z1231 Encounter for screening mammogram for malignant neoplasm of breast: Secondary | ICD-10-CM

## 2018-02-19 ENCOUNTER — Other Ambulatory Visit: Payer: Self-pay | Admitting: Physician Assistant

## 2018-02-25 ENCOUNTER — Ambulatory Visit
Admission: RE | Admit: 2018-02-25 | Discharge: 2018-02-25 | Disposition: A | Discharge status: Home / Self Care | Payer: Medicare HMO | Source: Ambulatory Visit | Attending: Family Medicine | Admitting: Family Medicine

## 2018-02-25 DIAGNOSIS — Z1231 Encounter for screening mammogram for malignant neoplasm of breast: Secondary | ICD-10-CM

## 2018-03-03 DIAGNOSIS — Z23 Encounter for immunization: Secondary | ICD-10-CM | POA: Diagnosis not present

## 2018-03-11 NOTE — Progress Notes (Signed)
Subjective:   Jennifer Saunders is a 67 y.o. female who presents for Medicare Annual (Subsequent) preventive examination.  Review of Systems:  No ROS.  Medicare Wellness Visit. Additional risk factors are reflected in the social history.  Cardiac Risk Factors include: advanced age (>10mn, >>25women);diabetes mellitus;dyslipidemia;hypertension;obesity (BMI >30kg/m2);sedentary lifestyle;family history of premature cardiovascular disease   Sleep patterns: Sleeps 5 hours, naps during day.  Home Safety/Smoke Alarms: Feels safe in home. Smoke alarms in place.  Living environment; residence and Firearm Safety: Lives alone in 1st floor condo.  Seat Belt Safety/Bike Helmet: Wears seat belt.   Female:   Pap-N/A       Mammo-02/25/2018, BI-RADS CATEGORY  1: Negative.       Dexa scan-01/26/2015, Osteopenia. Postpone to complete with mammo.       [facility]S-Colonoscopy 11/13/2017, polyp. Recall 5 years.      Objective:     Vitals: BP 112/68 (BP Location: Left Arm, Patient Position: Sitting, Cuff Size: Normal)   Pulse 76   Ht 5' 2"  (1.575 m)   Wt 223 lb 6 oz (101.3 kg)   SpO2 96%   BMI 40.86 kg/m   Body mass index is 40.86 kg/m.  Advanced Directives 03/12/2018 10/22/2016 04/10/2016 01/17/2015  Does Patient Have a Medical Advance Directive? Yes No No No  Type of Advance Directive Living will - - -    Tobacco Social History   Tobacco Use  Smoking Status Never Smoker  Smokeless Tobacco Never Used     Counseling given: Not Answered    Past Medical History:  Diagnosis Date  . Adenomatous colon polyp 2003  . Allergy    seasonal  . Anxiety   . Diabetes mellitus   . Dyslipidemia   . GERD (gastroesophageal reflux disease)   . Hemorrhoids    hx of  . Hypertension   . Urolithiasis    Past Surgical History:  Procedure Laterality Date  . COLONOSCOPY    . no prior surgery     Family History  Problem Relation Age of Onset  . Cancer Sister        Colon  . Intracerebral hemorrhage  Sister   . Colon cancer Sister 588 . Cancer Mother        Breast  . Heart disease Mother        CVA x 2  . Breast cancer Mother 794 . Diabetes Maternal Aunt   . Diabetes Paternal Aunt   . Aneurysm Sister   . Hypertension Sister   . Heart disease Sister    Social History   Socioeconomic History  . Marital status: Married    Spouse name: Not on file  . Number of children: Not on file  . Years of education: Not on file  . Highest education level: Not on file  Occupational History  . Not on file  Social Needs  . Financial resource strain: Not on file  . Food insecurity:    Worry: Not on file    Inability: Not on file  . Transportation needs:    Medical: Not on file    Non-medical: Not on file  Tobacco Use  . Smoking status: Never Smoker  . Smokeless tobacco: Never Used  Substance and Sexual Activity  . Alcohol use: No    Alcohol/week: 0.0 standard drinks  . Drug use: No  . Sexual activity: Not on file  Lifestyle  . Physical activity:    Days per week: Not on file  Minutes per session: Not on file  . Stress: Not on file  Relationships  . Social connections:    Talks on phone: Not on file    Gets together: Not on file    Attends religious service: Not on file    Active member of club or organization: Not on file    Attends meetings of clubs or organizations: Not on file    Relationship status: Not on file  Other Topics Concern  . Not on file  Social History Narrative  . Not on file    Outpatient Encounter Medications as of 03/12/2018  Medication Sig  . amLODipine (NORVASC) 5 MG tablet TAKE 1 TABLET EVERY DAY  . aspirin 81 MG tablet Take 81 mg by mouth daily.    . carvedilol (COREG) 12.5 MG tablet TAKE 1 TABLET (12.5 MG TOTAL) BY MOUTH 2 (TWO) TIMES DAILY WITH A MEAL.  Marland Kitchen losartan-hydrochlorothiazide (HYZAAR) 100-25 MG tablet TAKE 1 TABLET EVERY DAY  . metFORMIN (GLUCOPHAGE-XR) 500 MG 24 hr tablet TAKE 2 TABLETS TWICE DAILY  . triamcinolone (KENALOG) 0.025 %  cream Apply 1 application topically 4 (four) times daily. As needed for rash  . Zoster Vaccine Adjuvanted [facility]) injection Inject 0.5 mLs into the muscle once for 1 dose.   Facility-Administered Encounter Medications as of 03/12/2018  Medication  . 0.9 %  sodium chloride infusion    Activities of Daily Living In your present state of health, do you have any difficulty performing the following activities: 03/12/2018 11/04/2017  Hearing? N N  Vision? N N  Difficulty concentrating or making decisions? N N  Walking or climbing stairs? N N  Dressing or bathing? N N  Doing errands, shopping? N N  Preparing Food and eating ? N -  Using the Toilet? N -  In the past six months, have you accidently leaked urine? N -  Do you have problems with loss of bowel control? N -  Managing your Medications? N -  Managing your Finances? N -  Housekeeping or managing your Housekeeping? N -  Some recent data might be hidden    Patient Care Team: Delorse Limber as PCP - General (Family Medicine) Renato Shin, MD as Consulting Physician (Endocrinology) Thelma Comp, OD (Optometry)    Assessment:   This is a routine wellness examination for [place].  Exercise Activities and Dietary recommendations Current Exercise Habits: Home exercise routine, Frequency (Times/Week): 2, Exercise limited by: None identified   Diet (meal preparation, eat out, water intake, caffeinated beverages, dairy products, fruits and vegetables): Drinks water and tea. Avoids juices and sodas.   Breakfast: egg, toast, sausage; cereal Lunch: soup; salad toppings Dinner: vegetables  Goals    . Weight (lb) < 215 lb (97.5 kg)     Lose weight decreasing caloric intake.        Fall Risk Fall Risk  03/12/2018 11/04/2017 05/30/2016  Falls in the past year? 0 No No    Depression Screen PHQ 2/9 Scores 03/12/2018 11/04/2017 05/30/2016 05/30/2016  PHQ - 2 Score 0 0 0 0  PHQ- 9 Score - - - 0     Cognitive  Function MMSE - Mini Mental State Exam 03/12/2018  Orientation to time 5  Orientation to Place 5  Registration 3  Attention/ Calculation 5  Recall 0  Language- name 2 objects 2  Language- repeat 1  Language- follow 3 step command 3  Language- read & follow direction 1  Write a sentence 1  Copy design 1  Total score 27        Immunization History  Administered Date(s) Administered  . Influenza, High Dose Seasonal PF 02/25/2014, 03/03/2018  . Influenza,inj,Quad PF,6+ Mos 03/07/2017  . Influenza-Unspecified 01/17/2015, 01/29/2016  . Pneumococcal Conjugate-13 01/15/2014  . Pneumococcal Polysaccharide-23 05/24/2009, 05/30/2016  . Td 08/28/1997  . Tdap 01/15/2014    Screening Tests Health Maintenance  Topic Date Due  . HEMOGLOBIN A1C  04/30/2018  . OPHTHALMOLOGY EXAM  04/30/2018  . FOOT EXAM  10/29/2018  . MAMMOGRAM  02/26/2020  . COLONOSCOPY  11/14/2022  . DTaP/Tdap/Td (2 - Td) 01/16/2024  . TETANUS/TDAP  01/16/2024  . INFLUENZA VACCINE  Completed  . DEXA SCAN  Completed  . Hepatitis C Screening  Completed  . PNA vac Low Risk Adult  Completed        Plan:      Shingles vaccine at pharmacy.   Bring a copy of your living will and/or healthcare power of attorney to your next office visit.  Continue doing brain stimulating activities (puzzles, reading, adult coloring books, staying active) to keep memory sharp.     I have personally reviewed and noted the following in the patient's chart:   . Medical and social history . Use of alcohol, tobacco or illicit drugs  . Current medications and supplements . Functional ability and status . Nutritional status . Physical activity . Advanced directives . List of other physicians . Hospitalizations, surgeries, and ER visits in previous 12 months . Vitals . Screenings to include cognitive, depression, and falls . Referrals and appointments  In addition, I have reviewed and discussed with patient certain preventive  protocols, quality metrics, and best practice recommendations. A written personalized care plan for preventive services as well as general preventive health recommendations were provided to patient.     Gerilyn Nestle, RN  03/12/2018   F/U with PCP 04/2018

## 2018-03-12 ENCOUNTER — Other Ambulatory Visit: Payer: Self-pay

## 2018-03-12 ENCOUNTER — Encounter: Payer: Self-pay | Admitting: Physician Assistant

## 2018-03-12 ENCOUNTER — Ambulatory Visit (INDEPENDENT_AMBULATORY_CARE_PROVIDER_SITE_OTHER): Payer: Medicare HMO

## 2018-03-12 VITALS — BP 112/68 | HR 76 | Ht 62.0 in | Wt 223.4 lb

## 2018-03-12 DIAGNOSIS — E669 Obesity, unspecified: Secondary | ICD-10-CM

## 2018-03-12 DIAGNOSIS — Z23 Encounter for immunization: Secondary | ICD-10-CM

## 2018-03-12 DIAGNOSIS — Z0000 Encounter for general adult medical examination without abnormal findings: Secondary | ICD-10-CM

## 2018-03-12 MED ORDER — ZOSTER VAC RECOMB ADJUVANTED 50 MCG/0.5ML IM SUSR: 0.5000 mL | Freq: Once | INTRAMUSCULAR | 1 refills | Status: AC

## 2018-03-12 NOTE — Progress Notes (Signed)
RN MWV note reviewed.  Johanne Mcglade Cody Caitlan Chauca, PA-C  

## 2018-03-12 NOTE — Patient Instructions (Addendum)
Shingles vaccine at pharmacy.   Bring a copy of your living will and/or healthcare power of attorney to your next office visit.  Continue doing brain stimulating activities (puzzles, reading, adult coloring books, staying active) to keep memory sharp.   Health Maintenance, Female Adopting a healthy lifestyle and getting preventive care can go a long way to promote health and wellness. Talk with your health care provider about what schedule of regular examinations is right for you. This is a good chance for you to check in with your provider about disease prevention and staying healthy. In between checkups, there are plenty of things you can do on your own. Experts have done a lot of research about which lifestyle changes and preventive measures are most likely to keep you healthy. Ask your health care provider for more information. Weight and diet Eat a healthy diet  Be sure to include plenty of vegetables, fruits, low-fat dairy products, and lean protein.  Do not eat a lot of foods high in solid fats, added sugars, or salt.  Get regular exercise. This is one of the most important things you can do for your health. ? Most adults should exercise for at least 150 minutes each week. The exercise should increase your heart rate and make you sweat (moderate-intensity exercise). ? Most adults should also do strengthening exercises at least twice a week. This is in addition to the moderate-intensity exercise.  Maintain a healthy weight  Body mass index (BMI) is a measurement that can be used to identify possible weight problems. It estimates body fat based on height and weight. Your health care provider can help determine your BMI and help you achieve or maintain a healthy weight.  For females 67 years of age and older: ? A BMI below 18.5 is considered underweight. ? A BMI of 18.5 to 24.9 is normal. ? A BMI of 25 to 29.9 is considered overweight. ? A BMI of 30 and above is considered  obese.  Watch levels of cholesterol and blood lipids  You should start having your blood tested for lipids and cholesterol at 69 years of age, then have this test every 5 years.  You may need to have your cholesterol levels checked more often if: ? Your lipid or cholesterol levels are high. ? You are older than 69 years of age. ? You are at high risk for heart disease.  Cancer screening Lung Cancer  Lung cancer screening is recommended for adults 45-23 years old who are at high risk for lung cancer because of a history of smoking.  A yearly low-dose CT scan of the lungs is recommended for people who: ? Currently smoke. ? Have quit within the past 15 years. ? Have at least a 30-pack-year history of smoking. A pack year is smoking an average of one pack of cigarettes a day for 1 year.  Yearly screening should continue until it has been 15 years since you quit.  Yearly screening should stop if you develop a health problem that would prevent you from having lung cancer treatment.  Breast Cancer  Practice breast self-awareness. This means understanding how your breasts normally appear and feel.  It also means doing regular breast self-exams. Let your health care provider know about any changes, no matter how small.  If you are in your 20s or 30s, you should have a clinical breast exam (CBE) by a health care provider every 1-3 years as part of a regular health exam.  If you are 40  or older, have a CBE every year. Also consider having a breast X-ray (mammogram) every year.  If you have a family history of breast cancer, talk to your health care provider about genetic screening.  If you are at high risk for breast cancer, talk to your health care provider about having an MRI and a mammogram every year.  Breast cancer gene (BRCA) assessment is recommended for women who have family members with BRCA-related cancers. BRCA-related cancers  include: ? Breast. ? Ovarian. ? Tubal. ? Peritoneal cancers.  Results of the assessment will determine the need for genetic counseling and BRCA1 and BRCA2 testing.  Cervical Cancer Your health care provider may recommend that you be screened regularly for cancer of the pelvic organs (ovaries, uterus, and vagina). This screening involves a pelvic examination, including checking for microscopic changes to the surface of your cervix (Pap test). You may be encouraged to have this screening done every 3 years, beginning at age 21.  For women ages 30-65, health care providers may recommend pelvic exams and Pap testing every 3 years, or they may recommend the Pap and pelvic exam, combined with testing for human papilloma virus (HPV), every 5 years. Some types of HPV increase your risk of cervical cancer. Testing for HPV may also be done on women of any age with unclear Pap test results.  Other health care providers may not recommend any screening for nonpregnant women who are considered low risk for pelvic cancer and who do not have symptoms. Ask your health care provider if a screening pelvic exam is right for you.  If you have had past treatment for cervical cancer or a condition that could lead to cancer, you need Pap tests and screening for cancer for at least 20 years after your treatment. If Pap tests have been discontinued, your risk factors (such as having a new sexual partner) need to be reassessed to determine if screening should resume. Some women have medical problems that increase the chance of getting cervical cancer. In these cases, your health care provider may recommend more frequent screening and Pap tests.  Colorectal Cancer  This type of cancer can be detected and often prevented.  Routine colorectal cancer screening usually begins at 69 years of age and continues through 69 years of age.  Your health care provider may recommend screening at an earlier age if you have risk factors  for colon cancer.  Your health care provider may also recommend using home test kits to check for hidden blood in the stool.  A small camera at the end of a tube can be used to examine your colon directly (sigmoidoscopy or colonoscopy). This is done to check for the earliest forms of colorectal cancer.  Routine screening usually begins at age 50.  Direct examination of the colon should be repeated every 5-10 years through 69 years of age. However, you may need to be screened more often if early forms of precancerous polyps or small growths are found.  Skin Cancer  Check your skin from head to toe regularly.  Tell your health care provider about any new moles or changes in moles, especially if there is a change in a mole's shape or color.  Also tell your health care provider if you have a mole that is larger than the size of a pencil eraser.  Always use sunscreen. Apply sunscreen liberally and repeatedly throughout the day.  Protect yourself by wearing long sleeves, pants, a wide-brimmed hat, and sunglasses whenever you are   outside.  Heart disease, diabetes, and high blood pressure  High blood pressure causes heart disease and increases the risk of stroke. High blood pressure is more likely to develop in: ? People who have blood pressure in the high end of the normal range (130-139/85-89 mm Hg). ? People who are overweight or obese. ? People who are African American.  If you are 87-36 years of age, have your blood pressure checked every 3-5 years. If you are 52 years of age or older, have your blood pressure checked every year. You should have your blood pressure measured twice-once when you are at a hospital or clinic, and once when you are not at a hospital or clinic. Record the average of the two measurements. To check your blood pressure when you are not at a hospital or clinic, you can use: ? An automated blood pressure machine at a pharmacy. ? A home blood pressure monitor.  If  you are between 28 years and 36 years old, ask your health care provider if you should take aspirin to prevent strokes.  Have regular diabetes screenings. This involves taking a blood sample to check your fasting blood sugar level. ? If you are at a normal weight and have a low risk for diabetes, have this test once every three years after 69 years of age. ? If you are overweight and have a high risk for diabetes, consider being tested at a younger age or more often. Preventing infection Hepatitis B  If you have a higher risk for hepatitis B, you should be screened for this virus. You are considered at high risk for hepatitis B if: ? You were born in a country where hepatitis B is common. Ask your health care provider which countries are considered high risk. ? Your parents were born in a high-risk country, and you have not been immunized against hepatitis B (hepatitis B vaccine). ? You have HIV or AIDS. ? You use needles to inject street drugs. ? You live with someone who has hepatitis B. ? You have had sex with someone who has hepatitis B. ? You get hemodialysis treatment. ? You take certain medicines for conditions, including cancer, organ transplantation, and autoimmune conditions.  Hepatitis C  Blood testing is recommended for: ? Everyone born from [birth year] through 1967. ? Anyone with known risk factors for hepatitis C.  Sexually transmitted infections (STIs)  You should be screened for sexually transmitted infections (STIs) including gonorrhea and chlamydia if: ? You are sexually active and are younger than 69 years of age. ? You are older than 69 years of age and your health care provider tells you that you are at risk for this type of infection. ? Your sexual activity has changed since you were last screened and you are at an increased risk for chlamydia or gonorrhea. Ask your health care provider if you are at risk.  If you do not have HIV, but are at risk, it may be recommended  that you take a prescription medicine daily to prevent HIV infection. This is called pre-exposure prophylaxis (PrEP). You are considered at risk if: ? You are sexually active and do not regularly use condoms or know the HIV status of your partner(s). ? You take drugs by injection. ? You are sexually active with a partner who has HIV.  Talk with your health care provider about whether you are at high risk of being infected with HIV. If you choose to begin PrEP, you should first be tested  for HIV. You should then be tested every 3 months for as long as you are taking PrEP. Pregnancy  If you are premenopausal and you may become pregnant, ask your health care provider about preconception counseling.  If you may become pregnant, take 400 to 800 micrograms (mcg) of folic acid every day.  If you want to prevent pregnancy, talk to your health care provider about birth control (contraception). Osteoporosis and menopause  Osteoporosis is a disease in which the bones lose minerals and strength with aging. This can result in serious bone fractures. Your risk for osteoporosis can be identified using a bone density scan.  If you are 45 years of age or older, or if you are at risk for osteoporosis and fractures, ask your health care provider if you should be screened.  Ask your health care provider whether you should take a calcium or vitamin D supplement to lower your risk for osteoporosis.  Menopause may have certain physical symptoms and risks.  Hormone replacement therapy may reduce some of these symptoms and risks. Talk to your health care provider about whether hormone replacement therapy is right for you. Follow these instructions at home:  Schedule regular health, dental, and eye exams.  Stay current with your immunizations.  Do not use any tobacco products including cigarettes, chewing tobacco, or electronic cigarettes.  If you are pregnant, do not drink alcohol.  If you are  breastfeeding, limit how much and how often you drink alcohol.  Limit alcohol intake to no more than 1 drink per day for nonpregnant women. One drink equals 12 ounces of beer, 5 ounces of wine, or 1 ounces of hard liquor.  Do not use street drugs.  Do not share needles.  Ask your health care provider for help if you need support or information about quitting drugs.  Tell your health care provider if you often feel depressed.  Tell your health care provider if you have ever been abused or do not feel safe at home. This information is not intended to replace advice given to you by your health care provider. Make sure you discuss any questions you have with your health care provider. Document Released: 10/30/2010 Document Revised: 09/22/2015 Document Reviewed: 01/18/2015 Elsevier Interactive Patient Education  [employer].

## 2018-03-20 DIAGNOSIS — H00014 Hordeolum externum left upper eyelid: Secondary | ICD-10-CM | POA: Diagnosis not present

## 2018-05-06 ENCOUNTER — Ambulatory Visit (INDEPENDENT_AMBULATORY_CARE_PROVIDER_SITE_OTHER): Payer: Medicare HMO | Admitting: Physician Assistant

## 2018-05-06 ENCOUNTER — Other Ambulatory Visit: Payer: Self-pay

## 2018-05-06 ENCOUNTER — Ambulatory Visit: Payer: Medicare HMO | Admitting: Endocrinology

## 2018-05-06 ENCOUNTER — Encounter: Payer: Self-pay | Admitting: Physician Assistant

## 2018-05-06 VITALS — BP 120/82 | HR 87 | Temp 98.2°F | Resp 14 | Ht 62.0 in | Wt 223.0 lb

## 2018-05-06 DIAGNOSIS — E119 Type 2 diabetes mellitus without complications: Secondary | ICD-10-CM

## 2018-05-06 DIAGNOSIS — I10 Essential (primary) hypertension: Secondary | ICD-10-CM | POA: Diagnosis not present

## 2018-05-06 DIAGNOSIS — Z0000 Encounter for general adult medical examination without abnormal findings: Secondary | ICD-10-CM | POA: Diagnosis not present

## 2018-05-06 DIAGNOSIS — K7581 Nonalcoholic steatohepatitis (NASH): Secondary | ICD-10-CM | POA: Diagnosis not present

## 2018-05-06 LAB — MICROALBUMIN / CREATININE URINE RATIO
CREATININE, U: 96.4 mg/dL
MICROALB/CREAT RATIO: 0.7 mg/g (ref 0.0–30.0)

## 2018-05-06 LAB — COMPREHENSIVE METABOLIC PANEL
ALBUMIN: 4 g/dL (ref 3.5–5.2)
ALT: 41 U/L — ABNORMAL HIGH (ref 0–35)
AST: 32 U/L (ref 0–37)
Alkaline Phosphatase: 79 U/L (ref 39–117)
BUN: 14 mg/dL (ref 6–23)
CHLORIDE: 101 meq/L (ref 96–112)
CO2: 31 meq/L (ref 19–32)
Calcium: 10.4 mg/dL (ref 8.4–10.5)
Creatinine, Ser: 0.89 mg/dL (ref 0.40–1.20)
GFR: 80.76 mL/min (ref >60.00)
Glucose, Bld: 109 mg/dL — ABNORMAL HIGH (ref 70–99)
POTASSIUM: 4.1 meq/L (ref 3.5–5.1)
SODIUM: 138 meq/L (ref 135–145)
Total Bilirubin: 0.3 mg/dL (ref 0.2–1.2)
Total Protein: 7 g/dL (ref 6.0–8.3)

## 2018-05-06 LAB — CBC WITH DIFFERENTIAL/PLATELET
BASOS PCT: 0.5 % (ref 0.0–3.0)
Basophils Absolute: 0 10*3/uL (ref 0.0–0.1)
EOS PCT: 2.4 % (ref 0.0–5.0)
Eosinophils Absolute: 0.2 10*3/uL (ref 0.0–0.7)
HCT: 42.8 % (ref 36.0–46.0)
HEMOGLOBIN: 14.2 g/dL (ref 12.0–15.0)
LYMPHS ABS: 3 10*3/uL (ref 0.7–4.0)
Lymphocytes Relative: 30.3 % (ref 12.0–46.0)
MCHC: 33.1 g/dL (ref 30.0–36.0)
MCV: 91.1 fl (ref 78.0–100.0)
MONO ABS: 0.6 10*3/uL (ref 0.1–1.0)
Monocytes Relative: 5.6 % (ref 3.0–12.0)
NEUTROS ABS: 6.1 10*3/uL (ref 1.4–7.7)
NEUTROS PCT: 61.2 % (ref 43.0–77.0)
PLATELETS: 300 10*3/uL (ref 150.0–400.0)
RBC: 4.7 Mil/uL (ref 3.87–5.11)
RDW: 13.4 % (ref 11.5–15.5)
WBC: 9.9 10*3/uL (ref 4.0–10.5)

## 2018-05-06 LAB — LIPID PANEL
CHOLESTEROL: 110 mg/dL (ref 0–200)
HDL: 37.5 mg/dL — ABNORMAL LOW (ref >39.00)
LDL CALC: 55 mg/dL (ref 0–99)
NonHDL: 72.03
Total CHOL/HDL Ratio: 3
Triglycerides: 83 mg/dL (ref 0.0–149.0)
VLDL: 16.6 mg/dL (ref 0.0–40.0)

## 2018-05-06 LAB — TSH: TSH: 2.73 u[IU]/mL (ref 0.35–4.50)

## 2018-05-06 LAB — HEMOGLOBIN A1C: HEMOGLOBIN A1C: 6.5 % (ref 4.6–6.5)

## 2018-05-06 NOTE — Patient Instructions (Signed)
Please go to the lab for blood work.   Our office will call you with your results unless you have chosen to receive results via MyChart.  If your blood work is normal we will follow-up each year for physicals and as scheduled for chronic medical problems.  If anything is abnormal we will treat accordingly and get you in for a follow-up.   Preventive Care 70 Years and Older, Female Preventive care refers to lifestyle choices and visits with your health care provider that can promote health and wellness. What does preventive care include?  A yearly physical exam. This is also called an annual well check.  Dental exams once or twice a year.  Routine eye exams. Ask your health care provider how often you should have your eyes checked.  Personal lifestyle choices, including: ? Daily care of your teeth and gums. ? Regular physical activity. ? Eating a healthy diet. ? Avoiding tobacco and drug use. ? Limiting alcohol use. ? Practicing safe sex. ? Taking low-dose aspirin every day. ? Taking vitamin and mineral supplements as recommended by your health care provider. What happens during an annual well check? The services and screenings done by your health care provider during your annual well check will depend on your age, overall health, lifestyle risk factors, and family history of disease. Counseling Your health care provider may ask you questions about your:  Alcohol use.  Tobacco use.  Drug use.  Emotional well-being.  Home and relationship well-being.  Sexual activity.  Eating habits.  History of falls.  Memory and ability to understand (cognition).  Work and work environment.  Reproductive health.  Screening You may have the following tests or measurements:  Height, weight, and BMI.  Blood pressure.  Lipid and cholesterol levels. These may be checked every 5 years, or more frequently if you are over 50 years old.  Skin check.  Lung cancer screening. You  may have this screening every year starting at age 70 if you have a 30-pack-year history of smoking and currently smoke or have quit within the past 15 years.  Colorectal cancer screening. All adults should have this screening starting at age 70 and continuing until age 75. You will have tests every 1-10 years, depending on your results and the type of screening test. People at increased risk should start screening at an earlier age. Screening tests may include: ? Guaiac-based fecal occult blood testing. ? Fecal immunochemical test (FIT). ? Stool DNA test. ? Virtual colonoscopy. ? Sigmoidoscopy. During this test, a flexible tube with a tiny camera (sigmoidoscope) is used to examine your rectum and lower colon. The sigmoidoscope is inserted through your anus into your rectum and lower colon. ? Colonoscopy. During this test, a long, thin, flexible tube with a tiny camera (colonoscope) is used to examine your entire colon and rectum.  Hepatitis C blood test.  Hepatitis B blood test.  Sexually transmitted disease (STD) testing.  Diabetes screening. This is done by checking your blood sugar (glucose) after you have not eaten for a while (fasting). You may have this done every 1-3 years.  Bone density scan. This is done to screen for osteoporosis. You may have this done starting at age 70.  Mammogram. This may be done every 1-2 years. Talk to your health care provider about how often you should have regular mammograms. Talk with your health care provider about your test results, treatment options, and if necessary, the need for more tests. Vaccines Your health care provider may   recommend certain vaccines, such as:  Influenza vaccine. This is recommended every year.  Tetanus, diphtheria, and acellular pertussis (Tdap, Td) vaccine. You may need a Td booster every 10 years.  Varicella vaccine. You may need this if you have not been vaccinated.  Zoster vaccine. You may need this after age  60.  Measles, mumps, and rubella (MMR) vaccine. You may need at least one dose of MMR if you were born in 1957 or later. You may also need a second dose.  Pneumococcal 13-valent conjugate (PCV13) vaccine. One dose is recommended after age 70.  Pneumococcal polysaccharide (PPSV23) vaccine. One dose is recommended after age 70.  Meningococcal vaccine. You may need this if you have certain conditions.  Hepatitis A vaccine. You may need this if you have certain conditions or if you travel or work in places where you may be exposed to hepatitis A.  Hepatitis B vaccine. You may need this if you have certain conditions or if you travel or work in places where you may be exposed to hepatitis B.  Haemophilus influenzae type b (Hib) vaccine. You may need this if you have certain conditions. Talk to your health care provider about which screenings and vaccines you need and how often you need them. This information is not intended to replace advice given to you by your health care provider. Make sure you discuss any questions you have with your health care provider. Document Released: 05/13/2015 Document Revised: 06/06/2017 Document Reviewed: 02/15/2015 Elsevier Interactive Patient Education  2019 Else[employer] Inc[employer] .       

## 2018-05-06 NOTE — Progress Notes (Signed)
Patient presents to clinic today for annual exam.  Patient is fasting for labs.  Acute Concerns: Denies acute concerns today.  Chronic Issues: Hypertension -- Currently on a regimen of Amlodipine 5 mg QD, Losartan-HCTZ 100-25 mg, Carvedilol 12.5 mg BID mg daily. Endorses taking as directed. Patient denies chest pain, palpitations, lightheadedness, dizziness, vision changes or frequent headaches.  BP Readings from Last 3 Encounters:  05/06/18 120/82  03/12/18 112/68  11/13/17 100/62    Past Medical History:  Diagnosis Date  . Adenomatous colon polyp 2003  . Allergy    seasonal  . Anxiety   . Diabetes mellitus   . Dyslipidemia   . GERD (gastroesophageal reflux disease)   . Hemorrhoids    hx of  . Hypertension   . Urolithiasis     Past Surgical History:  Procedure Laterality Date  . COLONOSCOPY    . no prior surgery      Current Outpatient Medications on File Prior to Visit  Medication Sig Dispense Refill  . amLODipine (NORVASC) 5 MG tablet TAKE 1 TABLET EVERY DAY 90 tablet 0  . aspirin 81 MG tablet Take 81 mg by mouth daily.      . carvedilol (COREG) 12.5 MG tablet TAKE 1 TABLET (12.5 MG TOTAL) BY MOUTH 2 (TWO) TIMES DAILY WITH A MEAL. 180 tablet 3  . losartan-hydrochlorothiazide (HYZAAR) 100-25 MG tablet TAKE 1 TABLET EVERY DAY 90 tablet 1  . metFORMIN (GLUCOPHAGE-XR) 500 MG 24 hr tablet TAKE 2 TABLETS TWICE DAILY 360 tablet 3  . triamcinolone (KENALOG) 0.025 % cream Apply 1 application topically 4 (four) times daily. As needed for rash 30 g 3   No current facility-administered medications on file prior to visit.     No Known Allergies  Family History  Problem Relation Age of Onset  . Cancer Sister        Colon  . Intracerebral hemorrhage Sister   . Colon cancer Sister 23  . Cancer Mother        Breast  . Heart disease Mother        CVA x 2  . Breast cancer Mother 59  . Diabetes Maternal Aunt   . Diabetes Paternal Aunt   . Aneurysm Sister   .  Hypertension Sister   . Heart disease Sister     Social History   Socioeconomic History  . Marital status: Married    Spouse name: Not on file  . Number of children: Not on file  . Years of education: Not on file  . Highest education level: Not on file  Occupational History  . Not on file  Social Needs  . Financial resource strain: Not on file  . Food insecurity:    Worry: Not on file    Inability: Not on file  . Transportation needs:    Medical: Not on file    Non-medical: Not on file  Tobacco Use  . Smoking status: Never Smoker  . Smokeless tobacco: Never Used  Substance and Sexual Activity  . Alcohol use: No    Alcohol/week: 0.0 standard drinks  . Drug use: No  . Sexual activity: Not Currently  Lifestyle  . Physical activity:    Days per week: Not on file    Minutes per session: Not on file  . Stress: Not on file  Relationships  . Social connections:    Talks on phone: Not on file    Gets together: Not on file    Attends religious service:  Not on file    Active member of club or organization: Not on file    Attends meetings of clubs or organizations: Not on file    Relationship status: Not on file  . Intimate partner violence:    Fear of current or ex partner: Not on file    Emotionally abused: Not on file    Physically abused: Not on file    Forced sexual activity: Not on file  Other Topics Concern  . Not on file  Social History Narrative  . Not on file   Review of Systems  Constitutional: Negative for fever and weight loss.  HENT: Negative for ear discharge, ear pain, hearing loss and tinnitus.   Eyes: Positive for discharge (watery -- followed by Ophthalmology. Is on medication that is controlling allergy symptoms). Negative for blurred vision, double vision, photophobia and pain.  Respiratory: Negative for cough and shortness of breath.   Cardiovascular: Negative for chest pain and palpitations.  Gastrointestinal: Negative for abdominal pain, blood in  stool, constipation, diarrhea, heartburn, melena, nausea and vomiting.  Genitourinary: Negative for dysuria, flank pain, frequency, hematuria and urgency.  Musculoskeletal: Negative for falls.  Neurological: Negative for dizziness, loss of consciousness and headaches.  Endo/Heme/Allergies: Negative for environmental allergies.  Psychiatric/Behavioral: Negative for depression, hallucinations, substance abuse and suicidal ideas. The patient is not nervous/anxious and does not have insomnia.    BP 120/82   Pulse 87   Temp 98.2 F (36.8 C) (Oral)   Resp 14   Ht 5\' 2"  (1.575 m)   Wt 223 lb (101.2 kg)   SpO2 94%   BMI 40.79 kg/m   Physical Exam Vitals signs reviewed.  Constitutional:      Appearance: Normal appearance.  HENT:     Head: Normocephalic and atraumatic.     Right Ear: Tympanic membrane, ear canal and external ear normal.     Left Ear: Tympanic membrane, ear canal and external ear normal.     Nose: Nose normal. No mucosal edema.     Mouth/Throat:     Pharynx: Uvula midline. No oropharyngeal exudate or posterior oropharyngeal erythema.  Eyes:     Conjunctiva/sclera: Conjunctivae normal.     Pupils: Pupils are equal, round, and reactive to light.  Neck:     Musculoskeletal: Normal range of motion and neck supple.     Thyroid: No thyromegaly.  Cardiovascular:     Rate and Rhythm: Normal rate and regular rhythm.     Heart sounds: Normal heart sounds.  Pulmonary:     Effort: Pulmonary effort is normal. No respiratory distress.     Breath sounds: Normal breath sounds. No wheezing or rales.  Abdominal:     General: Bowel sounds are normal. There is no distension.     Palpations: Abdomen is soft. There is no mass.     Tenderness: There is no abdominal tenderness. There is no guarding or rebound.  Lymphadenopathy:     Cervical: No cervical adenopathy.  Skin:    General: Skin is warm and dry.     Findings: No rash.  Neurological:     Mental Status: She is alert and  oriented to person, place, and time.     Cranial Nerves: No cranial nerve deficit.    Assessment/Plan: 1. Essential hypertension BP normotensive. Asymptomatic. Continue current medication regimen.  - Hemoglobin A1c  2. NASH (nonalcoholic steatohepatitis) Repeat liver enzymes and lipids today.  3. Type 2 diabetes mellitus without complication, without long-term current use of insulin (  [place] [facility]) - Comprehensive metabolic panel - Lipid panel - Hemoglobin A1c - TSH - Urine Microalbumin w/creat. ratio  4. Visit for preventive health examination Depression screen negative. Health Maintenance reviewed. Preventive schedule discussed and handout given in AVS. Will obtain fasting labs today.  - CBC with Differential/Platelet   Leeanne Rio, PA-C

## 2018-05-15 ENCOUNTER — Encounter: Payer: Self-pay | Admitting: Endocrinology

## 2018-05-15 ENCOUNTER — Ambulatory Visit (INDEPENDENT_AMBULATORY_CARE_PROVIDER_SITE_OTHER): Payer: Medicare HMO | Admitting: Endocrinology

## 2018-05-15 VITALS — BP 142/80 | HR 90 | Ht 62.0 in | Wt 222.8 lb

## 2018-05-15 DIAGNOSIS — E119 Type 2 diabetes mellitus without complications: Secondary | ICD-10-CM | POA: Diagnosis not present

## 2018-05-15 NOTE — Patient Instructions (Addendum)
Your blood pressure is high today.  Please see your primary care provider soon, to have it rechecked Please continue the same metformin.   Please come back for a follow-up appointment in 6 months.    

## 2018-05-15 NOTE — Progress Notes (Signed)
Subjective:    Patient ID: Jennifer Saunders, female    DOB: 1951/04/03, 68 y.o.   MRN: [id number]  HPI Pt returns for f/u of diabetes mellitus: DM type: 2 Dx'ed: 4098 Complications: TIA Therapy: metformin GDM: never DKA: never Severe hypoglycemia: never.  Pancreatitis: never Pancreatic imaging: never Other: she has never been on insulin.   Interval history: pt states she feels well in general.   Past Medical History:  Diagnosis Date  . Adenomatous colon polyp 2003  . Allergy    seasonal  . Anxiety   . Diabetes mellitus   . Dyslipidemia   . GERD (gastroesophageal reflux disease)   . Hemorrhoids    hx of  . Hypertension   . Urolithiasis     Past Surgical History:  Procedure Laterality Date  . COLONOSCOPY    . no prior surgery      Social History   Socioeconomic History  . Marital status: Married    Spouse name: Not on file  . Number of children: Not on file  . Years of education: Not on file  . Highest education level: Not on file  Occupational History  . Not on file  Social Needs  . Financial resource strain: Not on file  . Food insecurity:    Worry: Not on file    Inability: Not on file  . Transportation needs:    Medical: Not on file    Non-medical: Not on file  Tobacco Use  . Smoking status: Never Smoker  . Smokeless tobacco: Never Used  Substance and Sexual Activity  . Alcohol use: No    Alcohol/week: 0.0 standard drinks  . Drug use: No  . Sexual activity: Not Currently  Lifestyle  . Physical activity:    Days per week: Not on file    Minutes per session: Not on file  . Stress: Not on file  Relationships  . Social connections:    Talks on phone: Not on file    Gets together: Not on file    Attends religious service: Not on file    Active member of club or organization: Not on file    Attends meetings of clubs or organizations: Not on file    Relationship status: Not on file  . Intimate partner violence:    Fear of current or ex  partner: Not on file    Emotionally abused: Not on file    Physically abused: Not on file    Forced sexual activity: Not on file  Other Topics Concern  . Not on file  Social History Narrative  . Not on file    Current Outpatient Medications on File Prior to Visit  Medication Sig Dispense Refill  . amLODipine (NORVASC) 5 MG tablet TAKE 1 TABLET EVERY DAY 90 tablet 0  . aspirin 81 MG tablet Take 81 mg by mouth daily.      . carvedilol (COREG) 12.5 MG tablet TAKE 1 TABLET (12.5 MG TOTAL) BY MOUTH 2 (TWO) TIMES DAILY WITH A MEAL. 180 tablet 3  . losartan-hydrochlorothiazide (HYZAAR) 100-25 MG tablet TAKE 1 TABLET EVERY DAY 90 tablet 1  . metFORMIN (GLUCOPHAGE-XR) 500 MG 24 hr tablet TAKE 2 TABLETS TWICE DAILY 360 tablet 3  . triamcinolone (KENALOG) 0.025 % cream Apply 1 application topically 4 (four) times daily. As needed for rash 30 g 3   No current facility-administered medications on file prior to visit.     No Known Allergies  Family History  Problem Relation Age  of Onset  . Cancer Sister        Colon  . Intracerebral hemorrhage Sister   . Colon cancer Sister 28  . Cancer Mother        Breast  . Heart disease Mother        CVA x 2  . Breast cancer Mother 21  . Diabetes Maternal Aunt   . Diabetes Paternal Aunt   . Aneurysm Sister   . Hypertension Sister   . Heart disease Sister     BP (!) 142/80 (BP Location: Left Arm, Patient Position: Sitting, Cuff Size: Large)   Pulse 90   Ht 5\' 2"  (1.575 m)   Wt 222 lb 12.8 oz (101.1 kg)   SpO2 90%   BMI 40.75 kg/m   Review of Systems Denies sob.      Objective:   Physical Exam VITAL SIGNS:  See vs page GENERAL: no distress Pulses: dorsalis pedis intact bilat.   MSK: no deformity of the feet CV: trace bilat leg edema Skin:  no ulcer on the feet.  normal color and temp on the feet.   Neuro: sensation is intact to touch on the feet.    Lab Results  Component Value Date   HGBA1C 6.5 05/06/2018   Lab Results    Component Value Date   CREATININE 0.89 05/06/2018   BUN 14 05/06/2018   NA 138 05/06/2018   K 4.1 05/06/2018   CL 101 05/06/2018   CO2 31 05/06/2018   Lab Results  Component Value Date   MICROALBUR <0.7 05/06/2018       Assessment & Plan:  Type 2 DM, with CVA: well-controlled.  HTN: is noted today.  Edema: this limits rx options.   Patient Instructions  Your blood pressure is high today.  Please see your primary care provider soon, to have it rechecked.  Please continue the same metformin.   Please come back for a follow-up appointment in 6 months.

## 2018-06-02 ENCOUNTER — Other Ambulatory Visit: Payer: Self-pay | Admitting: Physician Assistant

## 2018-09-02 ENCOUNTER — Other Ambulatory Visit: Payer: Self-pay | Admitting: Physician Assistant

## 2018-09-02 ENCOUNTER — Other Ambulatory Visit: Payer: Self-pay | Admitting: Endocrinology

## 2018-11-11 ENCOUNTER — Other Ambulatory Visit: Payer: Self-pay

## 2018-11-13 ENCOUNTER — Other Ambulatory Visit: Payer: Self-pay

## 2018-11-13 ENCOUNTER — Encounter: Payer: Self-pay | Admitting: Endocrinology

## 2018-11-13 ENCOUNTER — Ambulatory Visit: Payer: Medicare HMO | Admitting: Endocrinology

## 2018-11-13 VITALS — BP 140/78 | HR 103 | Ht 62.0 in | Wt 223.0 lb

## 2018-11-13 DIAGNOSIS — E119 Type 2 diabetes mellitus without complications: Secondary | ICD-10-CM

## 2018-11-13 DIAGNOSIS — R609 Edema, unspecified: Secondary | ICD-10-CM

## 2018-11-13 DIAGNOSIS — I10 Essential (primary) hypertension: Secondary | ICD-10-CM | POA: Diagnosis not present

## 2018-11-13 LAB — POCT GLYCOSYLATED HEMOGLOBIN (HGB A1C): Hemoglobin A1C: 6.5 % — AB (ref 4.0–5.6)

## 2018-11-13 NOTE — Patient Instructions (Addendum)
Your blood pressure is high today.  Please see your primary care provider soon, to have it rechecked Please continue the same metformin. I would be happy to see you back here as needed.

## 2018-11-13 NOTE — Progress Notes (Signed)
Subjective:    Patient ID: Jennifer Saunders, female    DOB: 07/03/1950, 68 y.o.   MRN: [id number]  HPI Pt returns for f/u of diabetes mellitus: DM type: 2 Dx'ed: 3235 Complications: TIA Therapy: metformin.  GDM: never DKA: never Severe hypoglycemia: never.  Pancreatitis: never Pancreatic imaging: never Other: she has never been on insulin; she declines weight loss surgery.  Interval history: pt states she feels well in general.  She take metformin as rx'ed.   Past Medical History:  Diagnosis Date  . Adenomatous colon polyp 2003  . Allergy    seasonal  . Anxiety   . Diabetes mellitus   . Dyslipidemia   . GERD (gastroesophageal reflux disease)   . Hemorrhoids    hx of  . Hypertension   . Urolithiasis     Past Surgical History:  Procedure Laterality Date  . COLONOSCOPY    . no prior surgery      Social History   Socioeconomic History  . Marital status: Married    Spouse name: Not on file  . Number of children: Not on file  . Years of education: Not on file  . Highest education level: Not on file  Occupational History  . Not on file  Social Needs  . Financial resource strain: Not on file  . Food insecurity    Worry: Not on file    Inability: Not on file  . Transportation needs    Medical: Not on file    Non-medical: Not on file  Tobacco Use  . Smoking status: Never Smoker  . Smokeless tobacco: Never Used  Substance and Sexual Activity  . Alcohol use: No    Alcohol/week: 0.0 standard drinks  . Drug use: No  . Sexual activity: Not Currently  Lifestyle  . Physical activity    Days per week: Not on file    Minutes per session: Not on file  . Stress: Not on file  Relationships  . Social Herbalist on phone: Not on file    Gets together: Not on file    Attends religious service: Not on file    Active member of club or organization: Not on file    Attends meetings of clubs or organizations: Not on file    Relationship status: Not on file   . Intimate partner violence    Fear of current or ex partner: Not on file    Emotionally abused: Not on file    Physically abused: Not on file    Forced sexual activity: Not on file  Other Topics Concern  . Not on file  Social History Narrative  . Not on file    Current Outpatient Medications on File Prior to Visit  Medication Sig Dispense Refill  . amLODipine (NORVASC) 5 MG tablet TAKE 1 TABLET EVERY DAY 90 tablet 0  . aspirin 81 MG tablet Take 81 mg by mouth daily.      . carvedilol (COREG) 12.5 MG tablet TAKE 1 TABLET (12.5 MG TOTAL) BY MOUTH 2 (TWO) TIMES DAILY WITH A MEAL. 180 tablet 3  . losartan-hydrochlorothiazide (HYZAAR) 100-25 MG tablet TAKE 1 TABLET EVERY DAY 90 tablet 1  . metFORMIN (GLUCOPHAGE-XR) 500 MG 24 hr tablet TAKE 2 TABLETS TWICE DAILY 360 tablet 3  . triamcinolone (KENALOG) 0.025 % cream Apply 1 application topically 4 (four) times daily. As needed for rash 30 g 3   No current facility-administered medications on file prior to visit.  No Known Allergies  Family History  Problem Relation Age of Onset  . Cancer Sister        Colon  . Intracerebral hemorrhage Sister   . Colon cancer Sister 51  . Cancer Mother        Breast  . Heart disease Mother        CVA x 2  . Breast cancer Mother 44  . Diabetes Maternal Aunt   . Diabetes Paternal Aunt   . Aneurysm Sister   . Hypertension Sister   . Heart disease Sister     BP 140/78 (BP Location: Left Arm, Patient Position: Sitting, Cuff Size: Large)   Pulse (!) 103   Ht 5\' 2"  (1.575 m)   Wt 223 lb (101.2 kg)   SpO2 94%   BMI 40.79 kg/m    Review of Systems She has slight ankle swelling, but no sob.      Objective:   Physical Exam VITAL SIGNS:  See vs page GENERAL: no distress Pulses: dorsalis pedis intact bilat.   MSK: no deformity of the feet, except for bilat bunions.   CV: trace bilat leg edema.   Skin:  no ulcer on the feet.  normal color and temp on the feet.   Neuro: sensation is  intact to touch on the feet.     Lab Results  Component Value Date   HGBA1C 6.5 (A) 11/13/2018   Lab Results  Component Value Date   CREATININE 0.89 05/06/2018   BUN 14 05/06/2018   NA 138 05/06/2018   K 4.1 05/06/2018   CL 101 05/06/2018   CO2 31 05/06/2018       Assessment & Plan:  HTN: is noted today Type 2 DM, with TIA: well-controlled Edema: This limits rx options   Patient Instructions  Your blood pressure is high today.  Please see your primary care provider soon, to have it rechecked Please continue the same metformin. I would be happy to see you back here as needed.

## 2018-11-20 DIAGNOSIS — E119 Type 2 diabetes mellitus without complications: Secondary | ICD-10-CM | POA: Diagnosis not present

## 2018-11-20 DIAGNOSIS — H52223 Regular astigmatism, bilateral: Secondary | ICD-10-CM | POA: Diagnosis not present

## 2018-11-20 DIAGNOSIS — H524 Presbyopia: Secondary | ICD-10-CM | POA: Diagnosis not present

## 2018-11-20 DIAGNOSIS — H2513 Age-related nuclear cataract, bilateral: Secondary | ICD-10-CM | POA: Diagnosis not present

## 2018-11-20 LAB — HM DIABETES EYE EXAM

## 2018-12-03 ENCOUNTER — Encounter: Payer: Self-pay | Admitting: Physician Assistant

## 2018-12-08 ENCOUNTER — Other Ambulatory Visit: Payer: Self-pay | Admitting: Endocrinology

## 2018-12-08 NOTE — Telephone Encounter (Signed)
Please forward refill request to pt's new primary care provider.  

## 2018-12-09 NOTE — Telephone Encounter (Signed)
Per Dr. Ellison's request, I am forwarding this refill request. Please review and refill if appropriate.  

## 2018-12-17 ENCOUNTER — Other Ambulatory Visit: Payer: Self-pay | Admitting: Physician Assistant

## 2018-12-18 NOTE — Telephone Encounter (Signed)
Due for 6 month follow up.

## 2018-12-22 ENCOUNTER — Encounter: Payer: Self-pay | Admitting: Emergency Medicine

## 2019-01-22 DIAGNOSIS — H43811 Vitreous degeneration, right eye: Secondary | ICD-10-CM | POA: Diagnosis not present

## 2019-02-11 ENCOUNTER — Other Ambulatory Visit: Payer: Self-pay | Admitting: Endocrinology

## 2019-02-11 ENCOUNTER — Other Ambulatory Visit: Payer: Self-pay | Admitting: Physician Assistant

## 2019-02-11 DIAGNOSIS — Z1231 Encounter for screening mammogram for malignant neoplasm of breast: Secondary | ICD-10-CM

## 2019-02-11 NOTE — Telephone Encounter (Signed)
Please forward refill request to pt's primary care provider.   

## 2019-02-11 NOTE — Telephone Encounter (Signed)
Please advise 

## 2019-02-11 NOTE — Telephone Encounter (Signed)
Per Dr. Ellison's request, I am forwarding you this refill request. Please review and refill if appropriate 

## 2019-02-12 NOTE — Telephone Encounter (Signed)
Forwarded to [place] from previous prescribing provider. Will you please consult with patient to see what all she has been using this for, how often, etc?

## 2019-02-12 NOTE — Telephone Encounter (Signed)
Spoke with patient about the Triamcinolone cream She states she uses it for little sores come up on her body that itches and will scab over. She states the Triamcinolone cream doesn't help much She used Cortisone cream this morning and it did help with the itching.

## 2019-02-20 ENCOUNTER — Other Ambulatory Visit: Payer: Self-pay | Admitting: Endocrinology

## 2019-02-20 NOTE — Telephone Encounter (Signed)
Per Dr. Ellison's request, I am forwarding this refill request. Please review and refill if appropriate.  

## 2019-02-20 NOTE — Telephone Encounter (Signed)
Please advise 

## 2019-02-20 NOTE — Telephone Encounter (Signed)
Please forward refill request to pt's primary care provider.   

## 2019-02-23 DIAGNOSIS — H43811 Vitreous degeneration, right eye: Secondary | ICD-10-CM | POA: Diagnosis not present

## 2019-03-13 ENCOUNTER — Other Ambulatory Visit: Payer: Self-pay | Admitting: Emergency Medicine

## 2019-03-13 DIAGNOSIS — I10 Essential (primary) hypertension: Secondary | ICD-10-CM

## 2019-03-13 MED ORDER — AMLODIPINE BESYLATE 5 MG PO TABS: 5.0000 mg | ORAL_TABLET | Freq: Every day | ORAL | 1 refills | Status: DC

## 2019-03-13 MED ORDER — CARVEDILOL 12.5 MG PO TABS: ORAL_TABLET | ORAL | 1 refills | Status: DC

## 2019-03-17 ENCOUNTER — Encounter: Payer: Self-pay | Admitting: Physician Assistant

## 2019-03-17 ENCOUNTER — Ambulatory Visit (INDEPENDENT_AMBULATORY_CARE_PROVIDER_SITE_OTHER): Payer: Medicare HMO | Admitting: Physician Assistant

## 2019-03-17 ENCOUNTER — Other Ambulatory Visit: Payer: Self-pay

## 2019-03-17 VITALS — BP 138/84 | HR 86 | Temp 97.6°F | Resp 14 | Ht 62.0 in | Wt 221.0 lb

## 2019-03-17 DIAGNOSIS — Z0000 Encounter for general adult medical examination without abnormal findings: Secondary | ICD-10-CM

## 2019-03-17 DIAGNOSIS — L810 Postinflammatory hyperpigmentation: Secondary | ICD-10-CM | POA: Diagnosis not present

## 2019-03-17 DIAGNOSIS — R609 Edema, unspecified: Secondary | ICD-10-CM

## 2019-03-17 LAB — CBC WITH DIFFERENTIAL/PLATELET
Basophils Absolute: 0.1 10*3/uL (ref 0.0–0.1)
Basophils Relative: 0.9 % (ref 0.0–3.0)
Eosinophils Absolute: 0.4 10*3/uL (ref 0.0–0.7)
Eosinophils Relative: 3.3 % (ref 0.0–5.0)
HCT: 42.5 % (ref 36.0–46.0)
Hemoglobin: 13.9 g/dL (ref 12.0–15.0)
Lymphocytes Relative: 32 % (ref 12.0–46.0)
Lymphs Abs: 3.6 10*3/uL (ref 0.7–4.0)
MCHC: 32.6 g/dL (ref 30.0–36.0)
MCV: 91.5 fl (ref 78.0–100.0)
Monocytes Absolute: 0.5 10*3/uL (ref 0.1–1.0)
Monocytes Relative: 4.9 % (ref 3.0–12.0)
Neutro Abs: 6.6 10*3/uL (ref 1.4–7.7)
Neutrophils Relative %: 58.9 % (ref 43.0–77.0)
Platelets: 306 10*3/uL (ref 150.0–400.0)
RBC: 4.64 Mil/uL (ref 3.87–5.11)
RDW: 13.5 % (ref 11.5–15.5)
WBC: 11.1 10*3/uL — ABNORMAL HIGH (ref 4.0–10.5)

## 2019-03-17 LAB — COMPREHENSIVE METABOLIC PANEL
ALT: 32 U/L (ref 0–35)
AST: 28 U/L (ref 0–37)
Albumin: 4.2 g/dL (ref 3.5–5.2)
Alkaline Phosphatase: 74 U/L (ref 39–117)
BUN: 15 mg/dL (ref 6–23)
CO2: 29 mEq/L (ref 19–32)
Calcium: 10.6 mg/dL — ABNORMAL HIGH (ref 8.4–10.5)
Chloride: 100 mEq/L (ref 96–112)
Creatinine, Ser: 0.77 mg/dL (ref 0.40–1.20)
GFR: 89.58 mL/min (ref >60.00)
Glucose, Bld: 103 mg/dL — ABNORMAL HIGH (ref 70–99)
Potassium: 4.2 mEq/L (ref 3.5–5.1)
Sodium: 139 mEq/L (ref 135–145)
Total Bilirubin: 0.5 mg/dL (ref 0.2–1.2)
Total Protein: 6.9 g/dL (ref 6.0–8.3)

## 2019-03-17 LAB — LIPID PANEL
Cholesterol: 116 mg/dL (ref 0–200)
HDL: 34.9 mg/dL — ABNORMAL LOW (ref >39.00)
LDL Cholesterol: 63 mg/dL (ref 0–99)
NonHDL: 81.13
Total CHOL/HDL Ratio: 3
Triglycerides: 90 mg/dL (ref 0.0–149.0)
VLDL: 18 mg/dL (ref 0.0–40.0)

## 2019-03-17 NOTE — Patient Instructions (Signed)
Please go to the lab today for blood work.  I will call you with your results. We will alter treatment regimen(s) if indicated by your results.   Start the new dose of BP medication. This should also help with the intermittent leg edema. Make sure to cut back on those processed deli meats! See dietary recommendations below.  Please come see me if there is a recurrence of the rash or at least take a picture of it and send to your MyChart so we can get a better idea of what we are dealing with.    DASH Eating Plan DASH stands for "Dietary Approaches to Stop Hypertension." The DASH eating plan is a healthy eating plan that has been shown to reduce high blood pressure (hypertension). It may also reduce your risk for type 2 diabetes, heart disease, and stroke. The DASH eating plan may also help with weight loss. What are tips for following this plan?  General guidelines  Avoid eating more than 2,300 mg (milligrams) of salt (sodium) a day. If you have hypertension, you may need to reduce your sodium intake to 1,500 mg a day.  Limit alcohol intake to no more than 1 drink a day for nonpregnant women and 2 drinks a day for men. One drink equals 12 oz of beer, 5 oz of wine, or 1 oz of hard liquor.  Work with your health care provider to maintain a healthy body weight or to lose weight. Ask what an ideal weight is for you.  Get at least 30 minutes of exercise that causes your heart to beat faster (aerobic exercise) most days of the week. Activities may include walking, swimming, or biking.  Work with your health care provider or diet and nutrition specialist (dietitian) to adjust your eating plan to your individual calorie needs. Reading food labels   Check food labels for the amount of sodium per serving. Choose foods with less than 5 percent of the Daily Value of sodium. Generally, foods with less than 300 mg of sodium per serving fit into this eating plan.  To find whole grains, look for the  word "whole" as the first word in the ingredient list. Shopping  Buy products labeled as "low-sodium" or "no salt added."  Buy fresh foods. Avoid canned foods and premade or frozen meals. Cooking  Avoid adding salt when cooking. Use salt-free seasonings or herbs instead of table salt or sea salt. Check with your health care provider or pharmacist before using salt substitutes.  Do not fry foods. Cook foods using healthy methods such as baking, boiling, grilling, and broiling instead.  Cook with heart-healthy oils, such as olive, canola, soybean, or sunflower oil. Meal planning  Eat a balanced diet that includes: ? 5 or more servings of fruits and vegetables each day. At each meal, try to fill half of your plate with fruits and vegetables. ? Up to 6-8 servings of whole grains each day. ? Less than 6 oz of lean meat, poultry, or fish each day. A 3-oz serving of meat is about the same size as a deck of cards. One egg equals 1 oz. ? 2 servings of low-fat dairy each day. ? A serving of nuts, seeds, or beans 5 times each week. ? Heart-healthy fats. Healthy fats called Omega-3 fatty acids are found in foods such as flaxseeds and coldwater fish, like sardines, salmon, and mackerel.  Limit how much you eat of the following: ? Canned or prepackaged foods. ? Food that is high in  trans fat, such as fried foods. ? Food that is high in saturated fat, such as fatty meat. ? Sweets, desserts, sugary drinks, and other foods with added sugar. ? Full-fat dairy products.  Do not salt foods before eating.  Try to eat at least 2 vegetarian meals each week.  Eat more home-cooked food and less restaurant, buffet, and fast food.  When eating at a restaurant, ask that your food be prepared with less salt or no salt, if possible. What foods are recommended? The items listed may not be a complete list. Talk with your dietitian about what dietary choices are best for you. Grains Whole-grain or whole-wheat  bread. Whole-grain or whole-wheat pasta. Brown rice. [place]. Bulgur. Whole-grain and low-sodium cereals. Pita bread. Low-fat, low-sodium crackers. Whole-wheat flour tortillas. Vegetables Fresh or frozen vegetables (raw, steamed, roasted, or grilled). Low-sodium or reduced-sodium tomato and vegetable juice. Low-sodium or reduced-sodium tomato sauce and tomato paste. Low-sodium or reduced-sodium canned vegetables. Fruits All fresh, dried, or frozen fruit. Canned fruit in natural juice (without added sugar). Meat and other protein foods Skinless chicken or [place]. Ground chicken or [place]. Pork with fat trimmed off. Fish and seafood. Egg whites. Dried beans, peas, or lentils. Unsalted nuts, nut butters, and seeds. Unsalted canned beans. Lean cuts of beef with fat trimmed off. Low-sodium, lean deli meat. Dairy Low-fat (1%) or fat-free (skim) milk. Fat-free, low-fat, or reduced-fat cheeses. Nonfat, low-sodium ricotta or cottage cheese. Low-fat or nonfat yogurt. Low-fat, low-sodium cheese. Fats and oils Soft margarine without trans fats. Vegetable oil. Low-fat, reduced-fat, or light mayonnaise and salad dressings (reduced-sodium). Canola, safflower, olive, soybean, and sunflower oils. Avocado. Seasoning and other foods Herbs. Spices. Seasoning mixes without salt. Unsalted popcorn and pretzels. Fat-free sweets. What foods are not recommended? The items listed may not be a complete list. Talk with your dietitian about what dietary choices are best for you. Grains Baked goods made with fat, such as croissants, muffins, or some breads. Dry pasta or rice meal packs. Vegetables Creamed or fried vegetables. Vegetables in a cheese sauce. Regular canned vegetables (not low-sodium or reduced-sodium). Regular canned tomato sauce and paste (not low-sodium or reduced-sodium). Regular tomato and vegetable juice (not low-sodium or reduced-sodium). Angie Fava. Olives. Fruits Canned fruit in a light or heavy  syrup. Fried fruit. Fruit in cream or butter sauce. Meat and other protein foods Fatty cuts of meat. Ribs. Fried meat. Berniece Salines. Sausage. Bologna and other processed lunch meats. Salami. Fatback. Hotdogs. Bratwurst. Salted nuts and seeds. Canned beans with added salt. Canned or smoked fish. Whole eggs or egg yolks. Chicken or [place] with skin. Dairy Whole or 2% milk, cream, and half-and-half. Whole or full-fat cream cheese. Whole-fat or sweetened yogurt. Full-fat cheese. Nondairy creamers. Whipped toppings. Processed cheese and cheese spreads. Fats and oils Butter. Stick margarine. Lard. Shortening. Ghee. Bacon fat. Tropical oils, such as coconut, palm kernel, or palm oil. Seasoning and other foods Salted popcorn and pretzels. Onion salt, garlic salt, seasoned salt, table salt, and sea salt. Worcestershire sauce. Tartar sauce. Barbecue sauce. Teriyaki sauce. Soy sauce, including reduced-sodium. Steak sauce. Canned and packaged gravies. Fish sauce. Oyster sauce. Cocktail sauce. Horseradish that you find on the shelf. Ketchup. Mustard. Meat flavorings and tenderizers. Bouillon cubes. Hot sauce and Tabasco sauce. Premade or packaged marinades. Premade or packaged taco seasonings. Relishes. Regular salad dressings. Where to find more information:  National Heart, Lung, and [place]: https://wilson-eaton.com/  American Heart Association: www.heart.org Summary  The DASH eating plan is a healthy eating plan that  has been shown to reduce high blood pressure (hypertension). It may also reduce your risk for type 2 diabetes, heart disease, and stroke.  With the DASH eating plan, you should limit salt (sodium) intake to 2,300 mg a day. If you have hypertension, you may need to reduce your sodium intake to 1,500 mg a day.  When on the DASH eating plan, aim to eat more fresh fruits and vegetables, whole grains, lean proteins, low-fat dairy, and heart-healthy fats.  Work with your health care provider or diet and  nutrition specialist (dietitian) to adjust your eating plan to your individual calorie needs. This information is not intended to replace advice given to you by your health care provider. Make sure you discuss any questions you have with your health care provider. Document Released: 04/05/2011 Document Revised: 03/29/2017 Document Reviewed: 04/09/2016 Elsevier Patient Education  2020 [place] 65 Years and Older, Female Preventive care refers to lifestyle choices and visits with your health care provider that can promote health and wellness. This includes:  A yearly physical exam. This is also called an annual well check.  Regular dental and eye exams.  Immunizations.  Screening for certain conditions.  Healthy lifestyle choices, such as diet and exercise. What can I expect for my preventive care visit? Physical exam Your health care provider will check:  Height and weight. These may be used to calculate body mass index (BMI), which is a measurement that tells if you are at a healthy weight.  Heart rate and blood pressure.  Your skin for abnormal spots. Counseling Your health care provider may ask you questions about:  Alcohol, tobacco, and drug use.  Emotional well-being.  Home and relationship well-being.  Sexual activity.  Eating habits.  History of falls.  Memory and ability to understand (cognition).  Work and work Statistician.  Pregnancy and menstrual history. What immunizations do I need?  Influenza (flu) vaccine  This is recommended every year. Tetanus, diphtheria, and pertussis (Tdap) vaccine  You may need a Td booster every 10 years. Varicella (chickenpox) vaccine  You may need this vaccine if you have not already been vaccinated. Zoster (shingles) vaccine  You may need this after age 93. Pneumococcal conjugate (PCV13) vaccine  One dose is recommended after age 37. Pneumococcal polysaccharide (PPSV23) vaccine  One dose  is recommended after age 9. Measles, mumps, and rubella (MMR) vaccine  You may need at least one dose of MMR if you were born in 1957 or later. You may also need a second dose. Meningococcal conjugate (MenACWY) vaccine  You may need this if you have certain conditions. Hepatitis A vaccine  You may need this if you have certain conditions or if you travel or work in places where you may be exposed to hepatitis A. Hepatitis B vaccine  You may need this if you have certain conditions or if you travel or work in places where you may be exposed to hepatitis B. Haemophilus influenzae type b (Hib) vaccine  You may need this if you have certain conditions. You may receive vaccines as individual doses or as more than one vaccine together in one shot (combination vaccines). Talk with your health care provider about the risks and benefits of combination vaccines. What tests do I need? Blood tests  Lipid and cholesterol levels. These may be checked every 5 years, or more frequently depending on your overall health.  Hepatitis C test.  Hepatitis B test. Screening  Lung cancer screening. You may have  this screening every year starting at age 45 if you have a 30-pack-year history of smoking and currently smoke or have quit within the past 15 years.  Colorectal cancer screening. All adults should have this screening starting at age 39 and continuing until age 34. Your health care provider may recommend screening at age 80 if you are at increased risk. You will have tests every 1-10 years, depending on your results and the type of screening test.  Diabetes screening. This is done by checking your blood sugar (glucose) after you have not eaten for a while (fasting). You may have this done every 1-3 years.  Mammogram. This may be done every 1-2 years. Talk with your health care provider about how often you should have regular mammograms.  BRCA-related cancer screening. This may be done if you have a  family history of breast, ovarian, tubal, or peritoneal cancers. Other tests  Sexually transmitted disease (STD) testing.  Bone density scan. This is done to screen for osteoporosis. You may have this done starting at age 6. Follow these instructions at home: Eating and drinking  Eat a diet that includes fresh fruits and vegetables, whole grains, lean protein, and low-fat dairy products. Limit your intake of foods with high amounts of sugar, saturated fats, and salt.  Take vitamin and mineral supplements as recommended by your health care provider.  Do not drink alcohol if your health care provider tells you not to drink.  If you drink alcohol: ? Limit how much you have to 0-1 drink a day. ? Be aware of how much alcohol is in your drink. In the [place] one drink equals one 12 oz bottle of beer (355 mL), one 5 oz glass of wine (148 mL), or one 1 oz glass of hard liquor (44 mL). Lifestyle  Take daily care of your teeth and gums.  Stay active. Exercise for at least 30 minutes on 5 or more days each week.  Do not use any products that contain nicotine or tobacco, such as cigarettes, e-cigarettes, and chewing tobacco. If you need help quitting, ask your health care provider.  If you are sexually active, practice safe sex. Use a condom or other form of protection in order to prevent STIs (sexually transmitted infections).  Talk with your health care provider about taking a low-dose aspirin or statin. What's next?  Go to your health care provider once a year for a well check visit.  Ask your health care provider how often you should have your eyes and teeth checked.  Stay up to date on all vaccines. This information is not intended to replace advice given to you by your health care provider. Make sure you discuss any questions you have with your health care provider. Document Released: 05/13/2015 Document Revised: 04/10/2018 Document Reviewed: 04/10/2018 Elsevier Patient Education   2020 [employer].

## 2019-03-17 NOTE — Progress Notes (Signed)
Subjective:   Jennifer Saunders is a 68 y.o. female who presents for Medicare Annual (Subsequent) preventive examination.  Review of Systems:  Recurrent rash on right shin, initially erythematous and pruritic, will scab over into hyperpigmented spot. Has tried triamcinolone and cortisone cream with minimal relief. Notes history of similar spots on her arms and back.  Left leg swelling-- notes after prolonged sitting, denies pain, redness, numbness, or tingling. Eats processed deli meats/turkey, does not add salt to foods.   Denies chest pain, palpitations, shortness of breath, frequent headaches, dizziness, urinary frequency or dysuria, changes in bowel habits, abdominal pain, nausea, vomiting.    Objective:     Vitals: BP 138/88   Pulse 86   Temp 97.6 F (36.4 C) (Temporal)   Resp 14   Ht _0  (1.575 m)   Wt 221 lb (100.2 kg)   SpO2 96%   BMI 40.42 kg/m   Body mass index is 40.42 kg/m.  Physical Exam  Constitutional: She is oriented to person, place, and time and well-developed, well-nourished, and in no distress.  HENT:  Head: Normocephalic and atraumatic.  Eyes: Pupils are equal, round, and reactive to light. Conjunctivae are normal.  Neck: Neck supple.  Cardiovascular: Normal rate, regular rhythm and normal heart sounds.  Pulses:      Dorsalis pedis pulses are 2+ on the right side and 2+ on the left side.       Posterior tibial pulses are 2+ on the right side and 2+ on the left side.  Venous varicosities noted bilaterally. Trace peripheral edema of lower extremities to mid shin noted bilaterally. No ulceration or evidence of venous stasis dermatitis.  Pulmonary/Chest: Effort normal and breath sounds normal.  Abdominal: Bowel sounds are normal. She exhibits no distension. There is no abdominal tenderness.  Neurological: She is alert and oriented to person, place, and time.  Skin: Skin is warm and dry.  Psychiatric: Affect normal.  Vitals reviewed.    Advanced  Directives 03/17/2019 03/12/2018 10/22/2016 04/10/2016 01/17/2015  Does Patient Have a Medical Advance Directive? Yes Yes No No No  Type of Advance Directive Living will;Healthcare Power of Attorney Living will - - -  Copy of [place] in Chart? No - copy requested - - - -    Tobacco Social History   Tobacco Use  Smoking Status Never Smoker  Smokeless Tobacco Never Used     Counseling given: Yes   Clinical Intake:  Pre-visit preparation completed: No  Pain : No/denies pain     BMI - recorded: 40.42 Nutritional Status: BMI > 30  Obese Diabetes: Yes  How often do you need to have someone help you when you read instructions, pamphlets, or other written materials from your doctor or pharmacy?: 1 - Never  Interpreter Needed?: No     Past Medical History:  Diagnosis Date  . Adenomatous colon polyp 2003  . Allergy    seasonal  . Anxiety   . Diabetes mellitus   . Dyslipidemia   . GERD (gastroesophageal reflux disease)   . Hemorrhoids    hx of  . Hypertension   . Urolithiasis    Past Surgical History:  Procedure Laterality Date  . COLONOSCOPY    . no prior surgery     Family History  Problem Relation Age of Onset  . Cancer Sister        Colon  . Intracerebral hemorrhage Sister   . Colon cancer Sister 72  . Cancer Mother  Breast  . Heart disease Mother        CVA x 2  . Breast cancer Mother 50  . Diabetes Maternal Aunt   . Diabetes Paternal Aunt   . Aneurysm Sister   . Hypertension Sister   . Heart disease Sister    Social History   Socioeconomic History  . Marital status: Married    Spouse name: Not on file  . Number of children: Not on file  . Years of education: Not on file  . Highest education level: Not on file  Occupational History  . Occupation: Retired  Scientific laboratory technician  . Financial resource strain: Not on file  . Food insecurity    Worry: Not on file    Inability: Not on file  . Transportation needs    Medical:  Not on file    Non-medical: Not on file  Tobacco Use  . Smoking status: Never Smoker  . Smokeless tobacco: Never Used  Substance and Sexual Activity  . Alcohol use: No    Alcohol/week: 0.0 standard drinks  . Drug use: No  . Sexual activity: Not Currently  Lifestyle  . Physical activity    Days per week: Not on file    Minutes per session: Not on file  . Stress: Not on file  Relationships  . Social Herbalist on phone: Not on file    Gets together: Not on file    Attends religious service: Not on file    Active member of club or organization: Not on file    Attends meetings of clubs or organizations: Not on file    Relationship status: Not on file  Other Topics Concern  . Not on file  Social History Narrative  . Not on file    Outpatient Encounter Medications as of 03/17/2019  Medication Sig  . amLODipine (NORVASC) 5 MG tablet Take 1 tablet (5 mg total) by mouth daily.  Marland Kitchen aspirin 81 MG tablet Take 81 mg by mouth daily.    . carvedilol (COREG) 12.5 MG tablet TAKE 1 TABLET TWICE DAILY WITH A MEAL  . losartan-hydrochlorothiazide (HYZAAR) 100-25 MG tablet Take 1 tablet by mouth daily. Please call the office to schedule a follow up appointment  . metFORMIN (GLUCOPHAGE-XR) 500 MG 24 hr tablet TAKE 2 TABLETS TWICE DAILY  . triamcinolone (KENALOG) 0.025 % cream Apply 1 application topically 4 (four) times daily. As needed for rash (Patient not taking: Reported on 03/17/2019)   No facility-administered encounter medications on file as of 03/17/2019.     Activities of Daily Living In your present state of health, do you have any difficulty performing the following activities: 03/17/2019 05/06/2018  Hearing? N N  Vision? Y N  Comment following with opthalmologist -  Difficulty concentrating or making decisions? N N  Walking or climbing stairs? N N  Dressing or bathing? N N  Doing errands, shopping? N N  Preparing Food and eating ? N -  Using the Toilet? N -  In the  past six months, have you accidently leaked urine? N -  Do you have problems with loss of bowel control? N -  Managing your Medications? N -  Managing your Finances? N -  Housekeeping or managing your Housekeeping? N -  Some recent data might be hidden    Patient Care Team: Delorse Limber as PCP - General (Family Medicine) Renato Shin, MD as Consulting Physician (Endocrinology) Thelma Comp, OD (Optometry)    Assessment:  This is a routine wellness examination for [place].  Exercise Activities and Dietary recommendations Current Exercise Habits: The patient does not participate in regular exercise at present  Goals    . Weight (lb) < 215 lb (97.5 kg)     Lose weight decreasing caloric intake.        Fall Risk Fall Risk  03/17/2019 03/17/2019 05/06/2018 03/12/2018 11/04/2017  Falls in the past year? 0 0 0 0 No  Number falls in past yr: 0 0 0 - -  Injury with Fall? - 0 0 - -  Follow up - Falls evaluation completed Falls evaluation completed - -   Is the patient's home free of loose throw rugs in walkways, pet beds, electrical cords, etc?   yes      Grab bars in the bathroom? yes      Handrails on the stairs?   no stairs at home      Adequate lighting?   yes   Depression Screen PHQ 2/9 Scores 03/17/2019 05/06/2018 03/12/2018 11/04/2017  PHQ - 2 Score 0 0 0 0  PHQ- 9 Score - 0 - -     Cognitive Function MMSE - Mini Mental State Exam 03/17/2019 03/12/2018  Orientation to time 5 5  Orientation to Place 5 5  Registration 3 3  Attention/ Calculation 5 5  Recall 2 0  Language- name 2 objects 2 2  Language- repeat 1 1  Language- follow 3 step command 3 3  Language- read & follow direction 1 1  Write a sentence 1 1  Copy design 1 1  Total score 29 27        Immunization History  Administered Date(s) Administered  . Influenza, High Dose Seasonal PF 02/25/2014, 03/03/2018, 02/05/2019  . Influenza,inj,Quad PF,6+ Mos 03/07/2017  . Influenza-Unspecified  01/17/2015, 01/29/2016  . Pneumococcal Conjugate-13 01/15/2014  . Pneumococcal Polysaccharide-23 05/24/2009, 05/30/2016  . Td 08/28/1997  . Tdap 01/15/2014    Qualifies for Shingles Vaccine? yes  Screening Tests Health Maintenance  Topic Date Due  . FOOT EXAM  05/16/2019  . HEMOGLOBIN A1C  05/16/2019  . OPHTHALMOLOGY EXAM  11/20/2019  . MAMMOGRAM  02/26/2020  . COLONOSCOPY  11/14/2022  . DTaP/Tdap/Td (2 - Td) 01/16/2024  . TETANUS/TDAP  01/16/2024  . INFLUENZA VACCINE  Completed  . DEXA SCAN  Completed  . Hepatitis C Screening  Completed  . PNA vac Low Risk Adult  Completed    Cancer Screenings: Lung: Low Dose CT Chest recommended if Age 57-80 years, 30 pack-year currently smoking OR have quit w/in 15years. Patient does not qualify. Breast:  Up to date on Mammogram? Yes   Up to date of Bone Density/Dexa? Yes Colorectal: colonoscopy due 2024  Additional Screenings: Hepatitis C Screening: completed     Plan:  1. Encounter for Medicare annual wellness exam Depression screening negative.  Health maintenance reviewed. Mammogram scheduled for 03/31/2019.  Mild lower extremity edema, will increase Hyzaar dose to 100-25 mg. Discussed diet, encouraged patient to limit salt intake especially processed deli meats. Dietary recommendations handout provided.  Routine labs ordered today.  - CBC w/Diff - Comp Met (CMET) - Lipid Profile  2. Post-inflammatory hyperpigmentation Supportive measures discussed with patient. Recommend repeat assessment when active rash is present.   3. Peripheral edema Mild. Bilateral with L > R. Venous varicosities noted bilaterally. Start DASH diet. Will have her continue losartan-HCTZ. Labs today. Consider compression stocking.   I have personally reviewed and noted the following in the patient's chart:   .  Medical and social history . Use of alcohol, tobacco or illicit drugs  . Current medications and supplements . Functional ability and status .  Nutritional status . Physical activity . Advanced directives . List of other physicians . Hospitalizations, surgeries, and ER visits in previous 12 months . Vitals . Screenings to include cognitive, depression, and falls . Referrals and appointments  In addition, I have reviewed and discussed with patient certain preventive protocols, quality metrics, and best practice recommendations. A written personalized care plan for preventive services as well as general preventive health recommendations were provided to patient.     Leeanne Rio, PA-C  03/17/2019

## 2019-03-19 ENCOUNTER — Other Ambulatory Visit: Payer: Self-pay | Admitting: *Deleted

## 2019-03-19 DIAGNOSIS — D72829 Elevated white blood cell count, unspecified: Secondary | ICD-10-CM

## 2019-03-31 ENCOUNTER — Encounter: Payer: Self-pay | Admitting: Emergency Medicine

## 2019-03-31 ENCOUNTER — Ambulatory Visit
Admission: RE | Admit: 2019-03-31 | Discharge: 2019-03-31 | Disposition: A | Discharge status: Home / Self Care | Payer: Medicare HMO | Source: Ambulatory Visit | Attending: Physician Assistant | Admitting: Physician Assistant

## 2019-03-31 ENCOUNTER — Other Ambulatory Visit: Payer: Self-pay

## 2019-03-31 DIAGNOSIS — Z1231 Encounter for screening mammogram for malignant neoplasm of breast: Secondary | ICD-10-CM | POA: Diagnosis not present

## 2019-04-01 ENCOUNTER — Ambulatory Visit (INDEPENDENT_AMBULATORY_CARE_PROVIDER_SITE_OTHER): Payer: Medicare HMO

## 2019-04-01 DIAGNOSIS — D72829 Elevated white blood cell count, unspecified: Secondary | ICD-10-CM

## 2019-04-01 NOTE — Addendum Note (Signed)
Addended by: Doran Clay A on: 04/01/2019 09:50 AM   Modules accepted: Orders

## 2019-04-02 ENCOUNTER — Ambulatory Visit: Payer: Medicare HMO

## 2019-04-02 LAB — COMPREHENSIVE METABOLIC PANEL
AG Ratio: 1.5 (calc) (ref 1.0–2.5)
ALT: 43 U/L — ABNORMAL HIGH (ref 6–29)
AST: 36 U/L — ABNORMAL HIGH (ref 10–35)
Albumin: 3.8 g/dL (ref 3.6–5.1)
Alkaline phosphatase (APISO): 68 U/L (ref 37–153)
BUN: 18 mg/dL (ref 7–25)
CO2: 25 mmol/L (ref 20–32)
Calcium: 9.7 mg/dL (ref 8.6–10.4)
Chloride: 103 mmol/L (ref 98–110)
Creat: 0.89 mg/dL (ref 0.60–0.93)
Globulin: 2.5 g/dL (calc) (ref 1.9–3.7)
Glucose, Bld: 146 mg/dL — ABNORMAL HIGH (ref 65–99)
Potassium: 3.9 mmol/L (ref 3.5–5.3)
Sodium: 139 mmol/L (ref 135–146)
Total Bilirubin: 0.5 mg/dL (ref 0.2–1.2)
Total Protein: 6.3 g/dL (ref 6.1–8.1)

## 2019-04-02 LAB — CBC WITH DIFFERENTIAL/PLATELET
Absolute Monocytes: 618 cells/uL (ref 200–950)
Basophils Absolute: 48 cells/uL (ref 0–200)
Basophils Relative: 0.5 %
Eosinophils Absolute: 323 cells/uL (ref 15–500)
Eosinophils Relative: 3.4 %
HCT: 40.6 % (ref 35.0–45.0)
Hemoglobin: 13.3 g/dL (ref 11.7–15.5)
Lymphs Abs: 3240 cells/uL (ref 850–3900)
MCH: 29.8 pg (ref 27.0–33.0)
MCHC: 32.8 g/dL (ref 32.0–36.0)
MCV: 90.8 fL (ref 80.0–100.0)
MPV: 10.6 fL (ref 7.5–12.5)
Monocytes Relative: 6.5 %
Neutro Abs: 5273 cells/uL (ref 1500–7800)
Neutrophils Relative %: 55.5 %
Platelets: 311 10*3/uL (ref 140–400)
RBC: 4.47 10*6/uL (ref 3.80–5.10)
RDW: 12.3 % (ref 11.0–15.0)
Total Lymphocyte: 34.1 %
WBC: 9.5 10*3/uL (ref 3.8–10.8)

## 2019-04-03 ENCOUNTER — Other Ambulatory Visit: Payer: Self-pay

## 2019-04-03 DIAGNOSIS — R748 Abnormal levels of other serum enzymes: Secondary | ICD-10-CM

## 2019-04-17 ENCOUNTER — Telehealth: Payer: Self-pay

## 2019-04-17 NOTE — Telephone Encounter (Signed)
Ricardie with [employer] [phone] called in requesting for patient to have a follow up appt for her elevated BP or to have another BP medication added on to current regimen. Will be faxing a order to PCP.

## 2019-04-17 NOTE — Telephone Encounter (Signed)
Spoke with patient and she states she advised her insurance co that her blood pressure was elevated at her last visit with PCP but did come down. They sent her a blood pressure monitor. Unsure if they will be faxing anything to the office for PCP signature.   Advised patient to check blood pressures with her new monitor and if her levels are elevated to notify the office/PCP. She is agreeable.

## 2019-05-16 DIAGNOSIS — Z20828 Contact with and (suspected) exposure to other viral communicable diseases: Secondary | ICD-10-CM | POA: Diagnosis not present

## 2019-05-18 ENCOUNTER — Telehealth: Payer: Self-pay

## 2019-05-18 NOTE — Telephone Encounter (Signed)
Patient has tested positive for COVID, wanted advice from PCP on what to take to help with symptoms. Please advise

## 2019-05-18 NOTE — Telephone Encounter (Signed)
Can take 1000 units D3 QD, 1000 mg Vitamin C once to twice daily and an OTC Zinc supplement. Increase fluids. Rest. Mucinex to thin congestion.  I would need her to do a video visit or get a full symptoms inventory to help further

## 2019-05-18 NOTE — Telephone Encounter (Signed)
MyChart message sent to patient with PCP recommendations

## 2019-05-20 ENCOUNTER — Other Ambulatory Visit: Payer: Self-pay

## 2019-05-20 ENCOUNTER — Ambulatory Visit (INDEPENDENT_AMBULATORY_CARE_PROVIDER_SITE_OTHER): Payer: Medicare HMO | Admitting: Physician Assistant

## 2019-05-20 ENCOUNTER — Encounter: Payer: Self-pay | Admitting: Physician Assistant

## 2019-05-20 DIAGNOSIS — U071 COVID-19: Secondary | ICD-10-CM | POA: Diagnosis not present

## 2019-05-20 MED ORDER — BENZONATATE 100 MG PO CAPS: 100.0000 mg | ORAL_CAPSULE | Freq: Three times a day (TID) | ORAL | 0 refills | Status: DC | PRN

## 2019-05-20 NOTE — Progress Notes (Signed)
Virtual Visit via Video   I connected with patient on 05/20/19 at  1:00 PM EST by a video enabled telemedicine application and verified that I am speaking with the correct person using two identifiers.  Location patient: Home Location provider: [facility] Persons participating in the virtual visit: Patient, Provider, Ewa Gentry (Patina Moore)  I discussed the limitations of evaluation and management by telemedicine and the availability of in person appointments. The patient expressed understanding and agreed to proceed.  Subjective:   HPI:   Patient presents via doxy doxy.me today after being diagnosed with Covid on 05/19/2019.  Patient endorses having nonproductive cough associated with loss of smell and fatigue. Symptoms starting about 4 days ago.  Denies fever, chills, chest pain, shortness of breath, GI symptoms. Denies recent travel. Has been staying at home but her cousin did come to her house and is hospitalized with COVID currently.  Is taking Mucinex, vitamin C, vitamin D, zinc and elderberry.   ROS:   See pertinent positives and negatives per HPI.  Patient Active Problem List   Diagnosis Date Noted  . Visit for preventive health examination 05/06/2018  . Liver disorder 10/22/2016  . Need for 23-polyvalent pneumococcal polysaccharide vaccine 06/02/2016  . Neck pain on right side 06/02/2016  . NASH (nonalcoholic steatohepatitis) 04/11/2016  . History of adenomatous polyp of colon 10/17/2015  . Morbid obesity ([place]) 10/17/2015  . GERD (gastroesophageal reflux disease) 08/15/2015  . Low TSH level 01/15/2014  . Menopausal state 12/17/2012  . Undiagnosed cardiac murmurs 12/17/2012  . Varicose veins 08/18/2010  . TIA 05/17/2008  . HYPERCHOLESTEROLEMIA 03/18/2008  . URINARY CALCULUS 03/18/2008  . HERPES ZOSTER WITHOUT MENTION OF COMPLICATION 01/77/9390  . Diabetes ([place]) 12/28/2006  . ANXIETY 12/28/2006  . Essential hypertension 12/28/2006    Social History    Tobacco Use  . Smoking status: Never Smoker  . Smokeless tobacco: Never Used  Substance Use Topics  . Alcohol use: No    Alcohol/week: 0.0 standard drinks    Current Outpatient Medications:  .  amLODipine (NORVASC) 5 MG tablet, Take 1 tablet (5 mg total) by mouth daily., Disp: 90 tablet, Rfl: 1 .  aspirin 81 MG tablet, Take 81 mg by mouth daily.  , Disp: , Rfl:  .  carvedilol (COREG) 12.5 MG tablet, TAKE 1 TABLET TWICE DAILY WITH A MEAL, Disp: 180 tablet, Rfl: 1 .  losartan-hydrochlorothiazide (HYZAAR) 100-25 MG tablet, Take 1 tablet by mouth daily. Please call the office to schedule a follow up appointment, Disp: 90 tablet, Rfl: 1 .  metFORMIN (GLUCOPHAGE-XR) 500 MG 24 hr tablet, TAKE 2 TABLETS TWICE DAILY, Disp: 360 tablet, Rfl: 3 .  triamcinolone (KENALOG) 0.025 % cream, Apply 1 application topically 4 (four) times daily. As needed for rash, Disp: 30 g, Rfl: 3  No Known Allergies  Objective:   There were no vitals taken for this visit.  Patient is well-developed, well-nourished in no acute distress.  Resting comfortably at home.  Head is normocephalic, atraumatic.  No labored breathing.  Speech is clear and coherent with logical content.  Patient is alert and oriented at baseline.   Assessment and Plan:   1. COVID-19 Thankfully patient with mild symptoms.  No fever or shortness of breath at present.  We will have her continue vitamin C, vitamin D and zinc supplementation.  Continue Mucinex twice daily.  Increase fluids.  Rest.  Rx Tessalon.  Patient enrolled in MyChart symptom monitoring program.  Strict ER precautions reviewed with patient.  Written instructions sent to patient's MyChart.  - benzonatate (TESSALON) 100 MG capsule; Take 1 capsule (100 mg total) by mouth 3 (three) times daily as needed for cough.  Dispense: 30 capsule; Refill: 0 - MyChart COVID-19 home monitoring program; Future - Temperature monitoring; Future    Leeanne Rio,  PA-C 05/20/2019

## 2019-05-20 NOTE — Progress Notes (Signed)
I have discussed the procedure for the virtual visit with the patient who has given consent to proceed with assessment and treatment.   Jennifer Saunders, CMA     

## 2019-05-20 NOTE — Patient Instructions (Signed)
Instructions sent to MyChart.   Please keep well-hydrated and get plenty of rest. Please continue Mucinex. Please continue vitamin C, vitamin D, zinc and elderberry. If you have a humidifier, place it in the bedroom and run at night. Use the Tessalon as directed for cough.  Please make sure to complete the daily Covid symptom screenings.  This way we can keep a closer eye on you.  If you develop any significant shortness of breath or other severe symptoms, please be seen at nearest ER.

## 2019-05-22 ENCOUNTER — Telehealth: Payer: Self-pay | Admitting: *Deleted

## 2019-05-22 NOTE — Telephone Encounter (Signed)
She is agreeable to go to the ER or Urgent care if she becomes sob or winded at rest.

## 2019-05-22 NOTE — Telephone Encounter (Signed)
Pt stated covid-19 poss  She is wheezing today  Please advised

## 2019-05-22 NOTE — Telephone Encounter (Signed)
It is expected to be a little winded with exertion with this. I am glad there are no symptoms at rest. No severe symptoms and all her other initial symptoms are improving. If she notes worsening of breathing or SOB at rest, she needs to be seen at [facility] or ER

## 2019-05-22 NOTE — Telephone Encounter (Signed)
This message was sent to me. I do not see where pt was sent to triage for wheezing.

## 2019-05-22 NOTE — Telephone Encounter (Signed)
Spoke with patient and when exertion herself she becomes sob and winded.  When she is at rest she doesn't have the winded or sob. She is talking normal on the phone.  She is taking her Vitamins, Mucinex daily and Tessalon. The cough is improved.  She doesn't have a pulse oxygen to check O2 levels.  Denies any fever or chills.

## 2019-05-22 NOTE — Telephone Encounter (Signed)
Please call patient to assess further. If significant SOB/wheezing needs to be seen at Urgent Care/ER

## 2019-05-28 ENCOUNTER — Ambulatory Visit: Payer: Medicare HMO

## 2019-06-02 ENCOUNTER — Other Ambulatory Visit: Payer: Self-pay

## 2019-06-02 ENCOUNTER — Ambulatory Visit (INDEPENDENT_AMBULATORY_CARE_PROVIDER_SITE_OTHER): Payer: Medicare HMO

## 2019-06-02 DIAGNOSIS — R748 Abnormal levels of other serum enzymes: Secondary | ICD-10-CM | POA: Diagnosis not present

## 2019-06-02 LAB — HEPATIC FUNCTION PANEL
ALT: 28 U/L (ref 0–35)
AST: 21 U/L (ref 0–37)
Albumin: 3.8 g/dL (ref 3.5–5.2)
Alkaline Phosphatase: 63 U/L (ref 39–117)
Bilirubin, Direct: 0.1 mg/dL (ref 0.0–0.3)
Total Bilirubin: 0.5 mg/dL (ref 0.2–1.2)
Total Protein: 6.5 g/dL (ref 6.0–8.3)

## 2019-06-08 ENCOUNTER — Telehealth: Payer: Self-pay | Admitting: Physician Assistant

## 2019-06-08 NOTE — Telephone Encounter (Signed)
This is common after COVID infection and can take some time to improve/resolve. We can send her to Pulmonology for SOB after COVID for further assessment/management. Do not feel need for chest x-ray since energy back to normal and no mention of fevers or chest congestion. Is the pain she experiencing constant or coming and going, any change with movement or position change? Any pain with breathing. If there is pain with breathing I would want her evaluated at Urgent Care or our [facility] respiratory clinic.

## 2019-06-08 NOTE — Telephone Encounter (Signed)
Spoke with patient and said she is not having pain with breathing. The pain/soreness comes and goes. Does not improved or start with movement to position change. She wanted to hold off on referral to Pulmonology for now. She will call back if any reoccurrences

## 2019-06-08 NOTE — Telephone Encounter (Signed)
Pt called in stating that she is having some shortness of breath when she moving around. She states that at times she has to take a deep breath. She also states that she is having pain on her left side mostly at night. She states that she has to sit up in order to ease the pain. Pt can be reached at the home # she thinks this could be side effects from [place]. Please advise

## 2019-06-08 NOTE — Telephone Encounter (Signed)
Spoke with patient. She explains that the sob occurs with talking a lot. She becomes winded after talking a lot.The pain is occurring on left side/breast region and moving to shoulder. She says the pain is a soreness. Pain is not bad enough to take medication.  Energy is normal, not easily fatigued.

## 2019-06-13 ENCOUNTER — Emergency Department (HOSPITAL_BASED_OUTPATIENT_CLINIC_OR_DEPARTMENT_OTHER): Payer: Medicare HMO

## 2019-06-13 ENCOUNTER — Other Ambulatory Visit: Payer: Self-pay

## 2019-06-13 ENCOUNTER — Encounter (HOSPITAL_BASED_OUTPATIENT_CLINIC_OR_DEPARTMENT_OTHER): Payer: Self-pay | Admitting: Emergency Medicine

## 2019-06-13 ENCOUNTER — Emergency Department (HOSPITAL_BASED_OUTPATIENT_CLINIC_OR_DEPARTMENT_OTHER)
Admission: EM | Admit: 2019-06-13 | Discharge: 2019-06-13 | Disposition: A | Discharge status: Home / Self Care | Payer: Medicare HMO | Attending: Emergency Medicine | Admitting: Emergency Medicine

## 2019-06-13 DIAGNOSIS — R0602 Shortness of breath: Secondary | ICD-10-CM | POA: Diagnosis not present

## 2019-06-13 DIAGNOSIS — E119 Type 2 diabetes mellitus without complications: Secondary | ICD-10-CM | POA: Insufficient documentation

## 2019-06-13 DIAGNOSIS — Z7984 Long term (current) use of oral hypoglycemic drugs: Secondary | ICD-10-CM | POA: Insufficient documentation

## 2019-06-13 DIAGNOSIS — Z7982 Long term (current) use of aspirin: Secondary | ICD-10-CM | POA: Insufficient documentation

## 2019-06-13 DIAGNOSIS — I10 Essential (primary) hypertension: Secondary | ICD-10-CM | POA: Insufficient documentation

## 2019-06-13 DIAGNOSIS — Z79899 Other long term (current) drug therapy: Secondary | ICD-10-CM | POA: Insufficient documentation

## 2019-06-13 DIAGNOSIS — R0600 Dyspnea, unspecified: Secondary | ICD-10-CM | POA: Diagnosis not present

## 2019-06-13 LAB — CBC WITH DIFFERENTIAL/PLATELET
Abs Immature Granulocytes: 0.05 10*3/uL (ref 0.00–0.07)
Basophils Absolute: 0.1 10*3/uL (ref 0.0–0.1)
Basophils Relative: 1 %
Eosinophils Absolute: 0.4 10*3/uL (ref 0.0–0.5)
Eosinophils Relative: 4 %
HCT: 40.1 % (ref 36.0–46.0)
Hemoglobin: 13.4 g/dL (ref 12.0–15.0)
Immature Granulocytes: 1 %
Lymphocytes Relative: 32 %
Lymphs Abs: 3.4 10*3/uL (ref 0.7–4.0)
MCH: 30.1 pg (ref 26.0–34.0)
MCHC: 33.4 g/dL (ref 30.0–36.0)
MCV: 90.1 fL (ref 80.0–100.0)
Monocytes Absolute: 0.7 10*3/uL (ref 0.1–1.0)
Monocytes Relative: 7 %
Neutro Abs: 5.9 10*3/uL (ref 1.7–7.7)
Neutrophils Relative %: 55 %
Platelets: 267 10*3/uL (ref 150–400)
RBC: 4.45 MIL/uL (ref 3.87–5.11)
RDW: 12.6 % (ref 11.5–15.5)
WBC: 10.5 10*3/uL (ref 4.0–10.5)
nRBC: 0 % (ref 0.0–0.2)

## 2019-06-13 LAB — BASIC METABOLIC PANEL WITH GFR
Anion gap: 9 (ref 5–15)
BUN: 14 mg/dL (ref 8–23)
CO2: 26 mmol/L (ref 22–32)
Calcium: 9.8 mg/dL (ref 8.9–10.3)
Chloride: 101 mmol/L (ref 98–111)
Creatinine, Ser: 0.83 mg/dL (ref 0.44–1.00)
GFR calc Af Amer: >60 mL/min
GFR calc non Af Amer: >60 mL/min
Glucose, Bld: 117 mg/dL — ABNORMAL HIGH (ref 70–99)
Potassium: 3.8 mmol/L (ref 3.5–5.1)
Sodium: 136 mmol/L (ref 135–145)

## 2019-06-13 LAB — TROPONIN I (HIGH SENSITIVITY): Troponin I (High Sensitivity): 5 ng/L

## 2019-06-13 LAB — BRAIN NATRIURETIC PEPTIDE: B Natriuretic Peptide: 192.4 pg/mL — ABNORMAL HIGH (ref 0.0–100.0)

## 2019-06-13 NOTE — ED Notes (Signed)
Patient denies pain and is resting comfortably.  

## 2019-06-13 NOTE — ED Triage Notes (Signed)
Pt here 2 weeks post covid with SOB at rest and on exertion since this morning. Initial Covid infection symptoms were loss of taste and smell and nothing else.

## 2019-06-13 NOTE — ED Notes (Signed)
ED Provider at bedside. 

## 2019-06-17 NOTE — ED Provider Notes (Signed)
[facility] EMERGENCY DEPARTMENT Provider Note   CSN: [id number] Arrival date & time: 06/13/19  4098     History Chief Complaint  Patient presents with  . Shortness of Breath    Jennifer Saunders is a 69 y.o. female.  HPI    69 year old female with dyspnea.  She has had intermittent symptoms for the past several weeks.  She reports that she recently recovered from Covid.  She feels like her symptoms session improved with a half.  She has been having intermittent dyspnea.  Seems to be worse with activity.  No new cough.  No fevers.  No unusual swelling.  No orthopnea.  Past Medical History:  Diagnosis Date  . Adenomatous colon polyp 2003  . Allergy    seasonal  . Anxiety   . Diabetes mellitus   . Dyslipidemia   . GERD (gastroesophageal reflux disease)   . Hemorrhoids    hx of  . Hypertension   . Urolithiasis     Patient Active Problem List   Diagnosis Date Noted  . Visit for preventive health examination 05/06/2018  . Liver disorder 10/22/2016  . Need for 23-polyvalent pneumococcal polysaccharide vaccine 06/02/2016  . Neck pain on right side 06/02/2016  . NASH (nonalcoholic steatohepatitis) 04/11/2016  . History of adenomatous polyp of colon 10/17/2015  . Morbid obesity ([place]) 10/17/2015  . GERD (gastroesophageal reflux disease) 08/15/2015  . Low TSH level 01/15/2014  . Menopausal state 12/17/2012  . Undiagnosed cardiac murmurs 12/17/2012  . Varicose veins 08/18/2010  . TIA 05/17/2008  . HYPERCHOLESTEROLEMIA 03/18/2008  . URINARY CALCULUS 03/18/2008  . HERPES ZOSTER WITHOUT MENTION OF COMPLICATION 11/91/4782  . Diabetes ([place]) 12/28/2006  . ANXIETY 12/28/2006  . Essential hypertension 12/28/2006    Past Surgical History:  Procedure Laterality Date  . COLONOSCOPY    . no prior surgery       OB History   No obstetric history on file.     Family History  Problem Relation Age of Onset  . Cancer Sister        Colon  . Intracerebral hemorrhage  Sister   . Colon cancer Sister 37  . Cancer Mother        Breast  . Heart disease Mother        CVA x 2  . Breast cancer Mother 45  . Diabetes Maternal Aunt   . Diabetes Paternal Aunt   . Aneurysm Sister   . Hypertension Sister   . Heart disease Sister     Social History   Tobacco Use  . Smoking status: Never Smoker  . Smokeless tobacco: Never Used  Substance Use Topics  . Alcohol use: No    Alcohol/week: 0.0 standard drinks  . Drug use: No    Home Medications Prior to Admission medications   Medication Sig Start Date End Date Taking? Authorizing Provider  amLODipine (NORVASC) 5 MG tablet Take 1 tablet (5 mg total) by mouth daily. 03/13/19  Yes Brunetta Jeans, PA-C  aspirin 81 MG tablet Take 81 mg by mouth daily.     Yes [provider]  carvedilol (COREG) 12.5 MG tablet TAKE 1 TABLET TWICE DAILY WITH A MEAL 03/13/19  Yes Brunetta Jeans, PA-C  losartan-hydrochlorothiazide (HYZAAR) 100-25 MG tablet Take 1 tablet by mouth daily. Please call the office to schedule a follow up appointment 12/22/18  Yes Brunetta Jeans, PA-C  metFORMIN (GLUCOPHAGE-XR) 500 MG 24 hr tablet TAKE 2 TABLETS TWICE DAILY 09/03/18  Yes Loanne Drilling,  Hilliard Clark, MD    Allergies    Patient has no known allergies.  Review of Systems   Review of Systems All systems reviewed and negative, other than as noted in HPI.  Physical Exam Updated Vital Signs BP 136/71 (BP Location: Left Arm)   Pulse 77   Temp 98.3 F (36.8 C) (Oral)   Resp 20   Ht 5' 3"  (1.6 m)   Wt 99.8 kg   SpO2 100%   BMI 38.97 kg/m   Physical Exam Vitals and nursing note reviewed.  Constitutional:      General: She is not in acute distress.    Appearance: She is well-developed. She is obese.  HENT:     Head: Normocephalic and atraumatic.  Eyes:     General:        Right eye: No discharge.        Left eye: No discharge.     Conjunctiva/sclera: Conjunctivae normal.  Cardiovascular:     Rate and Rhythm: Normal rate and  regular rhythm.     Heart sounds: Normal heart sounds. No murmur. No friction rub. No gallop.   Pulmonary:     Effort: Pulmonary effort is normal. No respiratory distress.     Breath sounds: Normal breath sounds. Decreased breath sounds:    Abdominal:     General: There is no distension.     Palpations: Abdomen is soft.     Tenderness: There is no abdominal tenderness.  Musculoskeletal:        General: No tenderness.     Cervical back: Neck supple.     Comments: Lower extremities symmetric as compared to each other. No calf tenderness. Negative Homan's. No palpable cords.   Skin:    General: Skin is warm and dry.  Neurological:     Mental Status: She is alert.  Psychiatric:        Behavior: Behavior normal.        Thought Content: Thought content normal.     ED Results / Procedures / Treatments   Labs (all labs ordered are listed, but only abnormal results are displayed) Labs Reviewed  BRAIN NATRIURETIC PEPTIDE - Abnormal; Notable for the following components:      Result Value   B Natriuretic Peptide 192.4 (*)    All other components within normal limits  BASIC METABOLIC PANEL - Abnormal; Notable for the following components:   Glucose, Bld 117 (*)    All other components within normal limits  CBC WITH DIFFERENTIAL/PLATELET  TROPONIN I (HIGH SENSITIVITY)    EKG EKG Interpretation  Date/Time:  Saturday June 13 2019 09:29:50 EST Ventricular Rate:  94 PR Interval:    QRS Duration: 98 QT Interval:  375 QTC Calculation: 469 R Axis:   67 Text Interpretation: Sinus rhythm Low voltage, precordial leads Baseline wander in lead(s) V6 Confirmed by Virgel Manifold [phone]) on 06/13/2019 9:35:07 AM   Radiology No results found.   DG Chest Port 1 View  Result Date: 06/13/2019 CLINICAL DATA:  Two weeks post COVID with shortness of breath onset this morning. EXAM: PORTABLE CHEST 1 VIEW COMPARISON:  None. FINDINGS: Borderline cardiomegaly. Lungs are clear. No pleural  effusion or pneumothorax is seen. Osseous structures about the chest are unremarkable. IMPRESSION: No acute findings. No evidence of pneumonia. Borderline cardiomegaly. Electronically Signed   By: Franki Cabot M.D.   On: 06/13/2019 10:32      Procedures Procedures (including critical care time)  Medications Ordered in ED Medications - No data to  display  ED Course  I have reviewed the triage vital signs and the nursing notes.  Pertinent labs & imaging results that were available during my care of the patient were reviewed by me and considered in my medical decision making (see chart for details).    MDM Rules/Calculators/A&P                      69 year old female with vague dyspnea.  Ongoing for weeks.  She appears well.  No increased work of breathing.  Lungs sound clear.  O2 saturations are normal on room air.  Chest x-ray is clear.  BMP is mildly elevated but she does not appear to be overtly volume overloaded.  Not significantly anemic.  I doubt PE.  Doubt atypical ACS.  May be some degree of deconditioning.  Regardless, I doubt emergent process.  Return precautions were discussed.  Outpatient follow-up with her PCP otherwise.  Final Clinical Impression(s) / ED Diagnoses Final diagnoses:  Dyspnea, unspecified type    Rx / DC Orders ED Discharge Orders    None       Virgel Manifold, MD 06/17/19 [phone]

## 2019-06-22 ENCOUNTER — Other Ambulatory Visit: Payer: Self-pay | Admitting: Physician Assistant

## 2019-07-17 ENCOUNTER — Telehealth: Payer: Self-pay

## 2019-07-17 DIAGNOSIS — L810 Postinflammatory hyperpigmentation: Secondary | ICD-10-CM

## 2019-07-17 NOTE — Telephone Encounter (Signed)
Patient is requesting Rx Triamcinolone ([place]) to be sent to [facility]. Patient advised provider until 07/20/19.

## 2019-07-21 NOTE — Telephone Encounter (Signed)
In reviewing patient chart. We had a discussion that the Triamcinolone cream did not help with the rash symptoms. Jennifer Saunders never prescribed medication. Only was getting from Endo.

## 2019-07-22 MED ORDER — TRIAMCINOLONE ACETONIDE 0.1 % EX CREA: 1.0000 "application " | TOPICAL_CREAM | Freq: Two times a day (BID) | CUTANEOUS | 0 refills | Status: DC

## 2019-07-22 NOTE — Addendum Note (Signed)
Addended by: Leonidas Romberg on: 07/22/2019 10:35 AM   Modules accepted: Orders

## 2019-07-22 NOTE — Telephone Encounter (Signed)
Spoke with patient states the rash reoccurs occasional. She says it is little bumps that itches. When the rash goes away leave a dark area. She says the Triamcinolone cream does help but using Cortisone cream When she does use it on the rash the rash improves. It keeps occurring in the same areas. The last rash was located on breast so she didn't want to send a picture. She will have her daughter take a picture next time and upload to my chart.

## 2019-08-20 ENCOUNTER — Encounter: Payer: Self-pay | Admitting: Physician Assistant

## 2019-08-24 MED ORDER — CLOTRIMAZOLE-BETAMETHASONE 1-0.05 % EX CREA: 1.0000 "application " | TOPICAL_CREAM | Freq: Two times a day (BID) | CUTANEOUS | 0 refills | Status: DC

## 2019-09-28 ENCOUNTER — Other Ambulatory Visit: Payer: Self-pay | Admitting: Physician Assistant

## 2019-09-28 DIAGNOSIS — I10 Essential (primary) hypertension: Secondary | ICD-10-CM

## 2019-11-19 ENCOUNTER — Telehealth: Payer: Self-pay | Admitting: Endocrinology

## 2019-11-19 ENCOUNTER — Telehealth: Payer: Self-pay | Admitting: Physician Assistant

## 2019-11-19 MED ORDER — METFORMIN HCL ER 500 MG PO TB24: 1000.0000 mg | ORAL_TABLET | Freq: Two times a day (BID) | ORAL | 0 refills | Status: DC

## 2019-11-19 NOTE — Telephone Encounter (Signed)
Medication Refill Request  . Did you call your pharmacy and request this refill first? Yes  . If patient has not contacted pharmacy first, instruct them to do so for future refills.  . Remind them that contacting the pharmacy for their refill is the quickest method to get the refill.  . Refill policy also stated that it will take anywhere between 24-72 hours to receive the refill.    Name of medication? Metformin XR  Is this a 90 day supply? no  Name and location of [facility]? [facility] on [address]  As an FYI Patient did call PCP to get refill on Metformin since PCP is now managing her diabetes. Elyn Aquas is current PCP, but patient states is out of medicine and Elyn Aquas is not in office today.

## 2019-11-19 NOTE — Telephone Encounter (Signed)
Medication sent in. 

## 2019-11-19 NOTE — Telephone Encounter (Signed)
Patient is calling in to get refill on Metformin. This is a prescription that Dr. Loanne Drilling has been prescribing.  States that Dr. Loanne Drilling is no longer going to be seeing her for her.  States she addressed this at her last visit with Elyn Aquas.   I have advised patient that Einar Pheasant is out today.  Patient states she only has 2 pills left.  I have advised her to reach out to Dr. Cordelia Pen office to see if they could get a script sent in for her until Einar Pheasant gets back into the office.

## 2019-11-19 NOTE — Telephone Encounter (Signed)
In reviewing patient chart. Her last appointment with Endocrinology/Dr Loanne Drilling was 11/13/18. Her last POCTA1C was 6.5 on 11/13/18 He advised for patient to continue her Metformin but she could see him as needed. Please advise if you are taking over her diabetes? If so she is due for a follow up appointment. There is another message that patient called Endo for refill of medication

## 2019-11-20 ENCOUNTER — Encounter: Payer: Self-pay | Admitting: Emergency Medicine

## 2019-11-20 DIAGNOSIS — H52223 Regular astigmatism, bilateral: Secondary | ICD-10-CM | POA: Diagnosis not present

## 2019-11-20 DIAGNOSIS — E119 Type 2 diabetes mellitus without complications: Secondary | ICD-10-CM | POA: Diagnosis not present

## 2019-11-20 DIAGNOSIS — H524 Presbyopia: Secondary | ICD-10-CM | POA: Diagnosis not present

## 2019-11-20 DIAGNOSIS — H2513 Age-related nuclear cataract, bilateral: Secondary | ICD-10-CM | POA: Diagnosis not present

## 2019-11-20 LAB — HM DIABETES EYE EXAM

## 2019-11-20 NOTE — Telephone Encounter (Signed)
Patient scheduled for diabetes follow up on Monday 11/23/19

## 2019-11-20 NOTE — Telephone Encounter (Signed)
If I am to take over managing her diabetes she will need follow-up every 6 months. Looks like she is a year overdue from last A1C. Needs appt.

## 2019-11-23 ENCOUNTER — Encounter: Payer: Self-pay | Admitting: Physician Assistant

## 2019-11-23 ENCOUNTER — Other Ambulatory Visit: Payer: Self-pay

## 2019-11-23 ENCOUNTER — Ambulatory Visit (INDEPENDENT_AMBULATORY_CARE_PROVIDER_SITE_OTHER): Payer: Medicare HMO | Admitting: Physician Assistant

## 2019-11-23 ENCOUNTER — Other Ambulatory Visit (INDEPENDENT_AMBULATORY_CARE_PROVIDER_SITE_OTHER): Payer: Medicare HMO

## 2019-11-23 VITALS — BP 120/76 | HR 76 | Temp 97.3°F | Resp 14 | Ht 63.0 in | Wt 217.0 lb

## 2019-11-23 DIAGNOSIS — I10 Essential (primary) hypertension: Secondary | ICD-10-CM

## 2019-11-23 DIAGNOSIS — E119 Type 2 diabetes mellitus without complications: Secondary | ICD-10-CM

## 2019-11-23 LAB — HEMOGLOBIN A1C: Hgb A1c MFr Bld: 6.5 % (ref 4.6–6.5)

## 2019-11-23 LAB — COMPREHENSIVE METABOLIC PANEL
ALT: 40 U/L — ABNORMAL HIGH (ref 0–35)
AST: 31 U/L (ref 0–37)
Albumin: 4.2 g/dL (ref 3.5–5.2)
Alkaline Phosphatase: 70 U/L (ref 39–117)
BUN: 13 mg/dL (ref 6–23)
CO2: 31 mEq/L (ref 19–32)
Calcium: 10 mg/dL (ref 8.4–10.5)
Chloride: 101 mEq/L (ref 96–112)
Creatinine, Ser: 0.83 mg/dL (ref 0.40–1.20)
GFR: 81.99 mL/min (ref >60.00)
Glucose, Bld: 105 mg/dL — ABNORMAL HIGH (ref 70–99)
Potassium: 3.8 mEq/L (ref 3.5–5.1)
Sodium: 138 mEq/L (ref 135–145)
Total Bilirubin: 0.8 mg/dL (ref 0.2–1.2)
Total Protein: 7.3 g/dL (ref 6.0–8.3)

## 2019-11-23 LAB — LIPID PANEL
Cholesterol: 108 mg/dL (ref 0–200)
HDL: 33.6 mg/dL — ABNORMAL LOW (ref >39.00)
LDL Cholesterol: 54 mg/dL (ref 0–99)
NonHDL: 74.28
Total CHOL/HDL Ratio: 3
Triglycerides: 100 mg/dL (ref 0.0–149.0)
VLDL: 20 mg/dL (ref 0.0–40.0)

## 2019-11-23 NOTE — Progress Notes (Signed)
History of Present Illness: Patient is a 69 y.o. female who presents to clinic today for follow-up of Diabetes Mellitus II, controlled without complication.  Patient currently on medication regimen of Metformin 500 mg BID. Is also taking combination of Losartan-HCTZ, amlodipine and Carvedilol for BP.  Endorses taking medications as directed. Endorses keeping a well-balanced diet. Tries to walk daily.  Denies any numbness or tingling of extremities. Denies vision changes. Patient denies chest pain, palpitations, lightheadedness, dizziness, vision changes or frequent headaches.  Latest Maintenance: A1C --  Lab Results  Component Value Date   HGBA1C 6.5 (A) 11/13/2018   Diabetic Eye Exam -- UTD. No retinopathy. Urine Microalbumin -- On ARB Foot Exam -- Due. Denies concerns today.   Past Medical History:  Diagnosis Date   Adenomatous colon polyp 2003   Allergy    seasonal   Anxiety    Diabetes mellitus    Dyslipidemia    GERD (gastroesophageal reflux disease)    Hemorrhoids    hx of   Hypertension    Urolithiasis     Current Outpatient Medications on File Prior to Visit  Medication Sig Dispense Refill   amLODipine (NORVASC) 5 MG tablet TAKE 1 TABLET EVERY DAY 90 tablet 1   aspirin 81 MG tablet Take 81 mg by mouth daily.       carvedilol (COREG) 12.5 MG tablet TAKE 1 TABLET TWICE DAILY WITH A MEAL 180 tablet 1   clotrimazole-betamethasone (LOTRISONE) cream Apply 1 application topically 2 (two) times daily. For up to 2 weeks. 30 g 0   losartan-hydrochlorothiazide (HYZAAR) 100-25 MG tablet TAKE 1 TABLET EVERY DAY (NEED MD APPOINTMENT) 90 tablet 1   metFORMIN (GLUCOPHAGE-XR) 500 MG 24 hr tablet Take 2 tablets (1,000 mg total) by mouth 2 (two) times daily. 360 tablet 0   No current facility-administered medications on file prior to visit.    No Known Allergies  Family History  Problem Relation Age of Onset   Cancer Sister        Colon   Intracerebral  hemorrhage Sister    Colon cancer Sister 52   Cancer Mother        Breast   Heart disease Mother        CVA x 2   Breast cancer Mother 18   Diabetes Maternal Aunt    Diabetes Paternal Aunt    Aneurysm Sister    Hypertension Sister    Heart disease Sister     Social History   Socioeconomic History   Marital status: Married    Spouse name: Not on file   Number of children: Not on file   Years of education: Not on file   Highest education level: Not on file  Occupational History   Occupation: Retired  Tobacco Use   Smoking status: Never Smoker   Smokeless tobacco: Never Used  Scientific laboratory technician Use: Never used  Substance and Sexual Activity   Alcohol use: No    Alcohol/week: 0.0 standard drinks   Drug use: No   Sexual activity: Not Currently  Other Topics Concern   Not on file  Social History Narrative   Not on file   Social Determinants of Health   Financial Resource Strain:    Difficulty of Paying Living Expenses:   Food Insecurity:    Worried About Charity fundraiser in the Last Year:    [place] in the Last Year:   Transportation Needs:    Lack of  Transportation (Medical):    Lack of Transportation (Non-Medical):   Physical Activity:    Days of Exercise per Week:    Minutes of Exercise per Session:   Stress:    Feeling of Stress :   Social Connections:    Frequency of Communication with Friends and Family:    Frequency of Social Gatherings with Friends and Family:    Attends Religious Services:    Active Member of Clubs or Organizations:    Attends Music therapist:    Marital Status:    Review of Systems: Pertinent ROS are listed in HPI  Physical Examination: BP 120/76    Pulse 76    Temp (!) 97.3 F (36.3 C) (Temporal)    Resp 14    Ht 5' 3"  (1.6 m)    Wt (!) 217 lb (98.4 kg)    SpO2 97%    BMI 38.44 kg/m  General appearance: alert, cooperative, appears stated age and no distress Lungs:  clear to auscultation bilaterally Heart: regular rate and rhythm, S1, S2 normal, no murmur, click, rub or gallop Extremities: extremities normal, atraumatic, no cyanosis or edema Pulses: 2+ and symmetric  Diabetic Foot Form - Detailed   Diabetic Foot Exam - detailed Diabetic Foot exam was performed with the following findings: Yes 11/23/2019 10:09 AM  Visual Foot Exam completed.: Yes  Can the patient see the bottom of their feet?: Yes Are the shoes appropriate in style and fit?: Yes Is there swelling or and abnormal foot shape?: No Is there a claw toe deformity?: No Is there elevated skin temparature?: No Is there foot or ankle muscle weakness?: No Normal Range of Motion: Yes Pulse Foot Exam completed.: Yes  Right posterior Tibialias: Present Left posterior Tibialias: Present  Right Dorsalis Pedis: Present Left Dorsalis Pedis: Present  Sensory Foot Exam Completed.: Yes Semmes-Weinstein Monofilament Test R Site 1-Great Toe: Pos L Site 1-Great Toe: Pos        Assessment/Plan: 1. Type 2 diabetes mellitus without complication, without long-term current use of insulin ([place]) Foot exam updated. No concerning findings. Eye exam recently updated. No sign of retinopathy. BP stable today. Dietary and exercise recommendations reviewed. Immunizations UTD. Will repeat Lipid, CMP and A1C today. Will adjust regimen accordingly.  - Lipid panel - Hemoglobin A1c - Comprehensive metabolic panel  2. Essential hypertension BP normotensive. Asymptomatic. Continue current regimen.    This visit occurred during the SARS-CoV-2 public health emergency.  Safety protocols were in place, including screening questions prior to the visit, additional usage of staff PPE, and extensive cleaning of exam room while observing appropriate contact time as indicated for disinfecting solutions.

## 2019-11-23 NOTE — Patient Instructions (Signed)
Please go to the lab today for blood work.  I will call you with your results. We will alter treatment regimen(s) if indicated by your results.   Please keep working on diet and exercise.  Continue medications as directed. I am glad you are doing so well!   Diabetes Mellitus and Exercise Exercising regularly is important for your overall health, especially when you have diabetes (diabetes mellitus). Exercising is not only about losing weight. It has many other health benefits, such as increasing muscle strength and bone density and reducing body fat and stress. This leads to improved fitness, flexibility, and endurance, all of which result in better overall health. Exercise has additional benefits for people with diabetes, including:  Reducing appetite.  Helping to lower and control blood glucose.  Lowering blood pressure.  Helping to control amounts of fatty substances (lipids) in the blood, such as cholesterol and triglycerides.  Helping the body to respond better to insulin (improving insulin sensitivity).  Reducing how much insulin the body needs.  Decreasing the risk for heart disease by: ? Lowering cholesterol and triglyceride levels. ? Increasing the levels of good cholesterol. ? Lowering blood glucose levels. What is my activity plan? Your health care provider or certified diabetes educator can help you make a plan for the type and frequency of exercise (activity plan) that works for you. Make sure that you:  Do at least 150 minutes of moderate-intensity or vigorous-intensity exercise each week. This could be brisk walking, biking, or water aerobics. ? Do stretching and strength exercises, such as yoga or weightlifting, at least 2 times a week. ? Spread out your activity over at least 3 days of the week.  Get some form of physical activity every day. ? Do not go more than 2 days in a row without some kind of physical activity. ? Avoid being inactive for more than 30  minutes at a time. Take frequent breaks to walk or stretch.  Choose a type of exercise or activity that you enjoy, and set realistic goals.  Start slowly, and gradually increase the intensity of your exercise over time. What do I need to know about managing my diabetes?   Check your blood glucose before and after exercising. ? If your blood glucose is 240 mg/dL (13.3 mmol/L) or higher before you exercise, check your urine for ketones. If you have ketones in your urine, do not exercise until your blood glucose returns to normal. ? If your blood glucose is 100 mg/dL (5.6 mmol/L) or lower, eat a snack containing 15-20 grams of carbohydrate. Check your blood glucose 15 minutes after the snack to make sure that your level is above 100 mg/dL (5.6 mmol/L) before you start your exercise.  Know the symptoms of low blood glucose (hypoglycemia) and how to treat it. Your risk for hypoglycemia increases during and after exercise. Common symptoms of hypoglycemia can include: ? Hunger. ? Anxiety. ? Sweating and feeling clammy. ? Confusion. ? Dizziness or feeling light-headed. ? Increased heart rate or palpitations. ? Blurry vision. ? Tingling or numbness around the mouth, lips, or tongue. ? Tremors or shakes. ? Irritability.  Keep a rapid-acting carbohydrate snack available before, during, and after exercise to help prevent or treat hypoglycemia.  Avoid injecting insulin into areas of the body that are going to be exercised. For example, avoid injecting insulin into: ? The arms, when playing tennis. ? The legs, when jogging.  Keep records of your exercise habits. Doing this can help you and your health  care provider adjust your diabetes management plan as needed. Write down: ? Food that you eat before and after you exercise. ? Blood glucose levels before and after you exercise. ? The type and amount of exercise you have done. ? When your insulin is expected to peak, if you use insulin. Avoid  exercising at times when your insulin is peaking.  When you start a new exercise or activity, work with your health care provider to make sure the activity is safe for you, and to adjust your insulin, medicines, or food intake as needed.  Drink plenty of water while you exercise to prevent dehydration or heat stroke. Drink enough fluid to keep your urine clear or pale yellow. Summary  Exercising regularly is important for your overall health, especially when you have diabetes (diabetes mellitus).  Exercising has many health benefits, such as increasing muscle strength and bone density and reducing body fat and stress.  Your health care provider or certified diabetes educator can help you make a plan for the type and frequency of exercise (activity plan) that works for you.  When you start a new exercise or activity, work with your health care provider to make sure the activity is safe for you, and to adjust your insulin, medicines, or food intake as needed. This information is not intended to replace advice given to you by your health care provider. Make sure you discuss any questions you have with your health care provider. Document Revised: 11/08/2016 Document Reviewed: 09/26/2015 Elsevier Patient Education  [place].

## 2019-11-23 NOTE — Addendum Note (Signed)
Addended by: Fritz Pickerel on: 11/23/2019 10:42 AM   Modules accepted: Orders

## 2019-11-24 ENCOUNTER — Other Ambulatory Visit: Payer: Self-pay

## 2019-11-24 DIAGNOSIS — R748 Abnormal levels of other serum enzymes: Secondary | ICD-10-CM

## 2019-11-25 NOTE — Telephone Encounter (Signed)
closing

## 2019-12-23 ENCOUNTER — Other Ambulatory Visit: Payer: Self-pay | Admitting: Physician Assistant

## 2019-12-25 ENCOUNTER — Other Ambulatory Visit: Payer: Self-pay | Admitting: Emergency Medicine

## 2019-12-25 MED ORDER — METFORMIN HCL ER 500 MG PO TB24: 1000.0000 mg | ORAL_TABLET | Freq: Two times a day (BID) | ORAL | 1 refills | Status: DC

## 2020-01-19 ENCOUNTER — Other Ambulatory Visit (INDEPENDENT_AMBULATORY_CARE_PROVIDER_SITE_OTHER): Payer: Medicare HMO

## 2020-01-19 DIAGNOSIS — R748 Abnormal levels of other serum enzymes: Secondary | ICD-10-CM | POA: Diagnosis not present

## 2020-01-19 LAB — HEPATIC FUNCTION PANEL
ALT: 36 U/L — ABNORMAL HIGH (ref 0–35)
AST: 26 U/L (ref 0–37)
Albumin: 4.1 g/dL (ref 3.5–5.2)
Alkaline Phosphatase: 69 U/L (ref 39–117)
Bilirubin, Direct: 0.1 mg/dL (ref 0.0–0.3)
Total Bilirubin: 0.5 mg/dL (ref 0.2–1.2)
Total Protein: 7 g/dL (ref 6.0–8.3)

## 2020-01-28 ENCOUNTER — Telehealth: Payer: Self-pay | Admitting: Physician Assistant

## 2020-01-28 NOTE — Progress Notes (Signed)
  Chronic Care Management   Note  01/28/2020 Name: Jennifer Saunders MRN: [id number] DOB: 1950-10-12  Jennifer Saunders is a 69 y.o. year old female who is a primary care patient of Delorse Limber. I reached out to [name] by phone today in response to a referral sent by [name] PCP, Brunetta Jeans, PA-C.   [name] was given information about Chronic Care Management services today including:  1. CCM service includes personalized support from designated clinical staff supervised by her physician, including individualized plan of care and coordination with other care providers 2. 24/7 contact phone numbers for assistance for urgent and routine care needs. 3. Service will only be billed when office clinical staff spend 20 minutes or more in a month to coordinate care. 4. Only one practitioner may furnish and bill the service in a calendar month. 5. The patient may stop CCM services at any time (effective at the end of the month) by phone call to the office staff.   Patient agreed to services and verbal consent obtained.   Follow up plan:   Carley Perdue [employer] Scheduler

## 2020-02-10 ENCOUNTER — Other Ambulatory Visit: Payer: Self-pay | Admitting: Endocrinology

## 2020-03-10 ENCOUNTER — Ambulatory Visit: Payer: Medicare HMO

## 2020-03-10 DIAGNOSIS — I10 Essential (primary) hypertension: Secondary | ICD-10-CM

## 2020-03-10 DIAGNOSIS — E119 Type 2 diabetes mellitus without complications: Secondary | ICD-10-CM

## 2020-03-10 NOTE — Progress Notes (Signed)
Chronic Care Management Pharmacy  Name: Jennifer Saunders MRN: [id number]   DOB: 12-05-1950  Chief Complaint/ HPI [name], 69 y.o., female, presents for their initial CCM visit with the clinical pharmacist via telephone due to COVID-19 pandemic.  PCP : Brunetta Jeans, PA-C Encounter Diagnoses  Name Primary?  . Type 2 diabetes mellitus without complication, without long-term current use of insulin ([place]) Yes  . Essential hypertension    Patient Active Problem List   Diagnosis Date Noted  . Visit for preventive health examination 05/06/2018  . Liver disorder 10/22/2016  . Need for 23-polyvalent pneumococcal polysaccharide vaccine 06/02/2016  . Neck pain on right side 06/02/2016  . NASH (nonalcoholic steatohepatitis) 04/11/2016  . History of adenomatous polyp of colon 10/17/2015  . Morbid obesity ([place]) 10/17/2015  . GERD (gastroesophageal reflux disease) 08/15/2015  . Low TSH level 01/15/2014  . Menopausal state 12/17/2012  . Undiagnosed cardiac murmurs 12/17/2012  . Varicose veins 08/18/2010  . TIA 05/17/2008  . HYPERCHOLESTEROLEMIA 03/18/2008  . URINARY CALCULUS 03/18/2008  . HERPES ZOSTER WITHOUT MENTION OF COMPLICATION 80/22/3361  . Diabetes ([place]) 12/28/2006  . ANXIETY 12/28/2006  . Essential hypertension 12/28/2006   Past Surgical History:  Procedure Laterality Date  . COLONOSCOPY    . no prior surgery     Family History  Problem Relation Age of Onset  . Cancer Sister        Colon  . Intracerebral hemorrhage Sister   . Colon cancer Sister 87  . Cancer Mother        Breast  . Heart disease Mother        CVA x 2  . Breast cancer Mother 44  . Diabetes Maternal Aunt   . Diabetes Paternal Aunt   . Aneurysm Sister   . Hypertension Sister   . Heart disease Sister    No Known Allergies Outpatient Encounter Medications as of 03/10/2020  Medication Sig  . amLODipine (NORVASC) 5 MG tablet TAKE 1 TABLET EVERY DAY  . aspirin 81 MG tablet Take 81 mg by  mouth daily.    . carvedilol (COREG) 12.5 MG tablet TAKE 1 TABLET TWICE DAILY WITH A MEAL  . clotrimazole-betamethasone (LOTRISONE) cream Apply 1 application topically 2 (two) times daily. For up to 2 weeks.  Marland Kitchen losartan-hydrochlorothiazide (HYZAAR) 100-25 MG tablet Take 1 tablet by mouth daily.  . metFORMIN (GLUCOPHAGE-XR) 500 MG 24 hr tablet TAKE 2 TABLETS BY MOUTH TWICE A DAY   No facility-administered encounter medications on file as of 03/10/2020.   Patient Care Team    Relationship Specialty Notifications Start End  Brunetta Jeans, [place] PCP - General Family Medicine  05/30/16   Renato Shin, MD Consulting Physician Endocrinology  03/12/18   Thelma Comp, OD  Optometry  03/12/18   Madelin Rear, [facility] Pharmacist Pharmacist  01/28/20    Comment: Phone: [phone]   Current Diagnosis/Assessment: Goals Addressed            This Visit's Progress   . PharmD Care Plan       CARE PLAN ENTRY (see longitudinal plan of care for additional care plan information)  Current Barriers:  . Chronic Disease Management support, education, and care coordination needs related to Hypertension, Hyperlipidemia, and Diabetes   Hypertension BP Readings from Last 3 Encounters:  11/23/19 120/76  06/13/19 136/71  03/17/19 138/84   . Pharmacist Clinical Goal(s): o Over the next 180 days, patient will work with PharmD and providers to maintain BP goal <130/80 .  Current regimen:  . Losartan-HCTZ 100-25 once daily . Amlodipine 5 mg once daily . Carvedilol 12.5 mg twice daily with meal . Interventions: o Diet/exercise recommendations . Patient self care activities - Over the next 180 days, patient will: o Check BP at least once daily, document, and provide at future appointments o Ensure daily salt intake < 2300 mg/day  Diabetes Lab Results  Component Value Date/Time   HGBA1C 6.5 11/23/2019 11:10 AM   HGBA1C 6.5 (A) 11/13/2018 08:18 AM   HGBA1C 6.5 05/06/2018 10:19 AM   . Pharmacist  Clinical Goal(s): o Over the next 180 days, patient will work with PharmD and providers to maintain A1c goal <7% . Current regimen:  o Metformin XR 500 mg twice daily . Interventions: o BG Monitor Coverage Review - Rx Request . Patient self care activities - Over the next 180 days, patient will: o Check blood sugar as directed, document, and provide at future appointments o Contact provider with any episodes of hypoglycemia  Medication management . Pharmacist Clinical Goal(s): o Over the next 180 days, patient will work with PharmD and providers to maintain optimal medication adherence . Current pharmacy: [facility] . Interventions o Comprehensive medication review performed. o Continue current medication management strategy . Patient self care activities - Over the next 180 days, patient will: o Take medications as prescribed o Report any questions or concerns to PharmD and/or provider(s) Please see past updates related to this goal by clicking on the "Past Updates" button in the selected goal .      Hypertension   BP goal <130/80  BP Readings from Last 3 Encounters:  11/23/19 120/76  06/13/19 136/71  03/17/19 138/84   Previous medications: n/a, patient checks BP at home, at goal with home headings.  Patient is currently at goal on the following medications:  . Losartan-HCTZ 100-25 once daily . Amlodipine 5 mg once daily . Carvedilol 12.5 mg twice daily with meal  We discussed diet and exercise extensively - exercise a [facility], limit salt intake, limit intake of sugary foods/snacks.   Plan  Continue current medications.  Diabetes   A1c goal < 7 %  No results found for: VITAMINB12 Lab Results  Component Value Date/Time   HGBA1C 6.5 11/23/2019 11:10 AM   HGBA1C 6.5 (A) 11/13/2018 08:18 AM   HGBA1C 6.5 05/06/2018 10:19 AM   MICROALBUR <0.7 05/06/2018 10:19 AM   MICROALBUR <0.7 04/10/2016 03:24 PM    Recent FBG readings: n/a.  Requesting a new glucose monitor today. Previous medications: Actos. Patient is currently at goal on the following medications:  Marland Kitchen Metformin XR 500 mg twice daily   We discussed: diet and exercise extensively.   Plan  Continue current medications. RPH to request BG monitor and review cost DM supplies.   Hyperlipidemia   LDL goal < 70  Lipid Panel     Component Value Date/Time   CHOL 108 11/23/2019 1110   TRIG 100.0 11/23/2019 1110   HDL 33.60 (L) 11/23/2019 1110   LDLCALC 54 11/23/2019 1110    Hepatic Function Latest Ref Rng & Units 01/19/2020 11/23/2019 06/02/2019  Total Protein 6.0 - 8.3 g/dL 7.0 7.3 6.5  Albumin 3.5 - 5.2 g/dL 4.1 4.2 3.8  AST 0 - 37 U/L 26 31 21   ALT 0 - 35 U/L 36(H) 40(H) 28  Alk Phosphatase 39 - 117 U/L 69 70 63  Total Bilirubin 0.2 - 1.2 mg/dL 0.5 0.8 0.5  Bilirubin, Direct 0.0 - 0.3 mg/dL  0.1 - 0.1    The ASCVD Risk score Mikey Bussing DC Jr., et al., 2013) failed to calculate for the following reasons:   The valid total cholesterol range is 130 to 320 mg/dL   Patient has failed these meds in past:  Patient is currently at goal the following medications:  . N/A.  On ASA, TIA hx. Denies any abnormal bruising, bleeding from nose or gums or blood in urine or stool. Patient is currently controlled on the following medications:  . Aspirin 81 mg once daily.  We discussed diet and exercise extensively.   Plan  Continue current medications.  Osteopenia   Last DEXA Scan: 2014 - osteopenia.   No results found for: VD25OH.  Patient is not a candidate for pharmacologic treatment.  Current medications: n/a.  We discussed: Recommend [phone] units of vitamin D daily. Recommend 1200 mg of calcium daily from dietary and supplemental sources. Recommend weight-bearing and muscle strengthening exercises for building and maintaining bone density.  Plan  Recommend [phone] units of vitamin D daily.  Recommend 1200 mg of calcium daily from dietary and supplemental  sources.  Recommend weight-bearing and muscle strengthening exercises for building and maintaining bone density..  Vaccines   Immunization History  Administered Date(s) Administered  . Influenza, High Dose Seasonal PF 02/25/2014, 03/03/2018, 02/05/2019  . Influenza,inj,Quad PF,6+ Mos 03/07/2017  . Influenza-Unspecified 01/17/2015, 01/29/2016  . PFIZER SARS-COV-2 Vaccination 07/11/2019, 08/04/2019  . Pneumococcal Conjugate-13 01/15/2014  . Pneumococcal Polysaccharide-23 05/24/2009, 05/30/2016  . Td 08/28/1997  . Tdap 01/15/2014   Reviewed and discussed patient's vaccination history.  [facility]  Plan  Recommended patient receive pfizer vaccine booster.  Medication Management / Care Coordination   [facility]  [id number]: [phone]: [facility] [address] [place]Phone: [phone]: [phone]  Denies any issues with current medication management.   Plan  Continue current medication management strategy. ___________________________ SDOH (Social Determinants of Health) assessments performed: Yes.  Future Appointments  Date Time Provider DDem[place] 09/09/2020  1:00 PM [facility] CCM PHARMACIST [facility]   Visit follow-up:  . CPA follow-up: 03/2020 DM f/u . RPH follow-up: 6 month telephone visit DM, HTN.  JMadelin Rear Pharm.D., BCGP Clinical Pharmacist [facility] [phone]

## 2020-03-10 NOTE — Patient Instructions (Signed)
Please review care plan below and call me at [phone] with any questions!  Thank you, Edyth Gunnels., Clinical Pharmacist  Goals Addressed            This Visit's Progress    PharmD Care Plan       CARE PLAN ENTRY (see longitudinal plan of care for additional care plan information)  Current Barriers:   Chronic Disease Management support, education, and care coordination needs related to Hypertension, Hyperlipidemia, and Diabetes   Hypertension BP Readings from Last 3 Encounters:  11/23/19 120/76  06/13/19 136/71  03/17/19 138/84    Pharmacist Clinical Goal(s): o Over the next 180 days, patient will work with PharmD and providers to maintain BP goal <130/80  Current regimen:   Losartan-HCTZ 100-25 once daily  Amlodipine 5 mg once daily  Carvedilol 12.5 mg twice daily with meal  Interventions: o Diet/exercise recommendations  Patient self care activities - Over the next 180 days, patient will: o Check BP at least once daily, document, and provide at future appointments o Ensure daily salt intake < 2300 mg/day  Diabetes Lab Results  Component Value Date/Time   HGBA1C 6.5 11/23/2019 11:10 AM   HGBA1C 6.5 (A) 11/13/2018 08:18 AM   HGBA1C 6.5 05/06/2018 10:19 AM    Pharmacist Clinical Goal(s): o Over the next 180 days, patient will work with PharmD and providers to maintain A1c goal <7%  Current regimen:  o Metformin XR 500 mg twice daily  Interventions: o BG Monitor Coverage Review - Rx Request  Patient self care activities - Over the next 180 days, patient will: o Check blood sugar as directed, document, and provide at future appointments o Contact provider with any episodes of hypoglycemia  Medication management  Pharmacist Clinical Goal(s): o Over the next 180 days, patient will work with PharmD and providers to maintain optimal medication adherence  Current pharmacy: [facility]  Interventions o Comprehensive medication review  performed. o Continue current medication management strategy  Patient self care activities - Over the next 180 days, patient will: o Take medications as prescribed o Report any questions or concerns to PharmD and/or provider(s) Please see past updates related to this goal by clicking on the "Past Updates" button in the selected goal .      The patient verbalized understanding of instructions provided today and agreed to receive a mailed copy of patient instruction and/or educational materials. Telephone follow up appointment with pharmacy team member scheduled for: See next appointment with "Care Management Staff" under "What's Next" below.   Madelin Rear, Pharm.D., BCGP Clinical Pharmacist [place] [phone]  Exercising to Lose Weight Exercise is structured, repetitive physical activity to improve fitness and health. Getting regular exercise is important for everyone. It is especially important if you are overweight. Being overweight increases your risk of heart disease, stroke, diabetes, high blood pressure, and several types of cancer. Reducing your calorie intake and exercising can help you lose weight. Exercise is usually categorized as moderate or vigorous intensity. To lose weight, most people need to do a certain amount of moderate-intensity or vigorous-intensity exercise each week. Moderate-intensity exercise  Moderate-intensity exercise is any activity that gets you moving enough to burn at least three times more energy (calories) than if you were sitting. Examples of moderate exercise include:  Walking a mile in 15 minutes.  Doing light yard work.  Biking at an easy pace. Most people should get at least 150 minutes (2 hours and 30 minutes) a week  of moderate-intensity exercise to maintain their body weight. Vigorous-intensity exercise Vigorous-intensity exercise is any activity that gets you moving enough to burn at least six times more calories than if you  were sitting. When you exercise at this intensity, you should be working hard enough that you are not able to carry on a conversation. Examples of vigorous exercise include:  Running.  Playing a team sport, such as football, basketball, and soccer.  Jumping rope. Most people should get at least 75 minutes (1 hour and 15 minutes) a week of vigorous-intensity exercise to maintain their body weight. How can exercise affect me? When you exercise enough to burn more calories than you eat, you lose weight. Exercise also reduces body fat and builds muscle. The more muscle you have, the more calories you burn. Exercise also:  Improves mood.  Reduces stress and tension.  Improves your overall fitness, flexibility, and endurance.  Increases bone strength. The amount of exercise you need to lose weight depends on:  Your age.  The type of exercise.  Any health conditions you have.  Your overall physical ability. Talk to your health care provider about how much exercise you need and what types of activities are safe for you. What actions can I take to lose weight? Nutrition   Make changes to your diet as told by your health care provider or diet and nutrition specialist (dietitian). This may include: ? Eating fewer calories. ? Eating more protein. ? Eating less unhealthy fats. ? Eating a diet that includes fresh fruits and vegetables, whole grains, low-fat dairy products, and lean protein. ? Avoiding foods with added fat, salt, and sugar.  Drink plenty of water while you exercise to prevent dehydration or heat stroke. Activity  Choose an activity that you enjoy and set realistic goals. Your health care provider can help you make an exercise plan that works for you.  Exercise at a moderate or vigorous intensity most days of the week. ? The intensity of exercise may vary from person to person. You can tell how intense a workout is for you by paying attention to your breathing and  heartbeat. Most people will notice their breathing and heartbeat get faster with more intense exercise.  Do resistance training twice each week, such as: ? Push-ups. ? Sit-ups. ? Lifting weights. ? Using resistance bands.  Getting short amounts of exercise can be just as helpful as long structured periods of exercise. If you have trouble finding time to exercise, try to include exercise in your daily routine. ? Get up, stretch, and walk around every 30 minutes throughout the day. ? Go for a walk during your lunch break. ? Park your car farther away from your destination. ? If you take public transportation, get off one stop early and walk the rest of the way. ? Make phone calls while standing up and walking around. ? Take the stairs instead of elevators or escalators.  Wear comfortable clothes and shoes with good support.  Do not exercise so much that you hurt yourself, feel dizzy, or get very short of breath. Where to find more information  U.S. Department of Health and Human Services: Bonded[employer].at  Centers for Disease Control and Prevention (CDC): http://www.wolf.info/ Contact a health care provider:  Before starting a new exercise program.  If you have questions or concerns about your weight.  If you have a medical problem that keeps you from exercising. Get help right away if you have any of the following while exercising:  Injury.  Dizziness.  Difficulty breathing or shortness of breath that does not go away when you stop exercising.  Chest pain.  Rapid heartbeat. Summary  Being overweight increases your risk of heart disease, stroke, diabetes, high blood pressure, and several types of cancer.  Losing weight happens when you burn more calories than you eat.  Reducing the amount of calories you eat in addition to getting regular moderate or vigorous exercise each week helps you lose weight. This information is not intended to replace advice given to you by your health care  provider. Make sure you discuss any questions you have with your health care provider. Document Revised: 04/29/2017 Document Reviewed: 04/29/2017 Elsevier Patient Education  [place].   Diabetes Mellitus and Nutrition, Adult When you have diabetes (diabetes mellitus), it is very important to have healthy eating habits because your blood sugar (glucose) levels are greatly affected by what you eat and drink. Eating healthy foods in the appropriate amounts, at about the same times every day, can help you:  Control your blood glucose.  Lower your risk of heart disease.  Improve your blood pressure.  Reach or maintain a healthy weight. Every person with diabetes is different, and each person has different needs for a meal plan. Your health care provider may recommend that you work with a diet and nutrition specialist (dietitian) to make a meal plan that is best for you. Your meal plan may vary depending on factors such as:  The calories you need.  The medicines you take.  Your weight.  Your blood glucose, blood pressure, and cholesterol levels.  Your activity level.  Other health conditions you have, such as heart or kidney disease. How do carbohydrates affect me? Carbohydrates, also called carbs, affect your blood glucose level more than any other type of food. Eating carbs naturally raises the amount of glucose in your blood. Carb counting is a method for keeping track of how many carbs you eat. Counting carbs is important to keep your blood glucose at a healthy level, especially if you use insulin or take certain oral diabetes medicines. It is important to know how many carbs you can safely have in each meal. This is different for every person. Your dietitian can help you calculate how many carbs you should have at each meal and for each snack. Foods that contain carbs include:  Bread, cereal, rice, pasta, and crackers.  Potatoes and corn.  Peas, beans, and  lentils.  Milk and yogurt.  Fruit and juice.  Desserts, such as cakes, cookies, ice cream, and candy. How does alcohol affect me? Alcohol can cause a sudden decrease in blood glucose (hypoglycemia), especially if you use insulin or take certain oral diabetes medicines. Hypoglycemia can be a life-threatening condition. Symptoms of hypoglycemia (sleepiness, dizziness, and confusion) are similar to symptoms of having too much alcohol. If your health care provider says that alcohol is safe for you, follow these guidelines:  Limit alcohol intake to no more than 1 drink per day for nonpregnant women and 2 drinks per day for men. One drink equals 12 oz of beer, 5 oz of wine, or 1 oz of hard liquor.  Do not drink on an empty stomach.  Keep yourself hydrated with water, diet soda, or unsweetened iced tea.  Keep in mind that regular soda, juice, and other mixers may contain a lot of sugar and must be counted as carbs. What are tips for following this plan?  Reading food labels  Start by checking the  serving size on the "Nutrition Facts" label of packaged foods and drinks. The amount of calories, carbs, fats, and other nutrients listed on the label is based on one serving of the item. Many items contain more than one serving per package.  Check the total grams (g) of carbs in one serving. You can calculate the number of servings of carbs in one serving by dividing the total carbs by 15. For example, if a food has 30 g of total carbs, it would be equal to 2 servings of carbs.  Check the number of grams (g) of saturated and trans fats in one serving. Choose foods that have low or no amount of these fats.  Check the number of milligrams (mg) of salt (sodium) in one serving. Most people should limit total sodium intake to less than 2,300 mg per day.  Always check the nutrition information of foods labeled as "low-fat" or "nonfat". These foods may be higher in added sugar or refined carbs and should  be avoided.  Talk to your dietitian to identify your daily goals for nutrients listed on the label. Shopping  Avoid buying canned, premade, or processed foods. These foods tend to be high in fat, sodium, and added sugar.  Shop around the outside edge of the grocery store. This includes fresh fruits and vegetables, bulk grains, fresh meats, and fresh dairy. Cooking  Use low-heat cooking methods, such as baking, instead of high-heat cooking methods like deep frying.  Cook using healthy oils, such as olive, canola, or sunflower oil.  Avoid cooking with butter, cream, or high-fat meats. Meal planning  Eat meals and snacks regularly, preferably at the same times every day. Avoid going long periods of time without eating.  Eat foods high in fiber, such as fresh fruits, vegetables, beans, and whole grains. Talk to your dietitian about how many servings of carbs you can eat at each meal.  Eat 4-6 ounces (oz) of lean protein each day, such as lean meat, chicken, fish, eggs, or tofu. One oz of lean protein is equal to: ? 1 oz of meat, chicken, or fish. ? 1 egg. ?  cup of tofu.  Eat some foods each day that contain healthy fats, such as avocado, nuts, seeds, and fish. Lifestyle  Check your blood glucose regularly.  Exercise regularly as told by your health care provider. This may include: ? 150 minutes of moderate-intensity or vigorous-intensity exercise each week. This could be brisk walking, biking, or water aerobics. ? Stretching and doing strength exercises, such as yoga or weightlifting, at least 2 times a week.  Take medicines as told by your health care provider.  Do not use any products that contain nicotine or tobacco, such as cigarettes and e-cigarettes. If you need help quitting, ask your health care provider.  Work with a Social worker or diabetes educator to identify strategies to manage stress and any emotional and social challenges. Questions to ask a health care  provider  Do I need to meet with a diabetes educator?  Do I need to meet with a dietitian?  What number can I call if I have questions?  When are the best times to check my blood glucose? Where to find more information:  American Diabetes Association: diabetes.org  Academy of Nutrition and Dietetics: www.eatright.[employer] of Diabetes and Digestive and Kidney Diseases (NIH): [employer].dk Summary  A healthy meal plan will help you control your blood glucose and maintain a healthy lifestyle.  Working with a diet and nutrition  specialist (dietitian) can help you make a meal plan that is best for you.  Keep in mind that carbohydrates (carbs) and alcohol have immediate effects on your blood glucose levels. It is important to count carbs and to use alcohol carefully. This information is not intended to replace advice given to you by your health care provider. Make sure you discuss any questions you have with your health care provider. Document Revised: 03/29/2017 Document Reviewed: 05/21/2016 Elsevier Patient Education  2020 [employer].

## 2020-03-11 ENCOUNTER — Telehealth: Payer: Self-pay | Admitting: Physician Assistant

## 2020-03-11 NOTE — Telephone Encounter (Signed)
Left message for patient to schedule Annual Wellness Visit.  Please schedule with Nurse Health Advisor Martha Stanley, RN at [place] [facility]  

## 2020-03-11 NOTE — Telephone Encounter (Signed)
Pt has been scheduled.  °

## 2020-03-14 ENCOUNTER — Other Ambulatory Visit: Payer: Self-pay | Admitting: Physician Assistant

## 2020-03-14 ENCOUNTER — Telehealth: Payer: Self-pay

## 2020-03-14 NOTE — Progress Notes (Signed)
Chronic Care Management Pharmacy Assistant   Name: Jennifer Saunders  MRN: [id number] DOB: [birth year]  Reason for Encounter: Initial Visit  Patient Questions:  1.  Have you seen any other providers since your last visit?  Patient states she has not seen any other provider since last visit.   2.  Any changes in your medicines or health?  Patient states she has no change in medications or her health at this time.  Jennifer Saunders,  69 y.o. , female presents for their Initial CCM visit with the clinical pharmacist via telephone.  PCP : Jennifer Jeans, PA-C  Allergies:  No Known Allergies  Medications: Outpatient Encounter Medications as of 03/14/2020  Medication Sig  . amLODipine (NORVASC) 5 MG tablet TAKE 1 TABLET EVERY DAY  . aspirin 81 MG tablet Take 81 mg by mouth daily.    . carvedilol (COREG) 12.5 MG tablet TAKE 1 TABLET TWICE DAILY WITH A MEAL  . clotrimazole-betamethasone (LOTRISONE) cream Apply 1 application topically 2 (two) times daily. For up to 2 weeks.  Marland Kitchen FLUZONE HIGH-DOSE QUADRIVALENT 0.7 ML SUSY   . losartan-hydrochlorothiazide (HYZAAR) 100-25 MG tablet Take 1 tablet by mouth daily.  . metFORMIN (GLUCOPHAGE-XR) 500 MG 24 hr tablet TAKE 2 TABLETS BY MOUTH TWICE A DAY   No facility-administered encounter medications on file as of 03/14/2020.    Current Diagnosis: Patient Active Problem List   Diagnosis Date Noted  . Visit for preventive health examination 05/06/2018  . Liver disorder 10/22/2016  . Need for 23-polyvalent pneumococcal polysaccharide vaccine 06/02/2016  . Neck pain on right side 06/02/2016  . NASH (nonalcoholic steatohepatitis) 04/11/2016  . History of adenomatous polyp of colon 10/17/2015  . Morbid obesity ([place]) 10/17/2015  . GERD (gastroesophageal reflux disease) 08/15/2015  . Low TSH level 01/15/2014  . Menopausal state 12/17/2012  . Undiagnosed cardiac murmurs 12/17/2012  . Varicose veins 08/18/2010  . TIA 05/17/2008  .  HYPERCHOLESTEROLEMIA 03/18/2008  . URINARY CALCULUS 03/18/2008  . HERPES ZOSTER WITHOUT MENTION OF COMPLICATION 97/58/8325  . Diabetes ([place]) 12/28/2006  . ANXIETY 12/28/2006  . Essential hypertension 12/28/2006   Have you seen any other providers since your last visit?  Patient states she has not seen another provider since last visit.  Any changes in your medications or health?  Patient states no changes in her medications or health at this time.  Any side effects from any medications? Patient states no side effects from her medications at this time.   Do you have any symptoms or problems not managed by your medications?  Patient states she has no symptoms or problems at this time.  Any concerns about your health right now?  Patient states she has no concerns about her health right now.  Has your provider asked that you check blood pressure, blood sugar, or follow special diet at home? Patient states she does not check her blood pressure, blood sugar, and she is not on a special diet at this time.   Do you get any type of exercise on a regular basis?  Patient states she walks 3 times per week.  Can you think of a goal you would like to reach for your health? Patient states she would like to lose 30 pounds.  Do you have any problems getting your medications?  Patient states she does not have any problems getting her medications at this time.  Is there anything that you would like to discuss during the appointment? Patient states she does  not have anything to discuss at this time.   [place] Pharmacist Assistant [phone]  Follow-Up:  Pharmacist Review

## 2020-03-15 ENCOUNTER — Other Ambulatory Visit: Payer: Self-pay | Admitting: Physician Assistant

## 2020-03-15 ENCOUNTER — Other Ambulatory Visit: Payer: Self-pay | Admitting: Emergency Medicine

## 2020-03-15 ENCOUNTER — Telehealth: Payer: Self-pay

## 2020-03-15 DIAGNOSIS — Z1231 Encounter for screening mammogram for malignant neoplasm of breast: Secondary | ICD-10-CM

## 2020-03-15 DIAGNOSIS — E119 Type 2 diabetes mellitus without complications: Secondary | ICD-10-CM

## 2020-03-15 MED ORDER — TRUE METRIX BLOOD GLUCOSE TEST VI STRP: ORAL_STRIP | 12 refills | Status: AC

## 2020-03-15 MED ORDER — ALCOHOL SWABS PADS: MEDICATED_PAD | 3 refills | Status: AC

## 2020-03-15 MED ORDER — TRUEPLUS LANCETS 33G MISC: 3 refills | Status: AC

## 2020-03-15 MED ORDER — TRUE METRIX LEVEL 2 NORMAL VI SOLN: 2 refills | Status: AC

## 2020-03-15 MED ORDER — TRUE METRIX METER W/DEVICE KIT: PACK | 0 refills | Status: AC

## 2020-03-15 NOTE — Progress Notes (Signed)
  Chronic Care Management   Outreach Note - Initial Visit F/U   Name: Jennifer Saunders MRN: [id number] DOB: 1950-12-24  Referred by: Brunetta Jeans, PA-C Goals    . PharmD Care Plan     CARE PLAN ENTRY (see longitudinal plan of care for additional care plan information)  Current Barriers:  . Chronic Disease Management support, education, and care coordination needs related to Hypertension, Hyperlipidemia, and Diabetes   Hypertension BP Readings from Last 3 Encounters:  11/23/19 120/76  06/13/19 136/71  03/17/19 138/84   . Pharmacist Clinical Goal(s): o Over the next 180 days, patient will work with PharmD and providers to maintain BP goal <130/80 . Current regimen:  . Losartan-HCTZ 100-25 once daily . Amlodipine 5 mg once daily . Carvedilol 12.5 mg twice daily with meal . Interventions: o Diet/exercise recommendations . Patient self care activities - Over the next 180 days, patient will: o Check BP at least once daily, document, and provide at future appointments o Ensure daily salt intake < 2300 mg/day  Diabetes Lab Results  Component Value Date/Time   HGBA1C 6.5 11/23/2019 11:10 AM   HGBA1C 6.5 (A) 11/13/2018 08:18 AM   HGBA1C 6.5 05/06/2018 10:19 AM   . Pharmacist Clinical Goal(s): o Over the next 180 days, patient will work with PharmD and providers to maintain A1c goal <7% . Current regimen:  o Metformin XR 500 mg twice daily . Interventions: o BG Monitor Coverage Review - Rx Request . Patient self care activities - Over the next 180 days, patient will: o Check blood sugar as directed, document, and provide at future appointments o Contact provider with any episodes of hypoglycemia  Medication management . Pharmacist Clinical Goal(s): o Over the next 180 days, patient will work with PharmD and providers to maintain optimal medication adherence . Current pharmacy: [facility] . Interventions o Comprehensive medication review  performed. o Continue current medication management strategy . Patient self care activities - Over the next 180 days, patient will: o Take medications as prescribed o Report any questions or concerns to PharmD and/or provider(s) Please see past updates related to this goal by clicking on the "Past Updates" button in the selected goal .    . Weight (lb) < 215 lb (97.5 kg)     Lose weight decreasing caloric intake.       Reviewed cost of testing supplies through insurance, confirmed that pt is okay with price and getting through [facility] order pharmacy.   Requesting for PCP to send following supplies to [facility]:  Truemetrix meter ($1.39)  [employer] Truemetrix strips (<1$ #50)  Truplus Lancets 33G (<$1)  1 bottle of Truemetrix control solution ($0.39)  BD alcohol swabs (<$1 for #100)  Madelin Rear, Pharm.D., BCGP Clinical Pharmacist [facility] [phone]

## 2020-03-15 NOTE — Patient Instructions (Signed)
Please review care plan below and call me at [phone] (direct line) with any questions!  Thank you, Edyth Gunnels., Clinical Pharmacist  Goals Addressed            This Visit's Progress   . PharmD Care Plan   On track    CARE PLAN ENTRY (see longitudinal plan of care for additional care plan information)  Current Barriers:  . Chronic Disease Management support, education, and care coordination needs related to Hypertension, Hyperlipidemia, and Diabetes   Hypertension BP Readings from Last 3 Encounters:  11/23/19 120/76  06/13/19 136/71  03/17/19 138/84   . Pharmacist Clinical Goal(s): o Over the next 180 days, patient will work with PharmD and providers to maintain BP goal <130/80 . Current regimen:  . Losartan-HCTZ 100-25 once daily . Amlodipine 5 mg once daily . Carvedilol 12.5 mg twice daily with meal . Interventions: o Diet/exercise recommendations . Patient self care activities - Over the next 180 days, patient will: o Check BP at least once daily, document, and provide at future appointments o Ensure daily salt intake < 2300 mg/day  Diabetes Lab Results  Component Value Date/Time   HGBA1C 6.5 11/23/2019 11:10 AM   HGBA1C 6.5 (A) 11/13/2018 08:18 AM   HGBA1C 6.5 05/06/2018 10:19 AM   . Pharmacist Clinical Goal(s): o Over the next 180 days, patient will work with PharmD and providers to maintain A1c goal <7% . Current regimen:  o Metformin XR 500 mg twice daily . Interventions: o BG Monitor Coverage Review - Rx Request . Patient self care activities - Over the next 180 days, patient will: o Check blood sugar as directed, document, and provide at future appointments o Contact provider with any episodes of hypoglycemia  Medication management . Pharmacist Clinical Goal(s): o Over the next 180 days, patient will work with PharmD and providers to maintain optimal medication adherence . Current pharmacy: [facility] . Interventions o Comprehensive  medication review performed. o Continue current medication management strategy . Patient self care activities - Over the next 180 days, patient will: o Take medications as prescribed o Report any questions or concerns to PharmD and/or provider(s) Please see past updates related to this goal by clicking on the "Past Updates" button in the selected goal .       The patient verbalized understanding of instructions provided today and agreed to receive a mailed copy of patient instruction and/or educational materials. Telephone follow up appointment with pharmacy team member scheduled for: See next appointment with "Care Management Staff" under "What's Next" below.   Madelin Rear, Pharm.D., BCGP Clinical Pharmacist [facility] [phone]

## 2020-03-19 ENCOUNTER — Other Ambulatory Visit: Payer: Self-pay | Admitting: Physician Assistant

## 2020-03-19 DIAGNOSIS — I10 Essential (primary) hypertension: Secondary | ICD-10-CM

## 2020-03-25 NOTE — Progress Notes (Signed)
Subjective:   Jennifer Saunders is a 69 y.o. female who presents for Medicare Annual (Subsequent) preventive examination.   I connected with [name] today by telephone and verified that I am speaking with the correct person using two identifiers. Location patient: home Location provider: work Persons participating in the virtual visit: patient, Marine scientist.    I discussed the limitations, risks, security and privacy concerns of performing an evaluation and management service by telephone and the availability of in person appointments. I also discussed with the patient that there may be a patient responsible charge related to this service. The patient expressed understanding and verbally consented to this telephonic visit.    Interactive audio and video telecommunications were attempted between this provider and patient, however failed, due to patient having technical difficulties OR patient did not have access to video capability.  We continued and completed visit with audio only.  Some vital signs may be absent or patient reported.   Time Spent with patient on telephone encounter: 25 minutes   Review of Systems     Cardiac Risk Factors include: advanced age (>72mn, >>63women);diabetes mellitus;hypertension;dyslipidemia;obesity (BMI >30kg/m2);sedentary lifestyle     Objective:    Today's Vitals   03/28/20 1501  Weight: 218 lb (98.9 kg)  Height: 5' 3"  (1.6 m)   Body mass index is 38.62 kg/m.  Advanced Directives 03/28/2020 06/13/2019 03/17/2019 03/12/2018 10/22/2016 04/10/2016 01/17/2015  Does Patient Have a Medical Advance Directive? Yes No Yes Yes No No No  Type of Advance Directive Living will - Living will;Healthcare Power of Attorney Living will - - -  Copy of [place]? - - No - copy requested - - - -    Current Medications (verified) Outpatient Encounter Medications as of 03/28/2020  Medication Sig  . Alcohol Swabs PADS Use daily before blood  glucose  . amLODipine (NORVASC) 5 MG tablet TAKE 1 TABLET EVERY DAY  . aspirin 81 MG tablet Take 81 mg by mouth daily.    . Blood Glucose Calibration (TRUE METRIX LEVEL 2) Normal SOLN Use daily to check blood glucose controls Dx: E11.9  . Blood Glucose Monitoring Suppl (TRUE METRIX METER) w/Device KIT Check blood glucose once daily Dx: E11.9  . carvedilol (COREG) 12.5 MG tablet TAKE 1 TABLET TWICE DAILY WITH A MEAL  . clotrimazole-betamethasone (LOTRISONE) cream Apply 1 application topically 2 (two) times daily. For up to 2 weeks.  .Marland KitchenFLUZONE HIGH-DOSE QUADRIVALENT 0.7 ML SUSY   . glucose blood (TRUE METRIX BLOOD GLUCOSE TEST) test strip Check blood glucose daily. Dx: E11.9  . losartan-hydrochlorothiazide (HYZAAR) 100-25 MG tablet Take 1 tablet by mouth daily.  . metFORMIN (GLUCOPHAGE-XR) 500 MG 24 hr tablet TAKE 2 TABLETS BY MOUTH TWICE A DAY  . TRUEplus Lancets 33G MISC Check blood glucose daily Dx: E11.9   No facility-administered encounter medications on file as of 03/28/2020.    Allergies (verified) Patient has no known allergies.   History: Past Medical History:  Diagnosis Date  . Adenomatous colon polyp 2003  . Allergy    seasonal  . Anxiety   . Diabetes mellitus   . Dyslipidemia   . GERD (gastroesophageal reflux disease)   . Hemorrhoids    hx of  . Hypertension   . Urolithiasis    Past Surgical History:  Procedure Laterality Date  . COLONOSCOPY    . no prior surgery     Family History  Problem Relation Age of Onset  . Cancer Sister  Colon  . Intracerebral hemorrhage Sister   . Colon cancer Sister 42  . Cancer Mother        Breast  . Heart disease Mother        CVA x 2  . Breast cancer Mother 58  . Diabetes Maternal Aunt   . Diabetes Paternal Aunt   . Aneurysm Sister   . Hypertension Sister   . Heart disease Sister    Social History   Socioeconomic History  . Marital status: Divorced    Spouse name: Not on file  . Number of children: Not on  file  . Years of education: Not on file  . Highest education level: Not on file  Occupational History  . Occupation: Retired  Tobacco Use  . Smoking status: Never Smoker  . Smokeless tobacco: Never Used  Vaping Use  . Vaping Use: Never used  Substance and Sexual Activity  . Alcohol use: No    Alcohol/week: 0.0 standard drinks  . Drug use: No  . Sexual activity: Not Currently  Other Topics Concern  . Not on file  Social History Narrative  . Not on file   Social Determinants of Health   Financial Resource Strain: Low Risk   . Difficulty of Paying Living Expenses: Not hard at all  Food Insecurity: No Food Insecurity  . Worried About Charity fundraiser in the Last Year: Never true  . Ran Out of Food in the Last Year: Never true  Transportation Needs: No Transportation Needs  . Lack of Transportation (Medical): No  . Lack of Transportation (Non-Medical): No  Physical Activity: Inactive  . Days of Exercise per Week: 0 days  . Minutes of Exercise per Session: 0 min  Stress: No Stress Concern Present  . Feeling of Stress : Not at all  Social Connections: Moderately Isolated  . Frequency of Communication with Friends and Family: More than three times a week  . Frequency of Social Gatherings with Friends and Family: More than three times a week  . Attends Religious Services: 1 to 4 times per year  . Active Member of Clubs or Organizations: No  . Attends Archivist Meetings: Never  . Marital Status: Divorced    Tobacco Counseling Counseling given: Not Answered   Clinical Intake:  Pre-visit preparation completed: Yes  Pain : No/denies pain     Nutritional Status: BMI > 30  Obese Nutritional Risks: None Diabetes: Yes CBG done?: No Did pt. bring in CBG monitor from home?: No (phone visit)  How often do you need to have someone help you when you read instructions, pamphlets, or other written materials from your doctor or pharmacy?: 1 - Never What is the last  grade level you completed in school?: 12th grade  Diabetes:  Is the patient diabetic?  Yes  If diabetic, was a CBG obtained today?  No  Did the patient bring in their glucometer from home?  No phone visit How often do you monitor your CBG's? Occasionally.   Financial Strains and Diabetes Management:  Are you having any financial strains with the device, your supplies or your medication? No .  Does the patient want to be seen by Chronic Care Management for management of their diabetes?  No  Would the patient like to be referred to a Nutritionist or for Diabetic Management?  No   Diabetic Exam:  Diabetic Eye Exam: Completed 11/20/2019.   Diabetic Foot Exam: Completed 11/23/2019.     Interpreter Needed?: No  Information entered by :: Caroleen Hamman LPN   Activities of Daily Living In your present state of health, do you have any difficulty performing the following activities: 03/28/2020  Hearing? N  Vision? N  Difficulty concentrating or making decisions? N  Walking or climbing stairs? N  Dressing or bathing? N  Doing errands, shopping? N  Preparing Food and eating ? N  Using the Toilet? N  In the past six months, have you accidently leaked urine? N  Do you have problems with loss of bowel control? N  Managing your Medications? N  Managing your Finances? N  Housekeeping or managing your Housekeeping? N  Some recent data might be hidden    Patient Care Team: Delorse Limber as PCP - General (Family Medicine) Renato Shin, MD as Consulting Physician (Endocrinology) Thelma Comp, [place] (Optometry) Madelin Rear, [facility] as Pharmacist (Pharmacist)  Indicate any recent Medical Services you may have received from other than [facility] providers in the past year (date may be approximate).     Assessment:   This is a routine wellness examination for Jennifer Saunders.  Hearing/Vision screen  Hearing Screening   125Hz  250Hz  500Hz  1000Hz  2000Hz  3000Hz  4000Hz  6000Hz  8000Hz   Right  ear:           Left ear:           Comments: No issues  Vision Screening Comments: Wears reading glasses only   Dietary issues and exercise activities discussed: Current Exercise Habits: The patient does not participate in regular exercise at present, Exercise limited by: None identified  Goals    . Patient Stated     Increase activity & eat more fruits & vegetable    . PharmD Care Plan     CARE PLAN ENTRY (see longitudinal plan of care for additional care plan information)  Current Barriers:  . Chronic Disease Management support, education, and care coordination needs related to Hypertension, Hyperlipidemia, and Diabetes   Hypertension BP Readings from Last 3 Encounters:  11/23/19 120/76  06/13/19 136/71  03/17/19 138/84   . Pharmacist Clinical Goal(s): o Over the next 180 days, patient will work with PharmD and providers to maintain BP goal <130/80 . Current regimen:  . Losartan-HCTZ 100-25 once daily . Amlodipine 5 mg once daily . Carvedilol 12.5 mg twice daily with meal . Interventions: o Diet/exercise recommendations . Patient self care activities - Over the next 180 days, patient will: o Check BP at least once daily, document, and provide at future appointments o Ensure daily salt intake < 2300 mg/day  Diabetes Lab Results  Component Value Date/Time   HGBA1C 6.5 11/23/2019 11:10 AM   HGBA1C 6.5 (A) 11/13/2018 08:18 AM   HGBA1C 6.5 05/06/2018 10:19 AM   . Pharmacist Clinical Goal(s): o Over the next 180 days, patient will work with PharmD and providers to maintain A1c goal <7% . Current regimen:  o Metformin XR 500 mg twice daily . Interventions: o BG Monitor Coverage Review - Rx Request . Patient self care activities - Over the next 180 days, patient will: o Check blood sugar as directed, document, and provide at future appointments o Contact provider with any episodes of hypoglycemia  Medication management . Pharmacist Clinical Goal(s): o Over the next  180 days, patient will work with PharmD and providers to maintain optimal medication adherence . Current pharmacy: [facility] . Interventions o Comprehensive medication review performed. o Continue current medication management strategy . Patient self care activities - Over the next 180  days, patient will: o Take medications as prescribed o Report any questions or concerns to PharmD and/or provider(s) Please see past updates related to this goal by clicking on the "Past Updates" button in the selected goal .      Depression Screen PHQ 2/9 Scores 03/28/2020 03/17/2019 05/06/2018 03/12/2018 11/04/2017 05/30/2016 05/30/2016  PHQ - 2 Score 0 0 0 0 0 0 0  PHQ- 9 Score - - 0 - - - 0    Fall Risk Fall Risk  03/28/2020 03/17/2019 03/17/2019 05/06/2018 03/12/2018  Falls in the past year? 1 0 0 0 0  Number falls in past yr: 0 0 0 0 -  Injury with Fall? 0 - 0 0 -  Follow up Falls prevention discussed - Falls evaluation completed Falls evaluation completed -    Any stairs in or around the home? No  Home free of loose throw rugs in walkways, pet beds, electrical cords, etc? Yes  Adequate lighting in your home to reduce risk of falls? Yes   ASSISTIVE DEVICES UTILIZED TO PREVENT FALLS:  Life alert? No  Use of a cane, walker or w/c? No  Grab bars in the bathroom? Yes  Shower chair or bench in shower? No  Elevated toilet seat or a handicapped toilet? No   TIMED UP AND GO:  Was the test performed? No .phone visit    Cognitive Function:No cognitive impairment noted MMSE - Mini Mental State Exam 03/17/2019 03/12/2018  Orientation to time 5 5  Orientation to Place 5 5  Registration 3 3  Attention/ Calculation 5 5  Recall 2 0  Language- name 2 objects 2 2  Language- repeat 1 1  Language- follow 3 step command 3 3  Language- read & follow direction 1 1  Write a sentence 1 1  Copy design 1 1  Total score 29 27        Immunizations Immunization History  Administered  Date(s) Administered  . Influenza, High Dose Seasonal PF 02/25/2014, 03/03/2018, 02/05/2019  . Influenza,inj,Quad PF,6+ Mos 03/07/2017  . Influenza-Unspecified 01/17/2015, 01/29/2016, 01/29/2020  . PFIZER SARS-COV-2 Vaccination 07/11/2019, 08/04/2019  . Pneumococcal Conjugate-13 01/15/2014  . Pneumococcal Polysaccharide-23 05/24/2009, 05/30/2016  . Td 08/28/1997  . Tdap 01/15/2014    TDAP status: Up to date   Flu Vaccine status: Up to date   Pneumococcal vaccine status: Up to date   Covid-19 vaccine status: Completed vaccines  Qualifies for Shingles Vaccine? Yes   Zostavax completed No   Shingrix Completed?: No.    Education has been provided regarding the importance of this vaccine. Patient has been advised to call insurance company to determine out of pocket expense if they have not yet received this vaccine. Advised may also receive vaccine at local pharmacy or Health Dept. Verbalized acceptance and understanding.  Screening Tests Health Maintenance  Topic Date Due  . DTAP VACCINES (1) 01/08/1951  . HEMOGLOBIN A1C  05/25/2020  . OPHTHALMOLOGY EXAM  11/19/2020  . FOOT EXAM  11/22/2020  . MAMMOGRAM  03/30/2021  . COLONOSCOPY  11/14/2022  . DTaP/Tdap/Td (3 - Td or Tdap) 01/16/2024  . TETANUS/TDAP  01/16/2024  . INFLUENZA VACCINE  Completed  . DEXA SCAN  Completed  . COVID-19 Vaccine  Completed  . Hepatitis C Screening  Completed  . PNA vac Low Risk Adult  Completed    Health Maintenance  Health Maintenance Due  Topic Date Due  . DTAP VACCINES (1) 01/08/1951    Colorectal cancer screening: Completed Colonoscopy 11/13/2017. Repeat every 5  years   Mammogram status: Completed Bilateral-03/31/2019. Repeat every year  Scheduled for 04/19/2020  Bone Density status: Ordered today. Pt provided with contact info and advised to call to schedule appt.  Lung Cancer Screening: (Low Dose CT Chest recommended if Age 21-80 years, 30 pack-year currently smoking OR have quit w/in  15years.) does not qualify.  Additional Screening:  Hepatitis C Screening:  Completed 04/10/2016  Vision Screening: Recommended annual ophthalmology exams for early detection of glaucoma and other disorders of the eye. Is the patient up to date with their annual eye exam?  Yes  Who is the provider or what is the name of the office in which the patient attends annual eye exams? Unsure of name  Dental Screening: Recommended annual dental exams for proper oral hygiene  Community Resource Referral / Chronic Care Management: CRR required this visit?  No   CCM required this visit?  No      Plan:     I have personally reviewed and noted the following in the patient's chart:   . Medical and social history . Use of alcohol, tobacco or illicit drugs  . Current medications and supplements . Functional ability and status . Nutritional status . Physical activity . Advanced directives . List of other physicians . Hospitalizations, surgeries, and ER visits in previous 12 months . Vitals . Screenings to include cognitive, depression, and falls . Referrals and appointments  In addition, I have reviewed and discussed with patient certain preventive protocols, quality metrics, and best practice recommendations. A written personalized care plan for preventive services as well as general preventive health recommendations were provided to patient.   Due to this being a telephonic visit, the after visit summary with patients personalized plan was offered to patient via mail or my-chart.  Patient would like to access on my-chart.   Marta Antu, LPN   [id number]/41/[id number]  Nurse Health Advisor  Nurse Notes: None

## 2020-03-28 ENCOUNTER — Ambulatory Visit (INDEPENDENT_AMBULATORY_CARE_PROVIDER_SITE_OTHER): Payer: Medicare HMO

## 2020-03-28 ENCOUNTER — Other Ambulatory Visit: Payer: Self-pay

## 2020-03-28 VITALS — Ht 63.0 in | Wt 218.0 lb

## 2020-03-28 DIAGNOSIS — Z0000 Encounter for general adult medical examination without abnormal findings: Secondary | ICD-10-CM | POA: Diagnosis not present

## 2020-03-28 DIAGNOSIS — Z780 Asymptomatic menopausal state: Secondary | ICD-10-CM | POA: Diagnosis not present

## 2020-03-28 NOTE — Patient Instructions (Signed)
Jennifer Saunders , Thank you for taking time to complete your Medicare Wellness Visit. I appreciate your ongoing commitment to your health goals. Please review the following plan we discussed and let me know if I can assist you in the future.   Screening recommendations/referrals: Colonoscopy: Completed 11/13/2017-Due 11/14/2022 Mammogram: Scheduled for 04/19/2020 Bone Density: Ordered today. Someone will be calling you to schedule Recommended yearly ophthalmology/optometry visit for glaucoma screening and checkup Recommended yearly dental visit for hygiene and checkup  Vaccinations: Influenza vaccine: Up to date Pneumococcal vaccine: Completed vaccines Tdap vaccine: Up to date- Due-01/16/2024 Shingles vaccine: Discuss with pharmacy  Covid-19:Completed vaccines  Advanced directives: Please bring a copy for your chart  Conditions/risks identified: See problem list  Next appointment: Follow up in one year for your annual wellness visit 04/03/2021 @ 3:00pm   Preventive Care 71 Years and Older, Female Preventive care refers to lifestyle choices and visits with your health care provider that can promote health and wellness. What does preventive care include?  A yearly physical exam. This is also called an annual well check.  Dental exams once or twice a year.  Routine eye exams. Ask your health care provider how often you should have your eyes checked.  Personal lifestyle choices, including:  Daily care of your teeth and gums.  Regular physical activity.  Eating a healthy diet.  Avoiding tobacco and drug use.  Limiting alcohol use.  Practicing safe sex.  Taking low-dose aspirin every day.  Taking vitamin and mineral supplements as recommended by your health care provider. What happens during an annual well check? The services and screenings done by your health care provider during your annual well check will depend on your age, overall health, lifestyle risk factors, and family  history of disease. Counseling  Your health care provider may ask you questions about your:  Alcohol use.  Tobacco use.  Drug use.  Emotional well-being.  Home and relationship well-being.  Sexual activity.  Eating habits.  History of falls.  Memory and ability to understand (cognition).  Work and work Statistician.  Reproductive health. Screening  You may have the following tests or measurements:  Height, weight, and BMI.  Blood pressure.  Lipid and cholesterol levels. These may be checked every 5 years, or more frequently if you are over 44 years old.  Skin check.  Lung cancer screening. You may have this screening every year starting at age 42 if you have a 30-pack-year history of smoking and currently smoke or have quit within the past 15 years.  Fecal occult blood test (FOBT) of the stool. You may have this test every year starting at age 49.  Flexible sigmoidoscopy or colonoscopy. You may have a sigmoidoscopy every 5 years or a colonoscopy every 10 years starting at age 35.  Hepatitis C blood test.  Hepatitis B blood test.  Sexually transmitted disease (STD) testing.  Diabetes screening. This is done by checking your blood sugar (glucose) after you have not eaten for a while (fasting). You may have this done every 1-3 years.  Bone density scan. This is done to screen for osteoporosis. You may have this done starting at age 76.  Mammogram. This may be done every 1-2 years. Talk to your health care provider about how often you should have regular mammograms. Talk with your health care provider about your test results, treatment options, and if necessary, the need for more tests. Vaccines  Your health care provider may recommend certain vaccines, such as:  Influenza vaccine.  This is recommended every year.  Tetanus, diphtheria, and acellular pertussis (Tdap, Td) vaccine. You may need a Td booster every 10 years.  Zoster vaccine. You may need this after  age 40.  Pneumococcal 13-valent conjugate (PCV13) vaccine. One dose is recommended after age 100.  Pneumococcal polysaccharide (PPSV23) vaccine. One dose is recommended after age 67. Talk to your health care provider about which screenings and vaccines you need and how often you need them. This information is not intended to replace advice given to you by your health care provider. Make sure you discuss any questions you have with your health care provider. Document Released: 05/13/2015 Document Revised: 01/04/2016 Document Reviewed: 02/15/2015 Elsevier Interactive Patient Education  2017 [employer] Prevention in the Home Falls can cause injuries. They can happen to people of all ages. There are many things you can do to make your home safe and to help prevent falls. What can I do on the outside of my home?  Regularly fix the edges of walkways and driveways and fix any cracks.  Remove anything that might make you trip as you walk through a door, such as a raised step or threshold.  Trim any bushes or trees on the path to your home.  Use bright outdoor lighting.  Clear any walking paths of anything that might make someone trip, such as rocks or tools.  Regularly check to see if handrails are loose or broken. Make sure that both sides of any steps have handrails.  Any raised decks and porches should have guardrails on the edges.  Have any leaves, snow, or ice cleared regularly.  Use sand or salt on walking paths during winter.  Clean up any spills in your garage right away. This includes oil or grease spills. What can I do in the bathroom?  Use night lights.  Install grab bars by the toilet and in the tub and shower. Do not use towel bars as grab bars.  Use non-skid mats or decals in the tub or shower.  If you need to sit down in the shower, use a plastic, non-slip stool.  Keep the floor dry. Clean up any water that spills on the floor as soon as it  happens.  Remove soap buildup in the tub or shower regularly.  Attach bath mats securely with double-sided non-slip rug tape.  Do not have throw rugs and other things on the floor that can make you trip. What can I do in the bedroom?  Use night lights.  Make sure that you have a light by your bed that is easy to reach.  Do not use any sheets or blankets that are too big for your bed. They should not hang down onto the floor.  Have a firm chair that has side arms. You can use this for support while you get dressed.  Do not have throw rugs and other things on the floor that can make you trip. What can I do in the kitchen?  Clean up any spills right away.  Avoid walking on wet floors.  Keep items that you use a lot in easy-to-reach places.  If you need to reach something above you, use a strong step stool that has a grab bar.  Keep electrical cords out of the way.  Do not use floor polish or wax that makes floors slippery. If you must use wax, use non-skid floor wax.  Do not have throw rugs and other things on the floor that can make  you trip. What can I do with my stairs?  Do not leave any items on the stairs.  Make sure that there are handrails on both sides of the stairs and use them. Fix handrails that are broken or loose. Make sure that handrails are as long as the stairways.  Check any carpeting to make sure that it is firmly attached to the stairs. Fix any carpet that is loose or worn.  Avoid having throw rugs at the top or bottom of the stairs. If you do have throw rugs, attach them to the floor with carpet tape.  Make sure that you have a light switch at the top of the stairs and the bottom of the stairs. If you do not have them, ask someone to add them for you. What else can I do to help prevent falls?  Wear shoes that:  Do not have high heels.  Have rubber bottoms.  Are comfortable and fit you well.  Are closed at the toe. Do not wear sandals.  If you  use a stepladder:  Make sure that it is fully opened. Do not climb a closed stepladder.  Make sure that both sides of the stepladder are locked into place.  Ask someone to hold it for you, if possible.  Clearly mark and make sure that you can see:  Any grab bars or handrails.  First and last steps.  Where the edge of each step is.  Use tools that help you move around (mobility aids) if they are needed. These include:  Canes.  Walkers.  Scooters.  Crutches.  Turn on the lights when you go into a dark area. Replace any light bulbs as soon as they burn out.  Set up your furniture so you have a clear path. Avoid moving your furniture around.  If any of your floors are uneven, fix them.  If there are any pets around you, be aware of where they are.  Review your medicines with your doctor. Some medicines can make you feel dizzy. This can increase your chance of falling. Ask your doctor what other things that you can do to help prevent falls. This information is not intended to replace advice given to you by your health care provider. Make sure you discuss any questions you have with your health care provider. Document Released: 02/10/2009 Document Revised: 09/22/2015 Document Reviewed: 05/21/2014 Elsevier Interactive Patient Education  2017 [employer].

## 2020-04-19 ENCOUNTER — Ambulatory Visit
Admission: RE | Admit: 2020-04-19 | Discharge: 2020-04-19 | Disposition: A | Discharge status: Home / Self Care | Payer: Medicare HMO | Source: Ambulatory Visit | Attending: Physician Assistant | Admitting: Physician Assistant

## 2020-04-19 ENCOUNTER — Other Ambulatory Visit: Payer: Self-pay

## 2020-04-19 DIAGNOSIS — Z1231 Encounter for screening mammogram for malignant neoplasm of breast: Secondary | ICD-10-CM

## 2020-05-20 ENCOUNTER — Other Ambulatory Visit: Payer: Self-pay

## 2020-05-20 ENCOUNTER — Ambulatory Visit
Admission: RE | Admit: 2020-05-20 | Discharge: 2020-05-20 | Disposition: A | Discharge status: Home / Self Care | Payer: Medicare HMO | Source: Ambulatory Visit | Attending: Physician Assistant | Admitting: Physician Assistant

## 2020-05-20 DIAGNOSIS — Z780 Asymptomatic menopausal state: Secondary | ICD-10-CM | POA: Diagnosis not present

## 2020-05-20 DIAGNOSIS — M8589 Other specified disorders of bone density and structure, multiple sites: Secondary | ICD-10-CM | POA: Diagnosis not present

## 2020-05-24 ENCOUNTER — Other Ambulatory Visit: Payer: Self-pay | Admitting: Physician Assistant

## 2020-05-25 ENCOUNTER — Other Ambulatory Visit: Payer: Self-pay | Admitting: Emergency Medicine

## 2020-05-25 DIAGNOSIS — M8588 Other specified disorders of bone density and structure, other site: Secondary | ICD-10-CM

## 2020-05-25 MED ORDER — CALCIUM 1200 1200-1000 MG-UNIT PO CHEW: 1.0000 | CHEWABLE_TABLET | Freq: Every day | ORAL | 0 refills | Status: DC

## 2020-05-30 ENCOUNTER — Telehealth: Payer: Self-pay

## 2020-05-30 NOTE — Progress Notes (Unsigned)
0   Chronic Care Management Pharmacy Assistant   Name: Jennifer Saunders  MRN: [id number] DOB: 02-23-1951  Reason for Encounter: Disease State  PCP : Brunetta Jeans, PA-C  Allergies:  No Known Allergies  Medications: Outpatient Encounter Medications as of 05/30/2020  Medication Sig  . Alcohol Swabs PADS Use daily before blood glucose  . amLODipine (NORVASC) 5 MG tablet TAKE 1 TABLET EVERY DAY  . aspirin 81 MG tablet Take 81 mg by mouth daily.    . Blood Glucose Calibration (TRUE METRIX LEVEL 2) Normal SOLN Use daily to check blood glucose controls Dx: E11.9  . Blood Glucose Monitoring Suppl (TRUE METRIX METER) w/Device KIT Check blood glucose once daily Dx: E11.9  . Calcium Carbonate-Vit D-Min (CALCIUM 1200) 1200-1000 MG-UNIT CHEW Chew 1 tablet by mouth daily.  . carvedilol (COREG) 12.5 MG tablet TAKE 1 TABLET TWICE DAILY WITH A MEAL  . clotrimazole-betamethasone (LOTRISONE) cream Apply 1 application topically 2 (two) times daily. For up to 2 weeks.  Marland Kitchen FLUZONE HIGH-DOSE QUADRIVALENT 0.7 ML SUSY   . glucose blood (TRUE METRIX BLOOD GLUCOSE TEST) test strip Check blood glucose daily. Dx: E11.9  . losartan-hydrochlorothiazide (HYZAAR) 100-25 MG tablet TAKE 1 TABLET EVERY DAY  . metFORMIN (GLUCOPHAGE-XR) 500 MG 24 hr tablet TAKE 2 TABLETS BY MOUTH TWICE A DAY  . TRUEplus Lancets 33G MISC Check blood glucose daily Dx: E11.9   No facility-administered encounter medications on file as of 05/30/2020.    Current Diagnosis: Patient Active Problem List   Diagnosis Date Noted  . Visit for preventive health examination 05/06/2018  . Liver disorder 10/22/2016  . Need for 23-polyvalent pneumococcal polysaccharide vaccine 06/02/2016  . Neck pain on right side 06/02/2016  . NASH (nonalcoholic steatohepatitis) 04/11/2016  . History of adenomatous polyp of colon 10/17/2015  . Morbid obesity ([place]) 10/17/2015  . GERD (gastroesophageal reflux disease) 08/15/2015  . Low TSH level 01/15/2014  .  Menopausal state 12/17/2012  . Undiagnosed cardiac murmurs 12/17/2012  . Varicose veins 08/18/2010  . TIA 05/17/2008  . HYPERCHOLESTEROLEMIA 03/18/2008  . URINARY CALCULUS 03/18/2008  . HERPES ZOSTER WITHOUT MENTION OF COMPLICATION 03/55/9741  . Diabetes ([place]) 12/28/2006  . ANXIETY 12/28/2006  . Essential hypertension 12/28/2006    Have you had any problems recently with your health?   Have you had any problems with your pharmacy?  What issues or side effects are you having with your medications?  What would you like me to pass along to Edison Nasuti Potts,CPP for them to help you with?   What can we do to take care of you better?   Georgiana Shore ,[place] Pharmacist Assistant [phone]   Follow-Up:  Pharmacist Review

## 2020-06-06 ENCOUNTER — Telehealth: Payer: Self-pay

## 2020-06-06 NOTE — Chronic Care Management (AMB) (Signed)
Chronic Care Management Pharmacy Assistant   Name: Jennifer Saunders  MRN: [id number] DOB: 1951/01/18  Reason for Encounter: Disease State/ Hypertension Adherence Call  Patient Questions:  1.  Have you seen any other providers since your last visit? No, patient has not seen any other providers since her last visit with Madelin Rear, CPP.  2.  Any changes in your medicines or health? Patient states she recently started taking Calcium Carbonate-Vit-D-Min (CALCIUM 1200) 1200-1000 mg-unit-chew. Patient states she has not had any changes in her health.   PCP : Brunetta Jeans, PA-C  Allergies:  No Known Allergies  Medications: Outpatient Encounter Medications as of 06/06/2020  Medication Sig  . Alcohol Swabs PADS Use daily before blood glucose  . amLODipine (NORVASC) 5 MG tablet TAKE 1 TABLET EVERY DAY  . aspirin 81 MG tablet Take 81 mg by mouth daily.    . Blood Glucose Calibration (TRUE METRIX LEVEL 2) Normal SOLN Use daily to check blood glucose controls Dx: E11.9  . Blood Glucose Monitoring Suppl (TRUE METRIX METER) w/Device KIT Check blood glucose once daily Dx: E11.9  . Calcium Carbonate-Vit D-Min (CALCIUM 1200) 1200-1000 MG-UNIT CHEW Chew 1 tablet by mouth daily.  . carvedilol (COREG) 12.5 MG tablet TAKE 1 TABLET TWICE DAILY WITH A MEAL  . clotrimazole-betamethasone (LOTRISONE) cream Apply 1 application topically 2 (two) times daily. For up to 2 weeks.  Marland Kitchen FLUZONE HIGH-DOSE QUADRIVALENT 0.7 ML SUSY   . glucose blood (TRUE METRIX BLOOD GLUCOSE TEST) test strip Check blood glucose daily. Dx: E11.9  . losartan-hydrochlorothiazide (HYZAAR) 100-25 MG tablet TAKE 1 TABLET EVERY DAY  . metFORMIN (GLUCOPHAGE-XR) 500 MG 24 hr tablet TAKE 2 TABLETS BY MOUTH TWICE A DAY  . TRUEplus Lancets 33G MISC Check blood glucose daily Dx: E11.9   No facility-administered encounter medications on file as of 06/06/2020.    Current Diagnosis: Patient Active Problem List   Diagnosis Date Noted  . Visit  for preventive health examination 05/06/2018  . Liver disorder 10/22/2016  . Need for 23-polyvalent pneumococcal polysaccharide vaccine 06/02/2016  . Neck pain on right side 06/02/2016  . NASH (nonalcoholic steatohepatitis) 04/11/2016  . History of adenomatous polyp of colon 10/17/2015  . Morbid obesity ([place]) 10/17/2015  . GERD (gastroesophageal reflux disease) 08/15/2015  . Low TSH level 01/15/2014  . Menopausal state 12/17/2012  . Undiagnosed cardiac murmurs 12/17/2012  . Varicose veins 08/18/2010  . TIA 05/17/2008  . HYPERCHOLESTEROLEMIA 03/18/2008  . URINARY CALCULUS 03/18/2008  . HERPES ZOSTER WITHOUT MENTION OF COMPLICATION 50/93/2671  . Diabetes ([place]) 12/28/2006  . ANXIETY 12/28/2006  . Essential hypertension 12/28/2006    Reviewed chart prior to disease state call. Spoke with patient regarding BP  Recent Office Vitals: BP Readings from Last 3 Encounters:  11/23/19 120/76  06/13/19 136/71  03/17/19 138/84   Pulse Readings from Last 3 Encounters:  11/23/19 76  06/13/19 77  03/17/19 86    Wt Readings from Last 3 Encounters:  03/28/20 218 lb (98.9 kg)  11/23/19 (!) 217 lb (98.4 kg)  06/13/19 220 lb (99.8 kg)     Kidney Function Lab Results  Component Value Date/Time   CREATININE 0.83 11/23/2019 11:10 AM   CREATININE 0.83 06/13/2019 10:02 AM   CREATININE 0.89 04/01/2019 09:51 AM   GFR 81.99 11/23/2019 11:10 AM   GFRNONAA >60 06/13/2019 10:02 AM   GFRAA >60 06/13/2019 10:02 AM    BMP Latest Ref Rng & Units 11/23/2019 06/13/2019 04/01/2019  Glucose 70 - 99 mg/dL  105(H) 117(H) 146(H)  BUN 6 - 23 mg/dL 13 14 18   Creatinine 0.40 - 1.20 mg/dL 0.83 0.83 0.89  BUN/Creat Ratio 6 - 22 (calc) - - NOT APPLICABLE  Sodium 737 - 145 mEq/L 138 136 139  Potassium 3.5 - 5.1 mEq/L 3.8 3.8 3.9  Chloride 96 - 112 mEq/L 101 101 103  CO2 19 - 32 mEq/L 31 26 25   Calcium 8.4 - 10.5 mg/dL 10.0 9.8 9.7    . Current antihypertensive regimen:  o Amlodipine 5 mg tablet  daily o Carvedilol 12.5 mg tablet twice daily o Losartan-hydrochlorothiazide 100-25 mg tablet daily  . How often are you checking your Blood Pressure? several times per month   . Current home BP readings: 130's/70's  . What recent interventions/DTPs have been made by any provider to improve Blood Pressure control since last CPP Visit: Patient states she is taking her medications as prescribed.  . Any recent hospitalizations or ED visits since last visit with CPP? No , patient has not had any recent hospitalizations or ED visits since her last visit with Madelin Rear, CPP.  Marland Kitchen What diet changes have been made to improve Blood Pressure Control?  o Patient states she tries to avoid using salt. Patient states she also avoids eating red meats. Patient states she eats a lot of lean meat such as chicken and fish.  . What exercise is being done to improve your Blood Pressure Control?  o Patient states she does not exercise at this time.  Adherence Review: Is the patient currently on ACE/ARB medication? Yes Does the patient have >5 day gap between last estimated fill dates? No  Patient states she don't check her blood sugars at home as she is scared of needles and don't like to stick herself.  Future Appointments  Date Time Provider [place]  09/09/2020  1:00 PM [facility]CM PHARMACIST [facility]  04/03/2021  3:00 PM [facility] HEALTH COACH [facility]    April D Calhoun, [place] Pharmacist Assistant [phone]  Follow-Up:  Pharmacist Review

## 2020-06-21 ENCOUNTER — Other Ambulatory Visit: Payer: Self-pay | Admitting: Physician Assistant

## 2020-06-24 ENCOUNTER — Telehealth: Payer: Self-pay

## 2020-06-24 NOTE — Chronic Care Management (AMB) (Signed)
Chronic Care Management Pharmacy Assistant   Name: Jennifer Saunders  MRN: [id number] DOB: 01/04/1951  Reason for Encounter: Chart Review  PCP : Martin, William C, PA-C  Allergies:  No Known Allergies  Medications: Outpatient Encounter Medications as of 06/24/2020  Medication Sig  . Alcohol Swabs PADS Use daily before blood glucose  . amLODipine (NORVASC) 5 MG tablet TAKE 1 TABLET EVERY DAY  . aspirin 81 MG tablet Take 81 mg by mouth daily.    . Blood Glucose Calibration (TRUE METRIX LEVEL 2) Normal SOLN Use daily to check blood glucose controls Dx: E11.9  . Blood Glucose Monitoring Suppl (TRUE METRIX METER) w/Device KIT Check blood glucose once daily Dx: E11.9  . Calcium Carbonate-Vit D-Min (CALCIUM 1200) 1200-1000 MG-UNIT CHEW Chew 1 tablet by mouth daily.  . carvedilol (COREG) 12.5 MG tablet TAKE 1 TABLET TWICE DAILY WITH A MEAL  . clotrimazole-betamethasone (LOTRISONE) cream Apply 1 application topically 2 (two) times daily. For up to 2 weeks.  . FLUZONE HIGH-DOSE QUADRIVALENT 0.7 ML SUSY   . glucose blood (TRUE METRIX BLOOD GLUCOSE TEST) test strip Check blood glucose daily. Dx: E11.9  . losartan-hydrochlorothiazide (HYZAAR) 100-25 MG tablet TAKE 1 TABLET EVERY DAY  . metFORMIN (GLUCOPHAGE-XR) 500 MG 24 hr tablet TAKE 2 TABLETS TWICE DAILY  . TRUEplus Lancets 33G MISC Check blood glucose daily Dx: E11.9   No facility-administered encounter medications on file as of 06/24/2020.    Current Diagnosis: Patient Active Problem List   Diagnosis Date Noted  . Visit for preventive health examination 05/06/2018  . Liver disorder 10/22/2016  . Need for 23-polyvalent pneumococcal polysaccharide vaccine 06/02/2016  . Neck pain on right side 06/02/2016  . NASH (nonalcoholic steatohepatitis) 04/11/2016  . History of adenomatous polyp of colon 10/17/2015  . Morbid obesity ([facility]) 10/17/2015  . GERD (gastroesophageal reflux disease) 08/15/2015  . Low TSH level 01/15/2014  . Menopausal  state 12/17/2012  . Undiagnosed cardiac murmurs 12/17/2012  . Varicose veins 08/18/2010  . TIA 05/17/2008  . HYPERCHOLESTEROLEMIA 03/18/2008  . URINARY CALCULUS 03/18/2008  . HERPES ZOSTER WITHOUT MENTION OF COMPLICATION 04/25/2007  . Diabetes ([facility]) 12/28/2006  . ANXIETY 12/28/2006  . Essential hypertension 12/28/2006    Reviewed chart for medication changes ahead of medication coordination call. No OVs, Consults, or hospital visits since last care coordination call/Pharmacist visit. No medication changes indicated.  Future Appointments  Date Time Provider Department Center  09/09/2020  1:00 PM [facility] PHARMACIST [facility]  04/03/2021  3:00 PM [facility] HEALTH COACH [facility]    April D Calhoun, CMA Clinical Pharmacist Assistant [phone]   Follow-Up:  Pharmacist Review 

## 2020-07-17 ENCOUNTER — Other Ambulatory Visit: Payer: Self-pay | Admitting: Physician Assistant

## 2020-08-19 ENCOUNTER — Other Ambulatory Visit: Payer: Self-pay

## 2020-08-19 ENCOUNTER — Encounter: Payer: Self-pay | Admitting: Family Medicine

## 2020-08-19 ENCOUNTER — Ambulatory Visit (INDEPENDENT_AMBULATORY_CARE_PROVIDER_SITE_OTHER): Payer: Medicare HMO | Admitting: Family Medicine

## 2020-08-19 VITALS — BP 122/84 | HR 81 | Temp 97.3°F | Ht 61.0 in | Wt 219.8 lb

## 2020-08-19 DIAGNOSIS — I10 Essential (primary) hypertension: Secondary | ICD-10-CM | POA: Diagnosis not present

## 2020-08-19 DIAGNOSIS — K7581 Nonalcoholic steatohepatitis (NASH): Secondary | ICD-10-CM

## 2020-08-19 DIAGNOSIS — M8580 Other specified disorders of bone density and structure, unspecified site: Secondary | ICD-10-CM | POA: Insufficient documentation

## 2020-08-19 DIAGNOSIS — R7989 Other specified abnormal findings of blood chemistry: Secondary | ICD-10-CM | POA: Diagnosis not present

## 2020-08-19 DIAGNOSIS — M85852 Other specified disorders of bone density and structure, left thigh: Secondary | ICD-10-CM

## 2020-08-19 DIAGNOSIS — U071 COVID-19: Secondary | ICD-10-CM | POA: Insufficient documentation

## 2020-08-19 DIAGNOSIS — E785 Hyperlipidemia, unspecified: Secondary | ICD-10-CM

## 2020-08-19 DIAGNOSIS — E119 Type 2 diabetes mellitus without complications: Secondary | ICD-10-CM | POA: Diagnosis not present

## 2020-08-19 MED ORDER — CARVEDILOL 12.5 MG PO TABS
12.5000 mg | ORAL_TABLET | Freq: Two times a day (BID) | ORAL | 3 refills | Status: DC
Start: 2020-08-19 — End: 2021-09-26

## 2020-08-19 NOTE — Progress Notes (Signed)
[facility] [address] [place] [address] Dept: [phone] Dept Fax: [phone]  Transfer of Care Office Visit  Subjective:    Patient ID: Jennifer Saunders, female    DOB: [birth year], 70 y.o..   MRN: [id number]  Chief Complaint  Patient presents with  . Establish Care    NP- establish care for HTN/DM. C/o having stomach issues (growling) and some indigestion x 2 weeks.   She has taken Prilosec and then has stopped taking it.     History of Present Illness:  Patient is in today to establish care. Ms. Picking is from [place]. She is a widow. She has a daughter and one grandchild. 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 Now she stays engaged with the [employer] and church activities. She denies tobacco, alcohol, or drug use.  Ms. Paulsen has a history of Type 2 diabetes, hypertension, and hyperlipidemia. She is on metformin for the diabetes. Her blood pressure is managed on amlodipine, carvedilol, and losartan/HCTZ. She has had a foot exam and eye exam within the past year. She also has been noted to have NASH. She notes she has done regular walking in the past, but she stopped when cold weather came this winter. Also, her walking partner is now using a cane. She does plan to get back to some walking.  Ms. Brunell had a DEXA scan in Jan. This showed a T-score of -1.2 at the left femoral neck. She is currently managed on calcium and Vit. D. She has trouble taking the large combined pill, so she takes these as separate supplements.  Ms. Maeder notes some occasional gurgling in her stomach. She has a past history of GERD. She occasionally has noted an area of chest pressure in the left chest since she had COVID in 2020.  Past Medical History: Patient Active Problem List   Diagnosis Date Noted  . COVID-19 08/19/2020  . Osteopenia 08/19/2020  . Neck pain on right side 06/02/2016  . NASH  (nonalcoholic steatohepatitis) 04/11/2016  . History of adenomatous polyp of colon 10/17/2015  . Morbid obesity ([place]) 10/17/2015  . GERD (gastroesophageal reflux disease) 08/15/2015  . Low TSH level 01/15/2014  . Undiagnosed cardiac murmurs 12/17/2012  . Varicose veins 08/18/2010  . Cerebrovascular disease- small vessel 05/17/2008  . Hyperlipidemia 03/18/2008  . History of kidney stones 03/18/2008  . History of shingles 04/25/2007  . Type 2 diabetes mellitus ([place]) 12/28/2006  . Anxiety state 12/28/2006  . Essential hypertension 12/28/2006   Past Surgical History:  Procedure Laterality Date  . COLONOSCOPY    . no prior surgery     Family History  Problem Relation Age of Onset  . Cancer Sister        Colon  . Intracerebral hemorrhage Sister   . Colon cancer Sister 35  . Cancer Mother        Breast  . Heart disease Mother        CVA x 2  . Breast cancer Mother 39  . Diabetes Maternal Aunt   . Diabetes Paternal Aunt   . Aneurysm Sister   . Hypertension Sister   . Heart disease Sister    Outpatient Medications Prior to Visit  Medication Sig Dispense Refill  . Alcohol Swabs PADS Use daily before blood glucose 100 each 3  . amLODipine (NORVASC) 5 MG tablet TAKE 1 TABLET EVERY DAY 90 tablet 1  . aspirin 81 MG tablet Take 81  mg by mouth daily.    . Blood Glucose Calibration (TRUE METRIX LEVEL 2) Normal SOLN Use daily to check blood glucose controls Dx: E11.9 1 each 2  . Blood Glucose Monitoring Suppl (TRUE METRIX METER) w/Device KIT Check blood glucose once daily Dx: E11.9 1 kit 0  . Calcium Carbonate-Vit D-Min (CALCIUM 1200) 1200-1000 MG-UNIT CHEW Chew 1 tablet by mouth daily. 30 tablet 0  . clotrimazole-betamethasone (LOTRISONE) cream Apply 1 application topically 2 (two) times daily. For up to 2 weeks. 30 g 0  . glucose blood (TRUE METRIX BLOOD GLUCOSE TEST) test strip Check blood glucose daily. Dx: E11.9 100 each 12  . losartan-hydrochlorothiazide (HYZAAR) 100-25 MG tablet  TAKE 1 TABLET EVERY DAY 60 tablet 0  . metFORMIN (GLUCOPHAGE-XR) 500 MG 24 hr tablet TAKE 2 TABLETS TWICE DAILY 360 tablet 1  . TRUEplus Lancets 33G MISC Check blood glucose daily Dx: E11.9 100 each 3  . carvedilol (COREG) 12.5 MG tablet TAKE 1 TABLET TWICE DAILY WITH A MEAL 180 tablet 1  . FLUZONE HIGH-DOSE QUADRIVALENT 0.7 ML SUSY  (Patient not taking: Reported on 08/19/2020)     No facility-administered medications prior to visit.   No Known Allergies    Objective:   Today's Vitals   08/19/20 1354  BP: 122/84  Pulse: 81  Temp: (!) 97.3 F (36.3 C)  TempSrc: Temporal  SpO2: 94%  Weight: 219 lb 12.8 oz (99.7 kg)  Height: 5' 1"  (1.549 m)   Body mass index is 41.53 kg/m.   General: Well developed, well nourished. No acute distress. HEENT: Normocephalic, non-traumatic. PERRL, EOMI. Conjunctiva clear. External ears normal. EAC and TMs normal    bilaterally. Nose clear without congestion or rhinorrhea. Mucous membranes moist. Oropharynx clear. Good dentition. Neck: Supple. No lymphadenopathy. No thyromegaly. Lungs: Clear to auscultation bilaterally. No wheezing, rales or rhonchi. CV: RRR without murmurs or rubs. Pulses 2+ bilaterally. Abdomen: Soft, non-tender. Bowel sounds positive, normal pitch and frequency. No hepatosplenomegaly. No rebound or   guarding. Extremities: Full ROM. No joint swelling or tenderness. No edema noted. Feet: Skin intact. Normal pulses. Skin: Warm and dry. No rashes. Psych: Alert and oriented. Normal mood and affect.  Health Maintenance Due  Topic Date Due  . HEMOGLOBIN A1C  05/25/2020     Assessment & Plan:   1. Essential hypertension Blood pressure is at goal. We will continue her current 4-drug therapy.  - carvedilol (COREG) 12.5 MG tablet; Take 1 tablet (12.5 mg total) by mouth 2 (two) times daily with a meal.  Dispense: 180 tablet; Refill: 3  2. NASH (nonalcoholic steatohepatitis) I will check her LFTs to monitor this condition.  Ultimately, weight loss through exercise is the best way she has of reducing any fatty liver infiltration.  - Comprehensive metabolic panel; Future  3. Type 2 diabetes mellitus without complication, without long-term current use of insulin (HCC) We will check her annual DM labs to assess current control and monitor for renal disease.  - Microalbumin / creatinine urine ratio; Future - Hemoglobin A1c; Future - Urinalysis, Routine w reflex microscopic; Future  4. Hyperlipidemia, unspecified hyperlipidemia type Due for lipid panel to assess current levels. With her history of small-vessel ischmic changes on a prior MRI scan of the brain, she may need ot start a statin to reduce stroke risk.  - Lipid panel; Future  5. Low TSH level Past history of an abnormal TSH. We will reassess.  - TSH; Future    Haydee Salter, MD

## 2020-08-22 ENCOUNTER — Other Ambulatory Visit: Payer: Self-pay

## 2020-08-22 ENCOUNTER — Other Ambulatory Visit (INDEPENDENT_AMBULATORY_CARE_PROVIDER_SITE_OTHER): Payer: Medicare HMO

## 2020-08-22 DIAGNOSIS — E785 Hyperlipidemia, unspecified: Secondary | ICD-10-CM | POA: Diagnosis not present

## 2020-08-22 DIAGNOSIS — R7989 Other specified abnormal findings of blood chemistry: Secondary | ICD-10-CM

## 2020-08-22 DIAGNOSIS — K7581 Nonalcoholic steatohepatitis (NASH): Secondary | ICD-10-CM

## 2020-08-22 DIAGNOSIS — E119 Type 2 diabetes mellitus without complications: Secondary | ICD-10-CM | POA: Diagnosis not present

## 2020-08-22 LAB — URINALYSIS, ROUTINE W REFLEX MICROSCOPIC
Bilirubin Urine: NEGATIVE
Hgb urine dipstick: NEGATIVE
Ketones, ur: NEGATIVE
Nitrite: NEGATIVE
RBC / HPF: NONE SEEN (ref 0–?)
Specific Gravity, Urine: >=1.03 — AB (ref 1.000–1.030)
Total Protein, Urine: NEGATIVE
Urine Glucose: NEGATIVE
Urobilinogen, UA: 0.2 (ref 0.0–1.0)
pH: 5.5 (ref 5.0–8.0)

## 2020-08-22 LAB — COMPREHENSIVE METABOLIC PANEL
ALT: 40 U/L — ABNORMAL HIGH (ref 0–35)
AST: 28 U/L (ref 0–37)
Albumin: 4 g/dL (ref 3.5–5.2)
Alkaline Phosphatase: 72 U/L (ref 39–117)
BUN: 18 mg/dL (ref 6–23)
CO2: 29 mEq/L (ref 19–32)
Calcium: 10.7 mg/dL — ABNORMAL HIGH (ref 8.4–10.5)
Chloride: 100 mEq/L (ref 96–112)
Creatinine, Ser: 0.87 mg/dL (ref 0.40–1.20)
GFR: 66.86 mL/min (ref >60.00)
Glucose, Bld: 105 mg/dL — ABNORMAL HIGH (ref 70–99)
Potassium: 4.1 mEq/L (ref 3.5–5.1)
Sodium: 138 mEq/L (ref 135–145)
Total Bilirubin: 0.7 mg/dL (ref 0.2–1.2)
Total Protein: 6.9 g/dL (ref 6.0–8.3)

## 2020-08-22 LAB — HEMOGLOBIN A1C: Hgb A1c MFr Bld: 6.5 % (ref 4.6–6.5)

## 2020-08-22 LAB — LIPID PANEL
Cholesterol: 102 mg/dL (ref 0–200)
HDL: 36.7 mg/dL — ABNORMAL LOW (ref >39.00)
LDL Cholesterol: 46 mg/dL (ref 0–99)
NonHDL: 65.66
Total CHOL/HDL Ratio: 3
Triglycerides: 99 mg/dL (ref 0.0–149.0)
VLDL: 19.8 mg/dL (ref 0.0–40.0)

## 2020-08-22 LAB — MICROALBUMIN / CREATININE URINE RATIO
Creatinine,U: 236.3 mg/dL
Microalb Creat Ratio: 0.4 mg/g (ref 0.0–30.0)
Microalb, Ur: 1 mg/dL (ref 0.0–1.9)

## 2020-08-22 LAB — TSH: TSH: 2.03 u[IU]/mL (ref 0.35–4.50)

## 2020-08-22 MED ORDER — LOSARTAN POTASSIUM-HCTZ 100-25 MG PO TABS
1.0000 | ORAL_TABLET | Freq: Every day | ORAL | 0 refills | Status: DC
Start: 2020-08-22 — End: 2020-08-29

## 2020-08-22 NOTE — Progress Notes (Signed)
Per orders of Dr. Gena Fray pt is here for labs, pt tolerated the draw well.

## 2020-08-29 ENCOUNTER — Telehealth: Payer: Self-pay | Admitting: Family Medicine

## 2020-08-29 DIAGNOSIS — I10 Essential (primary) hypertension: Secondary | ICD-10-CM

## 2020-08-29 MED ORDER — AMLODIPINE BESYLATE 5 MG PO TABS: 1.0000 | ORAL_TABLET | Freq: Every day | ORAL | 0 refills | Status: DC

## 2020-08-29 MED ORDER — LOSARTAN POTASSIUM-HCTZ 100-25 MG PO TABS: 1.0000 | ORAL_TABLET | Freq: Every day | ORAL | 0 refills | Status: DC

## 2020-08-29 NOTE — Telephone Encounter (Signed)
Called patient and advised that RX were sent to mail order.  Dm/cma

## 2020-08-29 NOTE — Telephone Encounter (Signed)
Patient is calling to get refills on her Amlodipine and Losartan (Losartan sent in on 04/25, but pharmacy did not receive it). Please call [facility] at  [phone].

## 2020-09-09 ENCOUNTER — Telehealth: Payer: Medicare HMO

## 2020-09-19 IMAGING — MG DIGITAL SCREENING BILAT W/ TOMO W/ CAD
8 of 15 series · 8 of 40 positions shown · non-contrast
Comparison: Previous exam(s).

CLINICAL DATA: Screening.

EXAM:
DIGITAL SCREENING BILATERAL MAMMOGRAM WITH TOMO AND CAD

[L CC synth-2D]
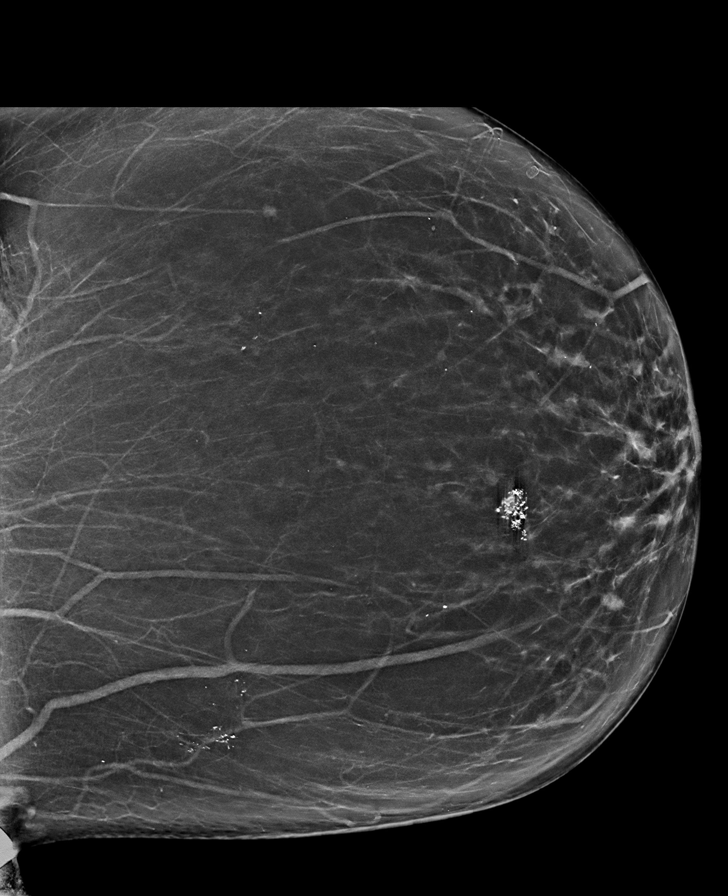

[R CC synth-2D]
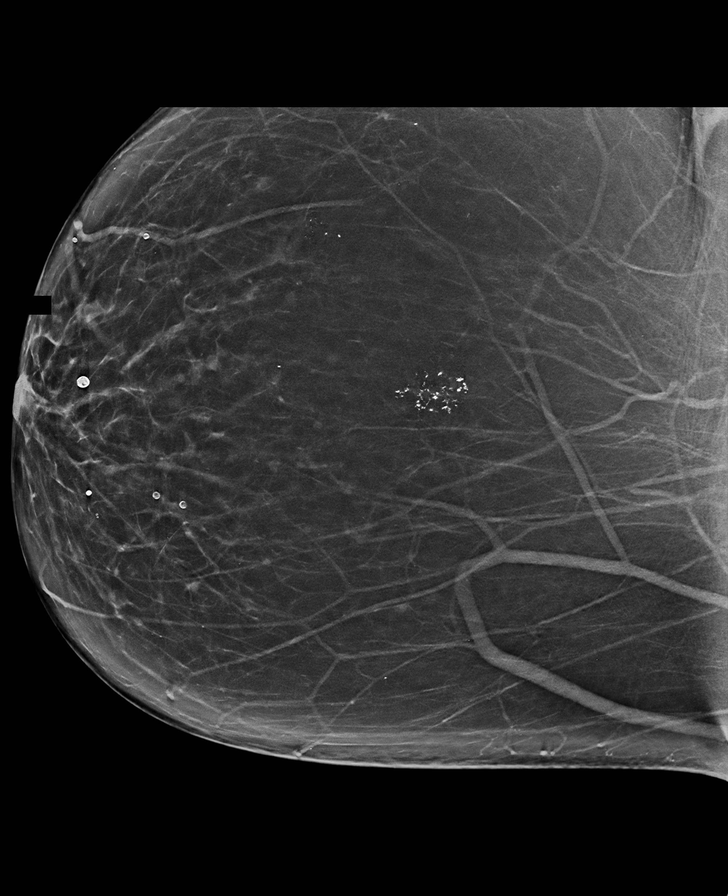

[R MLO synth-2D (1 of 3)]
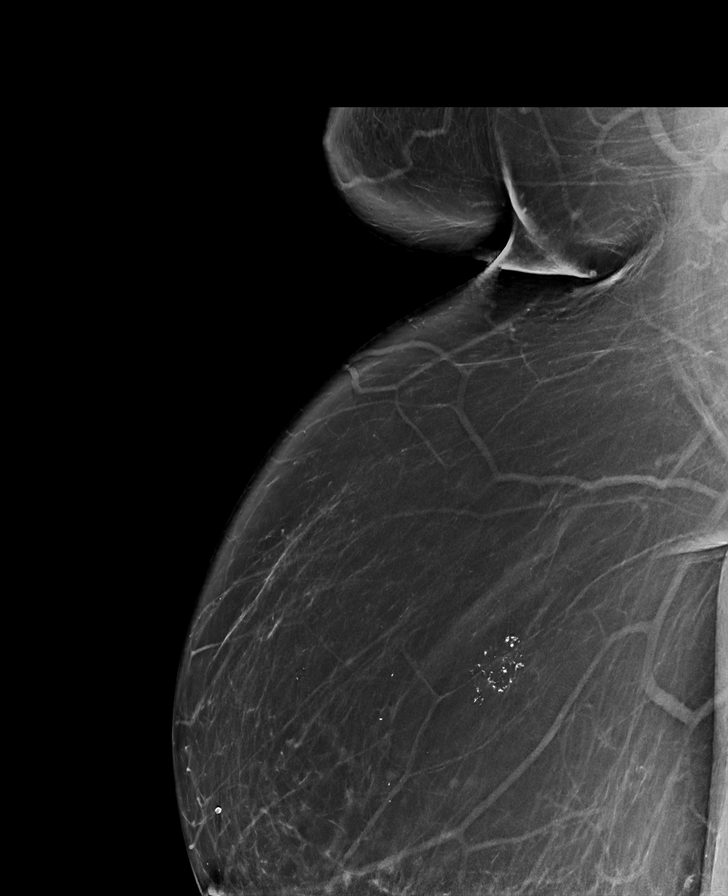

[R XCCL synth-2D]
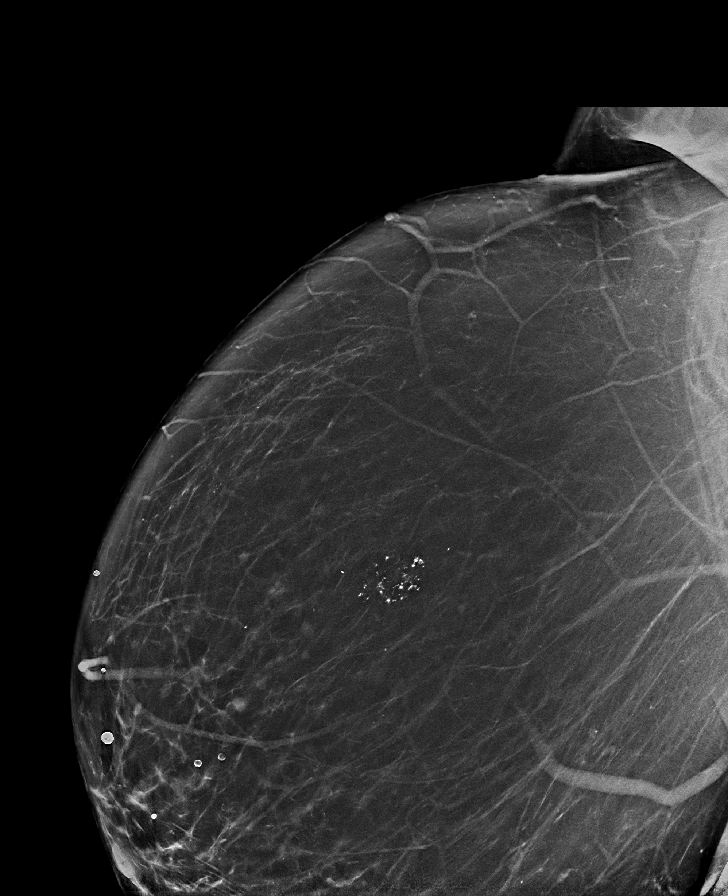

[L MLO synth-2D]
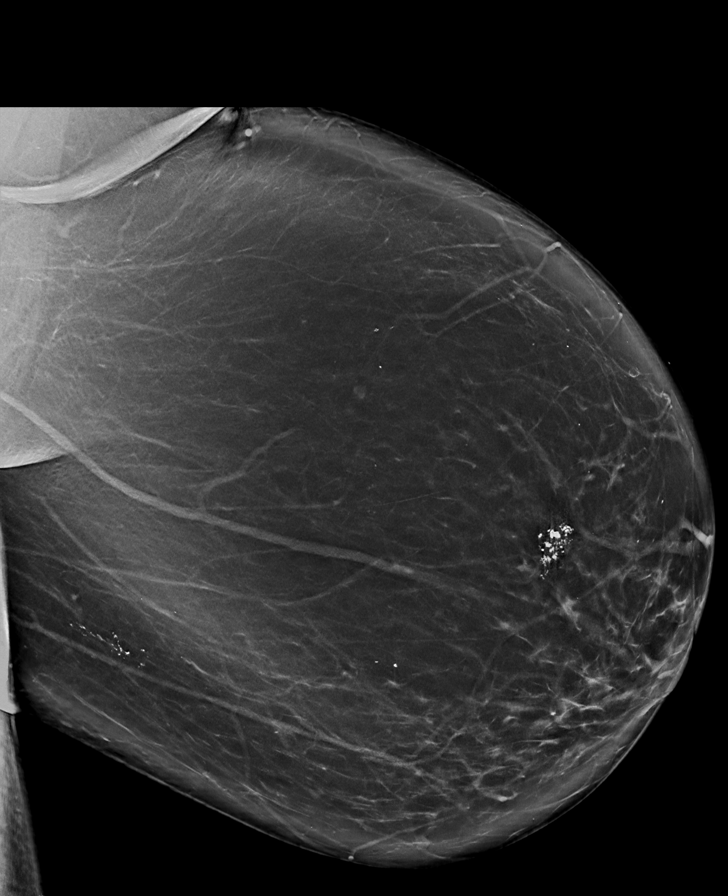

[R MLO synth-2D (2 of 3)]
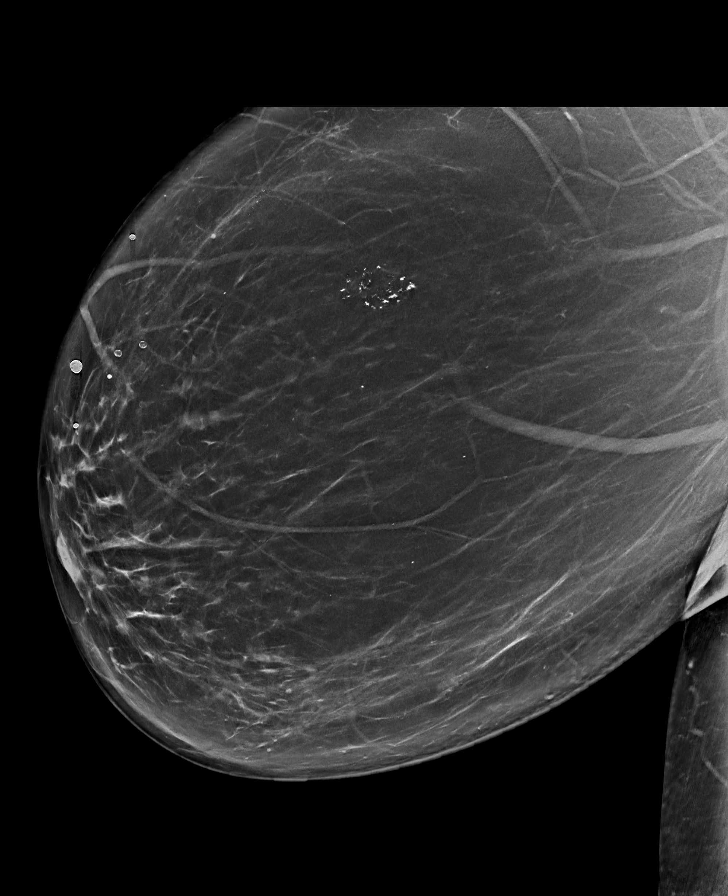

[R MLO synth-2D (3 of 3)]
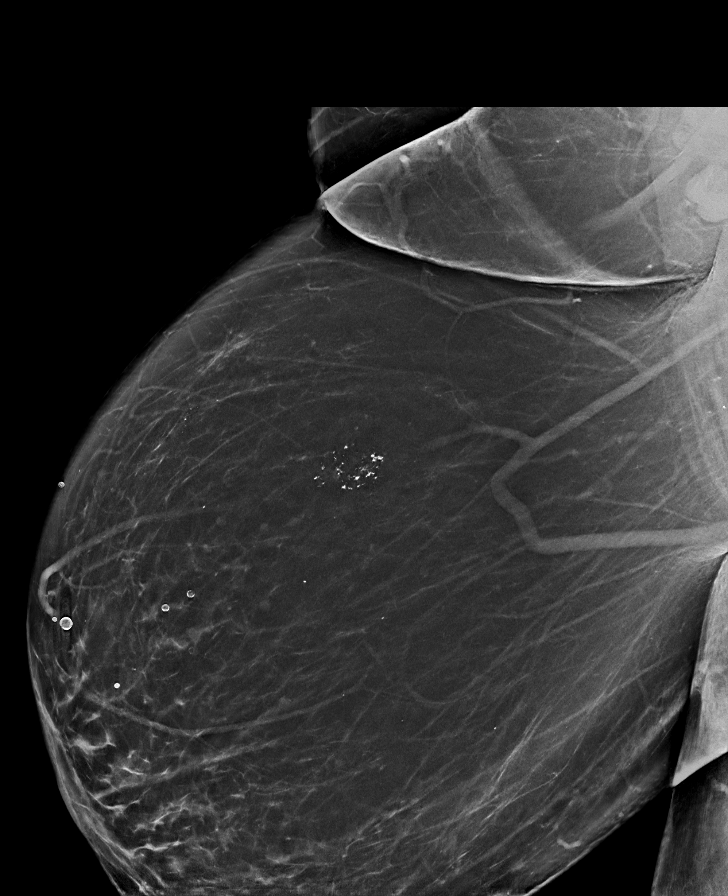

[R MLO tomo · tomo slice 66/96.0]
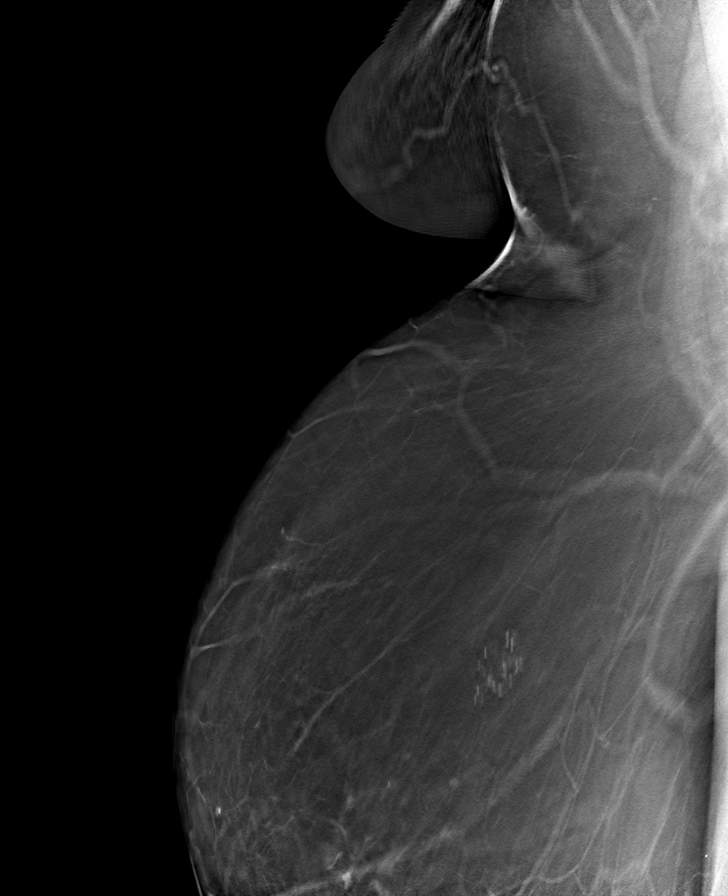

[8 of 40 positions shown; findings below may reference images not displayed]

ACR Breast Density Category b: There are scattered areas of
fibroglandular density.
FINDINGS: There are no findings suspicious for malignancy. Images were
processed with CAD.
IMPRESSION: No mammographic evidence of malignancy. A result letter of this
screening mammogram will be mailed directly to the patient.

RECOMMENDATION:
Screening mammogram in one year. (Code:[id number])

BI-RADS CATEGORY  1: Negative.

## 2020-11-18 ENCOUNTER — Encounter: Payer: Self-pay | Admitting: Family Medicine

## 2020-11-18 ENCOUNTER — Other Ambulatory Visit: Payer: Self-pay

## 2020-11-18 ENCOUNTER — Ambulatory Visit (INDEPENDENT_AMBULATORY_CARE_PROVIDER_SITE_OTHER): Payer: Medicare HMO | Admitting: Family Medicine

## 2020-11-18 VITALS — BP 120/76 | HR 75 | Temp 97.0°F | Ht 61.0 in | Wt 218.6 lb

## 2020-11-18 DIAGNOSIS — E119 Type 2 diabetes mellitus without complications: Secondary | ICD-10-CM

## 2020-11-18 DIAGNOSIS — M19012 Primary osteoarthritis, left shoulder: Secondary | ICD-10-CM

## 2020-11-18 DIAGNOSIS — I10 Essential (primary) hypertension: Secondary | ICD-10-CM

## 2020-11-18 NOTE — Progress Notes (Signed)
[facility] [address] [place] [address] Dept: [phone] Dept Fax: [phone]  Chronic Care Office Visit  Subjective:    Patient ID: Jennifer Saunders, female    DOB: [birth year], 70 y.o..   MRN: [id number]  Chief Complaint  Patient presents with   Follow-up    3 month f/u HTN, DM.  Average BP 128/80.  Doesn't check BS at home.     History of Present Illness:  Patient is in today for reassessment of chronic medical issues.  Jennifer Saunders has a history of Type 2 diabetes managed with metformin, hypertension managed with Hyzaar, amlodipine, and carvedilol. Although she has had prior cerebrovascular disease and hyperlipidemia, her lipids have been in excellent control without use of a statin.  Jennifer Saunders notes that she has been having occasional pain in her left shoulder area for 2 1/2 years. She notes this hurts occasionally with movement of her l;eft arm. It is not worsened with exertion. She denies any other chest pain.  Past Medical History: Patient Active Problem List   Diagnosis Date Noted   COVID-19 08/19/2020   Osteopenia 08/19/2020   Neck pain on right side 06/02/2016   NASH (nonalcoholic steatohepatitis) 04/11/2016   History of adenomatous polyp of colon 10/17/2015   Morbid obesity ([place]) 10/17/2015   GERD (gastroesophageal reflux disease) 08/15/2015   Low TSH level 01/15/2014   Undiagnosed cardiac murmurs 12/17/2012   Varicose veins 08/18/2010   Cerebrovascular disease- small vessel 05/17/2008   Hyperlipidemia 03/18/2008   History of kidney stones 03/18/2008   History of shingles 04/25/2007   Type 2 diabetes mellitus ([place]) 12/28/2006   Anxiety state 12/28/2006   Essential hypertension 12/28/2006   Past Surgical History:  Procedure Laterality Date   COLONOSCOPY     Family History  Problem Relation Age of Onset   Cancer Sister        Colon   Intracerebral hemorrhage Sister    Colon cancer  Sister 76   Cancer Mother        Breast   Heart disease Mother        CVA x 2   Breast cancer Mother 84   Diabetes Maternal Aunt    Diabetes Paternal Aunt    Aneurysm Sister    Hypertension Sister    Heart disease Sister    Outpatient Medications Prior to Visit  Medication Sig Dispense Refill   Alcohol Swabs PADS Use daily before blood glucose 100 each 3   amLODipine (NORVASC) 5 MG tablet Take 1 tablet (5 mg total) by mouth daily. 90 tablet 0   aspirin 81 MG tablet Take 81 mg by mouth daily.     Blood Glucose Calibration (TRUE METRIX LEVEL 2) Normal SOLN Use daily to check blood glucose controls Dx: E11.9 1 each 2   Blood Glucose Monitoring Suppl (TRUE METRIX METER) w/Device KIT Check blood glucose once daily Dx: E11.9 1 kit 0   Calcium Carbonate-Vit D-Min (CALCIUM 1200) 1200-1000 MG-UNIT CHEW Chew 1 tablet by mouth daily. 30 tablet 0   carvedilol (COREG) 12.5 MG tablet Take 1 tablet (12.5 mg total) by mouth 2 (two) times daily with a meal. 180 tablet 3   clotrimazole-betamethasone (LOTRISONE) cream Apply 1 application topically 2 (two) times daily. For up to 2 weeks. 30 g 0   glucose blood (TRUE METRIX BLOOD GLUCOSE TEST) test strip Check blood glucose daily. Dx: E11.9 100 each 12   losartan-hydrochlorothiazide (HYZAAR) 100-25 MG tablet Take 1 tablet  by mouth daily. 90 tablet 0   metFORMIN (GLUCOPHAGE-XR) 500 MG 24 hr tablet TAKE 2 TABLETS TWICE DAILY 360 tablet 1   TRUEplus Lancets 33G MISC Check blood glucose daily Dx: E11.9 100 each 3   No facility-administered medications prior to visit.   No Known Allergies    Objective:   Today's Vitals   11/18/20 1357  BP: 120/76  Pulse: 75  Temp: (!) 97 F (36.1 C)  TempSrc: Temporal  SpO2: 97%  Weight: 218 lb 9.6 oz (99.2 kg)  Height: 5' 1"  (1.549 m)   Body mass index is 41.3 kg/m.   General: Well developed, well nourished. No acute distress. CV: RRR without murmurs or rubs. Pulses 2+ bilaterally. Extremities: Full ROM. No  joint swelling. Mild pain with certain movements. Normal rotator cuff strength and no pain with resisted abduction/internal rotation.  Psych: Alert and oriented. Normal mood and affect.  Health Maintenance Due  Topic Date Due   Zoster Vaccines- Shingrix (1 of 2) Never done   COVID-19 Vaccine (4 - Booster for [employer] series) 08/24/2020   Lab Results Lab Results  Component Value Date   CHOL 102 08/22/2020   HDL 36.70 (L) 08/22/2020   LDLCALC 46 08/22/2020   TRIG 99.0 08/22/2020   CHOLHDL 3 08/22/2020   Lab Results  Component Value Date   HGBA1C 6.5 08/22/2020     Assessment & Plan:   1. Type 2 diabetes mellitus without complication, without long-term current use of insulin ([place]) Due for quarterly Dm labs. In good control previously on metformin. Weight is stable. UTD on screenings. Plan to reassess in 3 months.  - Glucose, random - Hemoglobin A1c  2. Essential hypertension Blood pressure at goal on 3-drug therapy.  3. Arthritis of left shoulder region Exam consistent with mild arthritis of the glenohumeral joint. Recommend watchful waiting at this point.  Haydee Salter, MD

## 2020-11-19 LAB — HEMOGLOBIN A1C
Est. average glucose Bld gHb Est-mCnc: 137 mg/dL
Hgb A1c MFr Bld: 6.4 % — ABNORMAL HIGH (ref 4.8–5.6)

## 2020-11-19 LAB — GLUCOSE, RANDOM: Glucose: 98 mg/dL (ref 65–99)

## 2020-12-02 IMAGING — DX DG CHEST 1V PORT
1 series · 1 of 1 positions shown · non-contrast
Comparison: None.

CLINICAL DATA: Two weeks post COVID with shortness of breath onset
this morning.

EXAM:
PORTABLE CHEST 1 VIEW

[chest ap]
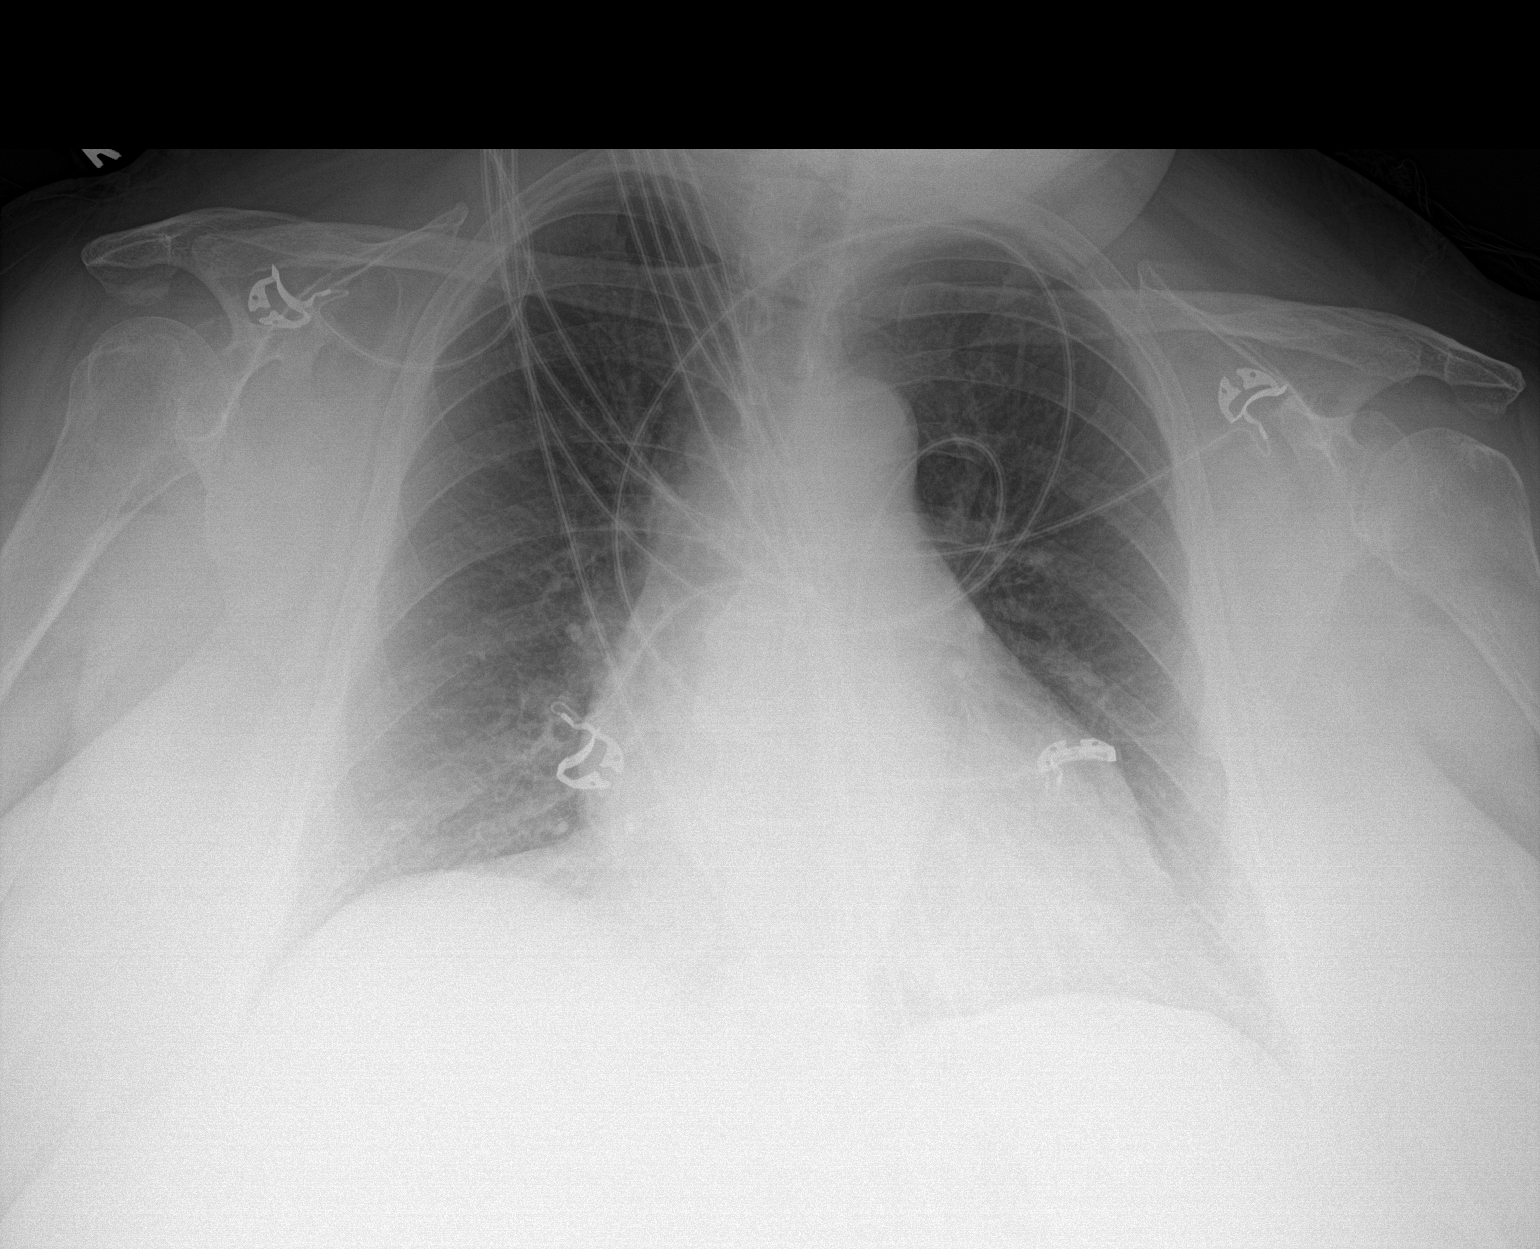

[1 of 1 positions shown; findings below may reference images not displayed]

FINDINGS: Borderline cardiomegaly. Lungs are clear. No pleural effusion or
pneumothorax is seen. Osseous structures about the chest are
unremarkable.
IMPRESSION: No acute findings. No evidence of pneumonia. Borderline
cardiomegaly.

## 2020-12-08 ENCOUNTER — Other Ambulatory Visit: Payer: Self-pay

## 2020-12-08 DIAGNOSIS — E119 Type 2 diabetes mellitus without complications: Secondary | ICD-10-CM

## 2020-12-08 MED ORDER — METFORMIN HCL ER 500 MG PO TB24
1000.0000 mg | ORAL_TABLET | Freq: Two times a day (BID) | ORAL | 1 refills | Status: DC
Start: 2020-12-08 — End: 2021-06-19

## 2021-01-02 ENCOUNTER — Other Ambulatory Visit: Payer: Self-pay | Admitting: Family Medicine

## 2021-01-02 DIAGNOSIS — I10 Essential (primary) hypertension: Secondary | ICD-10-CM

## 2021-01-16 DIAGNOSIS — H2513 Age-related nuclear cataract, bilateral: Secondary | ICD-10-CM | POA: Diagnosis not present

## 2021-01-16 DIAGNOSIS — H524 Presbyopia: Secondary | ICD-10-CM | POA: Diagnosis not present

## 2021-01-16 DIAGNOSIS — H52223 Regular astigmatism, bilateral: Secondary | ICD-10-CM | POA: Diagnosis not present

## 2021-01-16 DIAGNOSIS — E119 Type 2 diabetes mellitus without complications: Secondary | ICD-10-CM | POA: Diagnosis not present

## 2021-01-19 LAB — HM DIABETES EYE EXAM

## 2021-01-25 ENCOUNTER — Other Ambulatory Visit: Payer: Self-pay | Admitting: Family Medicine

## 2021-02-17 ENCOUNTER — Other Ambulatory Visit: Payer: Self-pay

## 2021-02-20 ENCOUNTER — Ambulatory Visit (INDEPENDENT_AMBULATORY_CARE_PROVIDER_SITE_OTHER): Payer: Medicare HMO | Admitting: Family Medicine

## 2021-02-20 ENCOUNTER — Other Ambulatory Visit: Payer: Self-pay

## 2021-02-20 VITALS — BP 114/68 | HR 84 | Temp 97.5°F | Ht 61.0 in | Wt 222.6 lb

## 2021-02-20 DIAGNOSIS — I10 Essential (primary) hypertension: Secondary | ICD-10-CM

## 2021-02-20 DIAGNOSIS — E785 Hyperlipidemia, unspecified: Secondary | ICD-10-CM

## 2021-02-20 DIAGNOSIS — E119 Type 2 diabetes mellitus without complications: Secondary | ICD-10-CM

## 2021-02-20 DIAGNOSIS — Z6841 Body Mass Index (BMI) 40.0 and over, adult: Secondary | ICD-10-CM | POA: Diagnosis not present

## 2021-02-20 LAB — GLUCOSE, RANDOM: Glucose, Bld: 97 mg/dL (ref 70–99)

## 2021-02-20 LAB — HEMOGLOBIN A1C: Hgb A1c MFr Bld: 6.4 % (ref 4.6–6.5)

## 2021-02-20 NOTE — Progress Notes (Signed)
[facility] [address] [place] [address] Dept: [phone] Dept Fax: [phone]  Chronic Care Office Visit  Subjective:    Patient ID: Jennifer Saunders, female    DOB: 08/02/1950, 70 y.o..   MRN: [id number]  Chief Complaint  Patient presents with   Follow-up    3 month f/u HTN/DM.      History of Present Illness:  Patient is in today for reassessment of chronic medical issues.  Jennifer Saunders has a history of Type 2 diabetes managed with metformin, hypertension managed with Hyzaar, amlodipine, and carvedilol. Although she has had prior cerebrovascular disease and hyperlipidemia, her lipids have been in excellent control without use of a statin. She does take a daily aspirin for secondary prevention.  Jennifer Saunders notes that some of her family recently went ot a seminar with a focus of getting goff of metformin. They are using a product with cinnamon oil and following a special diet. She prefers not to go on special diets, so is okay continuing as she is. She would like to lose weight . She plans to restart walking, as she had gotten away from this.  Past Medical History: Patient Active Problem List   Diagnosis Date Noted   COVID-19 08/19/2020   Osteopenia 08/19/2020   Neck pain on right side 06/02/2016   NASH (nonalcoholic steatohepatitis) 04/11/2016   History of adenomatous polyp of colon 10/17/2015   Morbid obesity ([place]) 10/17/2015   GERD (gastroesophageal reflux disease) 08/15/2015   Undiagnosed cardiac murmurs 12/17/2012   Varicose veins 08/18/2010   Cerebrovascular disease- small vessel 05/17/2008   Hyperlipidemia 03/18/2008   History of kidney stones 03/18/2008   History of shingles 04/25/2007   Type 2 diabetes mellitus ([place]) 12/28/2006   Anxiety state 12/28/2006   Essential hypertension 12/28/2006   Past Surgical History:  Procedure Laterality Date   COLONOSCOPY     Family History  Problem Relation  Age of Onset   Cancer Sister        Colon   Intracerebral hemorrhage Sister    Colon cancer Sister 32   Cancer Mother        Breast   Heart disease Mother        CVA x 2   Breast cancer Mother 58   Diabetes Maternal Aunt    Diabetes Paternal Aunt    Aneurysm Sister    Hypertension Sister    Heart disease Sister    Outpatient Medications Prior to Visit  Medication Sig Dispense Refill   Alcohol Swabs PADS Use daily before blood glucose 100 each 3   amLODipine (NORVASC) 5 MG tablet TAKE 1 TABLET EVERY DAY 90 tablet 0   aspirin 81 MG tablet Take 81 mg by mouth daily.     Blood Glucose Calibration (TRUE METRIX LEVEL 2) Normal SOLN Use daily to check blood glucose controls Dx: E11.9 1 each 2   Blood Glucose Monitoring Suppl (TRUE METRIX METER) w/Device KIT Check blood glucose once daily Dx: E11.9 1 kit 0   Calcium Carbonate-Vit D-Min (CALCIUM 1200) 1200-1000 MG-UNIT CHEW Chew 1 tablet by mouth daily. 30 tablet 0   carvedilol (COREG) 12.5 MG tablet Take 1 tablet (12.5 mg total) by mouth 2 (two) times daily with a meal. 180 tablet 3   clotrimazole-betamethasone (LOTRISONE) cream Apply 1 application topically 2 (two) times daily. For up to 2 weeks. 30 g 0   glucose blood (TRUE METRIX BLOOD GLUCOSE TEST) test strip Check blood glucose daily.  Dx: E11.9 100 each 12   losartan-hydrochlorothiazide (HYZAAR) 100-25 MG tablet TAKE 1 TABLET EVERY DAY 90 tablet 3   metFORMIN (GLUCOPHAGE-XR) 500 MG 24 hr tablet Take 2 tablets (1,000 mg total) by mouth 2 (two) times daily. 360 tablet 1   TRUEplus Lancets 33G MISC Check blood glucose daily Dx: E11.9 100 each 3   No facility-administered medications prior to visit.   No Known Allergies    Objective:   Today's Vitals   02/20/21 1301  BP: 114/68  Pulse: 84  Temp: (!) 97.5 F (36.4 C)  TempSrc: Temporal  SpO2: 98%  Weight: 222 lb 9.6 oz (101 kg)  Height: 5' 1"  (1.549 m)   Body mass index is 42.06 kg/m.   General: Well developed, well  nourished. No acute distress. Feet: Skin intact. Pulses 2+. 5.07 monofilament testing normal. Psych: Alert and oriented x3. Normal mood and affect.  Health Maintenance Due  Topic Date Due   Zoster Vaccines- Shingrix (1 of 2) Never done   COVID-19 Vaccine (4 - Booster for [employer] series) 06/20/2020   OPHTHALMOLOGY EXAM  11/19/2020   FOOT EXAM  11/22/2020   Lab Results Lab Results  Component Value Date   CHOL 102 08/22/2020   HDL 36.70 (L) 08/22/2020   LDLCALC 46 08/22/2020   TRIG 99.0 08/22/2020   CHOLHDL 3 08/22/2020   Lab Results  Component Value Date   HGBA1C 6.4 (H) 11/18/2020     Assessment & Plan:   1. Type 2 diabetes mellitus without complication, without long-term current use of insulin ([place]) Diabetes has been at goal. Foot exam completed today. Jennifer Saunders recently had her annual eye exam. We will check quarterly labs and I plan to continue her on metformin.  - Glucose, random - Hemoglobin A1c  2. Essential hypertension Blood pressure is at goal. Continue amlodipine, carvedilol, and losartan-HCTZ.  3. Hyperlipidemia, unspecified hyperlipidemia type Lipids are at goal. I will plan to check fasting labs at her next visit in 3 months.  4. Morbid obesity with BMI of 40.0-44.9, adult ([place]) We discussed making small, sustainable changes to her diet to loose weight. I encouraged her to follow through with her plans to start back to walking. I recommend she start slow and gradually increase activity. She could start with 15 min. of walking 2-3 days a week and move up from there.   Haydee Salter, MD

## 2021-03-28 ENCOUNTER — Other Ambulatory Visit: Payer: Self-pay | Admitting: Family Medicine

## 2021-03-28 DIAGNOSIS — Z1231 Encounter for screening mammogram for malignant neoplasm of breast: Secondary | ICD-10-CM

## 2021-03-30 DIAGNOSIS — Z20822 Contact with and (suspected) exposure to covid-19: Secondary | ICD-10-CM | POA: Diagnosis not present

## 2021-04-03 ENCOUNTER — Ambulatory Visit: Payer: Medicare HMO

## 2021-04-04 ENCOUNTER — Telehealth: Payer: Self-pay | Admitting: Family Medicine

## 2021-04-04 NOTE — Telephone Encounter (Signed)
Patient is having lingering congestion since being diagnosed with Covid on 03/30/21(By [facility]).  She has been taking Coricidin DM and Mucinex with little relief. Is there anything else she can do to help relief the symptoms? Please review and advise.  Thanks. Dm/cma

## 2021-04-05 NOTE — Telephone Encounter (Signed)
Patient notified [facility] phone. No further questions. Dm/cma

## 2021-04-12 ENCOUNTER — Ambulatory Visit (INDEPENDENT_AMBULATORY_CARE_PROVIDER_SITE_OTHER): Payer: Medicare HMO

## 2021-04-12 DIAGNOSIS — Z0000 Encounter for general adult medical examination without abnormal findings: Secondary | ICD-10-CM | POA: Diagnosis not present

## 2021-04-12 NOTE — Patient Instructions (Signed)
Jennifer Saunders , Thank you for taking time to come for your Medicare Wellness Visit. I appreciate your ongoing commitment to your health goals. Please review the following plan we discussed and let me know if I can assist you in the future.   Screening recommendations/referrals: Colonoscopy: 11/13/2017  due 2024 Mammogram: scheduled 04/20/2021 Bone Density: 05/20/2020 Recommended yearly ophthalmology/optometry visit for glaucoma screening and checkup Recommended yearly dental visit for hygiene and checkup  Vaccinations: Influenza vaccine: completed  Pneumococcal vaccine: completed  Tdap vaccine: 01/15/2014 Shingles vaccine: will consider     Advanced directives: none   Conditions/risks identified: none   Next appointment: none    Preventive Care 71 Years and Older, Female Preventive care refers to lifestyle choices and visits with your health care provider that can promote health and wellness. What does preventive care include? A yearly physical exam. This is also called an annual well check. Dental exams once or twice a year. Routine eye exams. Ask your health care provider how often you should have your eyes checked. Personal lifestyle choices, including: Daily care of your teeth and gums. Regular physical activity. Eating a healthy diet. Avoiding tobacco and drug use. Limiting alcohol use. Practicing safe sex. Taking low-dose aspirin every day. Taking vitamin and mineral supplements as recommended by your health care provider. What happens during an annual well check? The services and screenings done by your health care provider during your annual well check will depend on your age, overall health, lifestyle risk factors, and family history of disease. Counseling  Your health care provider may ask you questions about your: Alcohol use. Tobacco use. Drug use. Emotional well-being. Home and relationship well-being. Sexual activity. Eating habits. History of  falls. Memory and ability to understand (cognition). Work and work Statistician. Reproductive health. Screening  You may have the following tests or measurements: Height, weight, and BMI. Blood pressure. Lipid and cholesterol levels. These may be checked every 5 years, or more frequently if you are over 70 years old. Skin check. Lung cancer screening. You may have this screening every year starting at age 65 if you have a 30-pack-year history of smoking and currently smoke or have quit within the past 15 years. Fecal occult blood test (FOBT) of the stool. You may have this test every year starting at age 53. Flexible sigmoidoscopy or colonoscopy. You may have a sigmoidoscopy every 5 years or a colonoscopy every 10 years starting at age 28. Hepatitis C blood test. Hepatitis B blood test. Sexually transmitted disease (STD) testing. Diabetes screening. This is done by checking your blood sugar (glucose) after you have not eaten for a while (fasting). You may have this done every 1-3 years. Bone density scan. This is done to screen for osteoporosis. You may have this done starting at age 57. Mammogram. This may be done every 1-2 years. Talk to your health care provider about how often you should have regular mammograms. Talk with your health care provider about your test results, treatment options, and if necessary, the need for more tests. Vaccines  Your health care provider may recommend certain vaccines, such as: Influenza vaccine. This is recommended every year. Tetanus, diphtheria, and acellular pertussis (Tdap, Td) vaccine. You may need a Td booster every 10 years. Zoster vaccine. You may need this after age 27. Pneumococcal 13-valent conjugate (PCV13) vaccine. One dose is recommended after age 50. Pneumococcal polysaccharide (PPSV23) vaccine. One dose is recommended after age 29. Talk to your health care provider about which screenings and vaccines you  need and how often you need  them. This information is not intended to replace advice given to you by your health care provider. Make sure you discuss any questions you have with your health care provider. Document Released: 05/13/2015 Document Revised: 01/04/2016 Document Reviewed: 02/15/2015 Elsevier Interactive Patient Education  2017 [place] Prevention in the Home Falls can cause injuries. They can happen to people of all ages. There are many things you can do to make your home safe and to help prevent falls. What can I do on the outside of my home? Regularly fix the edges of walkways and driveways and fix any cracks. Remove anything that might make you trip as you walk through a door, such as a raised step or threshold. Trim any bushes or trees on the path to your home. Use bright outdoor lighting. Clear any walking paths of anything that might make someone trip, such as rocks or tools. Regularly check to see if handrails are loose or broken. Make sure that both sides of any steps have handrails. Any raised decks and porches should have guardrails on the edges. Have any leaves, snow, or ice cleared regularly. Use sand or salt on walking paths during winter. Clean up any spills in your garage right away. This includes oil or grease spills. What can I do in the bathroom? Use night lights. Install grab bars by the toilet and in the tub and shower. Do not use towel bars as grab bars. Use non-skid mats or decals in the tub or shower. If you need to sit down in the shower, use a plastic, non-slip stool. Keep the floor dry. Clean up any water that spills on the floor as soon as it happens. Remove soap buildup in the tub or shower regularly. Attach bath mats securely with double-sided non-slip rug tape. Do not have throw rugs and other things on the floor that can make you trip. What can I do in the bedroom? Use night lights. Make sure that you have a light by your bed that is easy to reach. Do not use  any sheets or blankets that are too big for your bed. They should not hang down onto the floor. Have a firm chair that has side arms. You can use this for support while you get dressed. Do not have throw rugs and other things on the floor that can make you trip. What can I do in the kitchen? Clean up any spills right away. Avoid walking on wet floors. Keep items that you use a lot in easy-to-reach places. If you need to reach something above you, use a strong step stool that has a grab bar. Keep electrical cords out of the way. Do not use floor polish or wax that makes floors slippery. If you must use wax, use non-skid floor wax. Do not have throw rugs and other things on the floor that can make you trip. What can I do with my stairs? Do not leave any items on the stairs. Make sure that there are handrails on both sides of the stairs and use them. Fix handrails that are broken or loose. Make sure that handrails are as long as the stairways. Check any carpeting to make sure that it is firmly attached to the stairs. Fix any carpet that is loose or worn. Avoid having throw rugs at the top or bottom of the stairs. If you do have throw rugs, attach them to the floor with carpet tape. Make sure that  you have a light switch at the top of the stairs and the bottom of the stairs. If you do not have them, ask someone to add them for you. What else can I do to help prevent falls? Wear shoes that: Do not have high heels. Have rubber bottoms. Are comfortable and fit you well. Are closed at the toe. Do not wear sandals. If you use a stepladder: Make sure that it is fully opened. Do not climb a closed stepladder. Make sure that both sides of the stepladder are locked into place. Ask someone to hold it for you, if possible. Clearly mark and make sure that you can see: Any grab bars or handrails. First and last steps. Where the edge of each step is. Use tools that help you move around (mobility aids)  if they are needed. These include: Canes. Walkers. Scooters. Crutches. Turn on the lights when you go into a dark area. Replace any light bulbs as soon as they burn out. Set up your furniture so you have a clear path. Avoid moving your furniture around. If any of your floors are uneven, fix them. If there are any pets around you, be aware of where they are. Review your medicines with your doctor. Some medicines can make you feel dizzy. This can increase your chance of falling. Ask your doctor what other things that you can do to help prevent falls. This information is not intended to replace advice given to you by your health care provider. Make sure you discuss any questions you have with your health care provider. Document Released: 02/10/2009 Document Revised: 09/22/2015 Document Reviewed: 05/21/2014 Elsevier Interactive Patient Education  2017 [employer].

## 2021-04-12 NOTE — Progress Notes (Signed)
Subjective:   Jennifer Saunders is a 70 y.o. female who presents for Medicare Annual (Subsequent) preventive examination.   I connected with [name] today by telephone and verified that I am speaking with the correct person using two identifiers. Location patient: home Location provider: work Persons participating in the virtual visit: patient, provider.   I discussed the limitations, risks, security and privacy concerns of performing an evaluation and management service by telephone and the availability of in person appointments. I also discussed with the patient that there may be a patient responsible charge related to this service. The patient expressed understanding and verbally consented to this telephonic visit.    Interactive audio and video telecommunications were attempted between this provider and patient, however failed, due to patient having technical difficulties OR patient did not have access to video capability.  We continued and completed visit with audio only.    Review of Systems     Cardiac Risk Factors include: advanced age (>51mn, >>70women);diabetes mellitus;dyslipidemia;hypertension     Objective:    Today's Vitals   There is no height or weight on file to calculate BMI.  Advanced Directives 04/12/2021 03/28/2020 06/13/2019 03/17/2019 03/12/2018 10/22/2016 04/10/2016  Does Patient Have a Medical Advance Directive? Yes Yes No Yes Yes No No  Type of AParamedicof [place]alamaLiving will Living will - Living will;Healthcare Power of Attorney Living will - -  Copy of HE[place]in Chart? No - copy requested - - No - copy requested - - -    Current Medications (verified) Outpatient Encounter Medications as of 04/12/2021  Medication Sig   Alcohol Swabs PADS Use daily before blood glucose   amLODipine (NORVASC) 5 MG tablet TAKE 1 TABLET EVERY DAY   aspirin 81 MG tablet Take 81 mg by mouth daily.   Blood Glucose  Calibration (TRUE METRIX LEVEL 2) Normal SOLN Use daily to check blood glucose controls Dx: E11.9   Blood Glucose Monitoring Suppl (TRUE METRIX METER) w/Device KIT Check blood glucose once daily Dx: E11.9   Calcium Carbonate-Vit D-Min (CALCIUM 1200) 1200-1000 MG-UNIT CHEW Chew 1 tablet by mouth daily.   carvedilol (COREG) 12.5 MG tablet Take 1 tablet (12.5 mg total) by mouth 2 (two) times daily with a meal.   clotrimazole-betamethasone (LOTRISONE) cream Apply 1 application topically 2 (two) times daily. For up to 2 weeks.   glucose blood (TRUE METRIX BLOOD GLUCOSE TEST) test strip Check blood glucose daily. Dx: E11.9   losartan-hydrochlorothiazide (HYZAAR) 100-25 MG tablet TAKE 1 TABLET EVERY DAY   metFORMIN (GLUCOPHAGE-XR) 500 MG 24 hr tablet Take 2 tablets (1,000 mg total) by mouth 2 (two) times daily.   TRUEplus Lancets 33G MISC Check blood glucose daily Dx: E11.9   No facility-administered encounter medications on file as of 04/12/2021.    Allergies (verified) Patient has no known allergies.   History: Past Medical History:  Diagnosis Date   Adenomatous colon polyp 2003   Allergy    seasonal   Anxiety    Diabetes mellitus    Dyslipidemia    GERD (gastroesophageal reflux disease)    Hemorrhoids    hx of   Hypertension    Urolithiasis    Past Surgical History:  Procedure Laterality Date   COLONOSCOPY     Family History  Problem Relation Age of Onset   Cancer Sister        Colon   Intracerebral hemorrhage Sister    Colon cancer Sister 517  Cancer Mother        Breast   Heart disease Mother        CVA x 2   Breast cancer Mother 5   Diabetes Maternal Aunt    Diabetes Paternal Aunt    Aneurysm Sister    Hypertension Sister    Heart disease Sister    Social History   Socioeconomic History   Marital status: Divorced    Spouse name: Not on file   Number of children: Not on file   Years of education: Not on file   Highest education level: Not on file   Occupational History   Occupation: Retired  Tobacco Use   Smoking status: Former   Smokeless tobacco: Never   Tobacco comments:    quit age 25  Vaping Use   Vaping Use: Never used  Substance and Sexual Activity   Alcohol use: No    Alcohol/week: 0.0 standard drinks   Drug use: No   Sexual activity: Not Currently  Other Topics Concern   Not on file  Social History Narrative   Not on file   Social Determinants of Health   Financial Resource Strain: Low Risk    Difficulty of Paying Living Expenses: Not hard at all  Food Insecurity: No Food Insecurity   Worried About Charity fundraiser in the Last Year: Never true   [place] in the Last Year: Never true  Transportation Needs: No Transportation Needs   Lack of Transportation (Medical): No   Lack of Transportation (Non-Medical): No  Physical Activity: Insufficiently Active   Days of Exercise per Week: 2 days   Minutes of Exercise per Session: 20 min  Stress: No Stress Concern Present   Feeling of Stress : Not at all  Social Connections: Moderately Integrated   Frequency of Communication with Friends and Family: Twice a week   Frequency of Social Gatherings with Friends and Family: Twice a week   Attends Religious Services: More than 4 times per year   Active Member of [employer] or Organizations: Yes   Attends Archivist Meetings: 1 to 4 times per year   Marital Status: Divorced    Tobacco Counseling Counseling given: Not Answered Tobacco comments: quit age 48   Clinical Intake:  Pre-visit preparation completed: Yes  Pain : No/denies pain     Nutritional Risks: None Diabetes: Yes CBG done?: No Did pt. bring in CBG monitor from home?: No  How often do you need to have someone help you when you read instructions, pamphlets, or other written materials from your doctor or pharmacy?: 1 - Never What is the last grade level you completed in school?: High School  Diabetic?yes Nutrition Risk  Assessment:  Has the patient had any N/V/D within the last 2 months?  No  Does the patient have any non-healing wounds?  No  Has the patient had any unintentional weight loss or weight gain?  No   Diabetes:  Is the patient diabetic?  Yes  If diabetic, was a CBG obtained today?  No  Did the patient bring in their glucometer from home?  No  How often do you monitor your CBG's? Daily .   Financial Strains and Diabetes Management:  Are you having any financial strains with the device, your supplies or your medication? No .  Does the patient want to be seen by Chronic Care Management for management of their diabetes?  No  Would the patient like to be referred to  a Nutritionist or for Diabetic Management?  No   Diabetic Exams:  Diabetic Eye Exam: Completed 11/2020 Diabetic Foot Exam: Overdue, Pt has been advised about the importance in completing this exam. Pt is scheduled for diabetic foot exam on next office visit .   Interpreter Needed?: No  Information entered by :: [place] of Daily Living In your present state of health, do you have any difficulty performing the following activities: 04/12/2021  Hearing? N  Vision? N  Difficulty concentrating or making decisions? N  Walking or climbing stairs? N  Dressing or bathing? N  Doing errands, shopping? N  Preparing Food and eating ? N  Using the Toilet? N  In the past six months, have you accidently leaked urine? N  Do you have problems with loss of bowel control? N  Managing your Medications? N  Managing your Finances? N  Housekeeping or managing your Housekeeping? N  Some recent data might be hidden    Patient Care Team: Haydee Salter, MD as PCP - General (Family Medicine) Renato Shin, MD as Consulting Physician (Endocrinology) Thelma Comp, OD (Optometry)  Indicate any recent Medical Services you may have received from other than [facility] providers in the past year (date may be approximate).      Assessment:   This is a routine wellness examination for [place].  Hearing/Vision screen Vision Screening - Comments:: Annual eye exams   Dietary issues and exercise activities discussed: Current Exercise Habits: Home exercise routine, Type of exercise: walking, Time (Minutes): 20, Frequency (Times/Week): 3, Weekly Exercise (Minutes/Week): 60, Intensity: Mild, Exercise limited by: None identified   Goals Addressed             This Visit's Progress    Patient Stated   On track    Increase activity & eat more fruits & vegetable       Depression Screen PHQ 2/9 Scores 04/12/2021 04/12/2021 08/19/2020 03/28/2020 03/17/2019 05/06/2018 03/12/2018  PHQ - 2 Score 0 0 0 0 0 0 0  PHQ- 9 Score - - - - - 0 -    Fall Risk Fall Risk  04/12/2021 03/28/2020 03/17/2019 03/17/2019 05/06/2018  Falls in the past year? 0 1 0 0 0  Number falls in past yr: 0 0 0 0 0  Injury with Fall? 0 0 - 0 0  Follow up Falls evaluation completed Falls prevention discussed - Falls evaluation completed Falls evaluation completed    [place]:  Any stairs in or around the home? No  If so, are there any without handrails? No  Home free of loose throw rugs in walkways, pet beds, electrical cords, etc? Yes  Adequate lighting in your home to reduce risk of falls? Yes   ASSISTIVE DEVICES UTILIZED TO PREVENT FALLS:  Life alert? No  Use of a cane, walker or w/c? No  Grab bars in the bathroom? Yes  Shower chair or bench in shower? Yes  Elevated toilet seat or a handicapped toilet? Yes    Cognitive Function:Normal cognitive status assessed by direct observation by this Nurse Health Advisor. No abnormalities found.   MMSE - Mini Mental State Exam 03/17/2019 03/12/2018  Orientation to time 5 5  Orientation to Place 5 5  Registration 3 3  Attention/ Calculation 5 5  Recall 2 0  Language- name 2 objects 2 2  Language- repeat 1 1  Language- follow 3 step command 3 3  Language-  read & follow direction 1 1  Write a sentence 1 1  Copy design 1 1  Total score 29 27        Immunizations Immunization History  Administered Date(s) Administered   Influenza, High Dose Seasonal PF 02/25/2014, 03/03/2018, 02/05/2019   Influenza,inj,Quad PF,6+ Mos 03/07/2017   Influenza-Unspecified 01/29/2016, 01/29/2020, 02/14/2021   PFIZER(Purple Top)SARS-COV-2 Vaccination 07/11/2019, 08/04/2019, 04/25/2020   Pneumococcal Conjugate-13 01/15/2014   Pneumococcal Polysaccharide-23 05/24/2009, 05/30/2016   Td 08/28/1997   Tdap 01/15/2014    TDAP status: Up to date  Flu Vaccine status: Up to date  Pneumococcal vaccine status: Up to date  Covid-19 vaccine status: Completed vaccines  Qualifies for Shingles Vaccine? Yes   Zostavax completed No   Shingrix Completed?: No.    Education has been provided regarding the importance of this vaccine. Patient has been advised to call insurance company to determine out of pocket expense if they have not yet received this vaccine. Advised may also receive vaccine at local pharmacy or Health Dept. Verbalized acceptance and understanding.  Screening Tests Health Maintenance  Topic Date Due   Zoster Vaccines- Shingrix (1 of 2) Never done   COVID-19 Vaccine (4 - Booster for Pfizer series) 06/20/2020   MAMMOGRAM  04/19/2021   HEMOGLOBIN A1C  08/21/2021   OPHTHALMOLOGY EXAM  12/29/2021   FOOT EXAM  02/20/2022   COLONOSCOPY (Pts 45-72yr Insurance coverage will need to be confirmed)  11/14/2022   TETANUS/TDAP  01/16/2024   Pneumonia Vaccine 72 Years old  Completed   INFLUENZA VACCINE  Completed   DEXA SCAN  Completed   Hepatitis C Screening  Completed   HPV VACCINES  Aged Out    Health Maintenance  Health Maintenance Due  Topic Date Due   Zoster Vaccines- Shingrix (1 of 2) Never done   COVID-19 Vaccine (4 - Booster for Pfizer series) 06/20/2020    Colorectal cancer screening: Type of screening: Colonoscopy. Completed 11/13/2017.  Repeat every 5 years  Mammogram status: Completed 04/20/2021. Repeat every year  Bone Density status: Completed 05/20/2020. Results reflect: Bone density results: OSTEOPENIA. Repeat every 5 years.  Lung Cancer Screening: (Low Dose CT Chest recommended if Age 72-80years, 30 pack-year currently smoking OR have quit w/in 15years.) does not qualify.   Lung Cancer Screening Referral: n/a  Additional Screening:  Hepatitis C Screening: does not qualify; Completed 04/10/2016  Vision Screening: Recommended annual ophthalmology exams for early detection of glaucoma and other disorders of the eye. Is the patient up to date with their annual eye exam?  Yes  Who is the provider or what is the name of the office in which the patient [facility]ends annual [facility] exams[facility] If pt is not established with a provider, would they like to be referred to a provider to establish care? No .   Dental Screening: Recommended annual dental exams for proper oral hygiene  Community Resource Referral / Chronic Care Management: CRR required this visit?  No   CCM required this visit?  No      Plan:     I have personally reviewed and noted the following in the patients chart:   Medical and social history Use of alcohol, tobacco or illicit drugs  Current medications and supplements including opioid prescriptions.  Functional ability and status Nutritional status Physical activity Advanced directives List of other physicians Hospitalizations, surgeries, and ER visits in previous 12 months Vitals Screenings to include cognitive, depression, and falls Referrals and appointments  In addition, I have reviewed and discussed with patient certain preventive  protocols, quality metrics, and best practice recommendations. A written personalized care plan for preventive services as well as general preventive health recommendations were provided to patient.     Randel Pigg, LPN   [id number]   Nurse  Notes: none

## 2021-04-17 ENCOUNTER — Telehealth: Payer: Self-pay | Admitting: Family Medicine

## 2021-04-17 NOTE — Telephone Encounter (Signed)
Corrected and notified pt

## 2021-04-17 NOTE — Telephone Encounter (Signed)
Pt called to say she had her phone call appointment on 12/14.. but it is marked no show. (It was a medicare well visit with Mickel Baas).

## 2021-04-20 ENCOUNTER — Ambulatory Visit
Admission: RE | Admit: 2021-04-20 | Discharge: 2021-04-20 | Disposition: A | Discharge status: Home / Self Care | Payer: Medicare HMO | Source: Ambulatory Visit | Attending: Family Medicine | Admitting: Family Medicine

## 2021-04-20 DIAGNOSIS — Z1231 Encounter for screening mammogram for malignant neoplasm of breast: Secondary | ICD-10-CM

## 2021-05-24 ENCOUNTER — Other Ambulatory Visit: Payer: Self-pay

## 2021-05-24 ENCOUNTER — Ambulatory Visit (INDEPENDENT_AMBULATORY_CARE_PROVIDER_SITE_OTHER): Payer: Medicare HMO | Admitting: Family Medicine

## 2021-05-24 VITALS — BP 140/76 | HR 92 | Temp 97.6°F | Ht 61.0 in | Wt 216.0 lb

## 2021-05-24 DIAGNOSIS — I679 Cerebrovascular disease, unspecified: Secondary | ICD-10-CM

## 2021-05-24 DIAGNOSIS — E1169 Type 2 diabetes mellitus with other specified complication: Secondary | ICD-10-CM | POA: Diagnosis not present

## 2021-05-24 DIAGNOSIS — I10 Essential (primary) hypertension: Secondary | ICD-10-CM | POA: Diagnosis not present

## 2021-05-24 DIAGNOSIS — Z6841 Body Mass Index (BMI) 40.0 and over, adult: Secondary | ICD-10-CM | POA: Diagnosis not present

## 2021-05-24 LAB — HEMOGLOBIN A1C: Hgb A1c MFr Bld: 6.3 % (ref 4.6–6.5)

## 2021-05-24 LAB — LIPID PANEL
Cholesterol: 108 mg/dL (ref 0–200)
HDL: 36.1 mg/dL — ABNORMAL LOW (ref >39.00)
LDL Cholesterol: 54 mg/dL (ref 0–99)
NonHDL: 71.57
Total CHOL/HDL Ratio: 3
Triglycerides: 90 mg/dL (ref 0.0–149.0)
VLDL: 18 mg/dL (ref 0.0–40.0)

## 2021-05-24 LAB — GLUCOSE, RANDOM: Glucose, Bld: 114 mg/dL — ABNORMAL HIGH (ref 70–99)

## 2021-05-24 NOTE — Progress Notes (Signed)
[facility] [address] [place] [address] Dept: [phone] Dept Fax: [phone]  Chronic Care Office Visit  Subjective:    Patient ID: Jennifer Saunders, female    DOB: 1950/10/23, 71 y.o..   MRN: [id number]  Chief Complaint  Patient presents with   Follow-up    3 month f/u HTN/DM.   No concerns. Fasting today.      History of Present Illness:  Patient is in today for reassessment of chronic medical issues.  Jennifer Saunders has a history of Type 2 diabetes managed with metformin. She notes that Dr. Loanne Drilling has released her to have her PCP follow her for her diabetes. She notes that she has been workign at weight loss and was pleased to see this down some. She has stop purchasing sodas and chips, increased her intake fo fresh fruits, and is going for walks on clear days. She notes that walking the circuit  7 times around two churches she attends is about 1 mile. She has been making 4 circuits on clear days.  Jennifer Saunders has hypertension managed with Hyzaar, amlodipine, and carvedilol.   Although she has cerebrovascular disease and hyperlipidemia, her lipids have been in excellent control without use of a statin. She does take a daily aspirin for secondary prevention.   Past Medical History: Patient Active Problem List   Diagnosis Date Noted   COVID-19 08/19/2020   Osteopenia 08/19/2020   Neck pain on right side 06/02/2016   NASH (nonalcoholic steatohepatitis) 04/11/2016   History of adenomatous polyp of colon 10/17/2015   Morbid obesity with BMI of 40.0-44.9, adult ([place]) 10/17/2015   GERD (gastroesophageal reflux disease) 08/15/2015   Undiagnosed cardiac murmurs 12/17/2012   Varicose veins 08/18/2010   Cerebrovascular disease- small vessel 05/17/2008   Hyperlipidemia 03/18/2008   History of kidney stones 03/18/2008   History of shingles 04/25/2007   Type 2 diabetes mellitus with other specified complication ([place])  43/83/8184   Anxiety state 12/28/2006   Essential hypertension 12/28/2006   Past Surgical History:  Procedure Laterality Date   COLONOSCOPY     Family History  Problem Relation Age of Onset   Cancer Sister        Colon   Intracerebral hemorrhage Sister    Colon cancer Sister 73   Cancer Mother        Breast   Heart disease Mother        CVA x 2   Breast cancer Mother 32   Diabetes Maternal Aunt    Diabetes Paternal Aunt    Aneurysm Sister    Hypertension Sister    Heart disease Sister    Outpatient Medications Prior to Visit  Medication Sig Dispense Refill   Alcohol Swabs PADS Use daily before blood glucose 100 each 3   amLODipine (NORVASC) 5 MG tablet TAKE 1 TABLET EVERY DAY 90 tablet 0   aspirin 81 MG tablet Take 81 mg by mouth daily.     Blood Glucose Calibration (TRUE METRIX LEVEL 2) Normal SOLN Use daily to check blood glucose controls Dx: E11.9 1 each 2   Blood Glucose Monitoring Suppl (TRUE METRIX METER) w/Device KIT Check blood glucose once daily Dx: E11.9 1 kit 0   Calcium Carbonate-Vit D-Min (CALCIUM 1200) 1200-1000 MG-UNIT CHEW Chew 1 tablet by mouth daily. 30 tablet 0   carvedilol (COREG) 12.5 MG tablet Take 1 tablet (12.5 mg total) by mouth 2 (two) times daily with a meal. 180 tablet 3  cholecalciferol (VITAMIN D3) 25 MCG (1000 UNIT) tablet Take 1,000 Units by mouth daily.     clotrimazole-betamethasone (LOTRISONE) cream Apply 1 application topically 2 (two) times daily. For up to 2 weeks. 30 g 0   glucose blood (TRUE METRIX BLOOD GLUCOSE TEST) test strip Check blood glucose daily. Dx: E11.9 100 each 12   losartan-hydrochlorothiazide (HYZAAR) 100-25 MG tablet TAKE 1 TABLET EVERY DAY 90 tablet 3   metFORMIN (GLUCOPHAGE-XR) 500 MG 24 hr tablet Take 2 tablets (1,000 mg total) by mouth 2 (two) times daily. 360 tablet 1   TRUEplus Lancets 33G MISC Check blood glucose daily Dx: E11.9 100 each 3   No facility-administered medications prior to visit.   No Known  Allergies    Objective:   Today's Vitals   05/24/21 0825  BP: 140/76  Pulse: 92  Temp: 97.6 F (36.4 C)  TempSrc: Temporal  SpO2: 94%  Weight: 216 lb (98 kg)  Height: 5' 1"  (1.549 m)   Body mass index is 40.81 kg/m.   General: Well developed, well nourished. No acute distress. Psych: Alert and oriented. Normal mood and affect.  Health Maintenance Due  Topic Date Due   Zoster Vaccines- Shingrix (1 of 2) Never done   COVID-19 Vaccine (4 - Booster for Pfizer series) 06/20/2020   Lab Results Last lipids Lab Results  Component Value Date   CHOL 102 08/22/2020   HDL 36.70 (L) 08/22/2020   LDLCALC 46 08/22/2020   TRIG 99.0 08/22/2020   CHOLHDL 3 08/22/2020   Last hemoglobin A1c Lab Results  Component Value Date   HGBA1C 6.4 02/20/2021   Assessment & Plan:   1. Type 2 diabetes mellitus with other specified complication, without long-term current use of insulin (HCC) Continue metformin. I will check quarterly labs + lipids today to assess current status of her diabetes.  - Glucose, random - Hemoglobin A1c - Lipid panel  2. Cerebrovascular disease- small vessel Continue aspirin for secondary prevention.  3. Essential hypertension Jennifer Saunders's systolic blood pressure is mildly high today. I will reassess in 3 months. If this remains high, we may need to adjust up on her meds.  4. Morbid obesity with BMI of 40.0-44.9, adult [facility]) Congratulated her on the lifestyle changes she is making. Encouraged her to continue these efforts.  Haydee Salter, MD

## 2021-05-31 ENCOUNTER — Other Ambulatory Visit: Payer: Self-pay | Admitting: Family Medicine

## 2021-05-31 DIAGNOSIS — I10 Essential (primary) hypertension: Secondary | ICD-10-CM

## 2021-06-19 ENCOUNTER — Other Ambulatory Visit: Payer: Self-pay | Admitting: Family Medicine

## 2021-06-19 DIAGNOSIS — E119 Type 2 diabetes mellitus without complications: Secondary | ICD-10-CM

## 2021-08-24 ENCOUNTER — Ambulatory Visit (INDEPENDENT_AMBULATORY_CARE_PROVIDER_SITE_OTHER): Payer: Medicare HMO | Admitting: Family Medicine

## 2021-08-24 VITALS — BP 134/80 | HR 72 | Temp 97.0°F | Ht 61.0 in | Wt 212.0 lb

## 2021-08-24 DIAGNOSIS — E785 Hyperlipidemia, unspecified: Secondary | ICD-10-CM | POA: Diagnosis not present

## 2021-08-24 DIAGNOSIS — I10 Essential (primary) hypertension: Secondary | ICD-10-CM | POA: Diagnosis not present

## 2021-08-24 DIAGNOSIS — E1169 Type 2 diabetes mellitus with other specified complication: Secondary | ICD-10-CM

## 2021-08-24 DIAGNOSIS — L309 Dermatitis, unspecified: Secondary | ICD-10-CM | POA: Diagnosis not present

## 2021-08-24 LAB — BASIC METABOLIC PANEL
BUN: 12 mg/dL (ref 6–23)
CO2: 33 mEq/L — ABNORMAL HIGH (ref 19–32)
Calcium: 10.4 mg/dL (ref 8.4–10.5)
Chloride: 104 mEq/L (ref 96–112)
Creatinine, Ser: 0.85 mg/dL (ref 0.40–1.20)
GFR: 68.27 mL/min (ref >60.00)
Glucose, Bld: 112 mg/dL — ABNORMAL HIGH (ref 70–99)
Potassium: 4.6 mEq/L (ref 3.5–5.1)
Sodium: 145 mEq/L (ref 135–145)

## 2021-08-24 LAB — URINALYSIS, ROUTINE W REFLEX MICROSCOPIC
Bilirubin Urine: NEGATIVE
Hgb urine dipstick: NEGATIVE
Ketones, ur: NEGATIVE
Leukocytes,Ua: NEGATIVE
Nitrite: NEGATIVE
Specific Gravity, Urine: 1.015 (ref 1.000–1.030)
Total Protein, Urine: NEGATIVE
Urine Glucose: NEGATIVE
Urobilinogen, UA: 0.2 (ref 0.0–1.0)
pH: 7.5 (ref 5.0–8.0)

## 2021-08-24 LAB — MICROALBUMIN / CREATININE URINE RATIO
Creatinine,U: 100.8 mg/dL
Microalb Creat Ratio: 0.7 mg/g (ref 0.0–30.0)
Microalb, Ur: <0.7 mg/dL (ref 0.0–1.9)

## 2021-08-24 LAB — HEMOGLOBIN A1C: Hgb A1c MFr Bld: 6.3 % (ref 4.6–6.5)

## 2021-08-24 MED ORDER — CLOTRIMAZOLE-BETAMETHASONE 1-0.05 % EX CREA: 1.0000 "application " | TOPICAL_CREAM | Freq: Two times a day (BID) | CUTANEOUS | 0 refills | Status: DC

## 2021-08-24 NOTE — Progress Notes (Signed)
[facility] [address] [place] [address] Dept: [phone] Dept Fax: [phone]  Chronic Care Office Visit  Subjective:    Patient ID: Jennifer Saunders, female    DOB: 1951-01-27, 71 y.o..   MRN: [id number]  Chief Complaint  Patient presents with   Follow-up    3 month f/u. No concerns  fasting today.     History of Present Illness:  Patient is in today for reassessment of chronic medical issues.  Ms. Giebler has a history of Type 2 diabetes managed with metformin. She continues to work at weight loss through diet and exercise.   Ms. Faires has hypertension managed with Hyzaar, amlodipine, and carvedilol.    Although she has cerebrovascular disease and hyperlipidemia, her lipids have been in excellent control without use of a statin. She does take a daily aspirin for secondary prevention.  Ms. Rarick request a refill of Lotrisone. She notes she gets occasional skin lesions, such as on her legs. These seem to heal more quickly when she uses this cream. She does not have any lesions today.  Past Medical History: Patient Active Problem List   Diagnosis Date Noted   COVID-19 08/19/2020   Osteopenia 08/19/2020   Neck pain on right side 06/02/2016   NASH (nonalcoholic steatohepatitis) 04/11/2016   History of adenomatous polyp of colon 10/17/2015   Morbid obesity with BMI of 40.0-44.9, adult ([place]) 10/17/2015   GERD (gastroesophageal reflux disease) 08/15/2015   Undiagnosed cardiac murmurs 12/17/2012   Varicose veins 08/18/2010   Cerebrovascular disease- small vessel 05/17/2008   Hyperlipidemia 03/18/2008   History of kidney stones 03/18/2008   History of shingles 04/25/2007   Type 2 diabetes mellitus with other specified complication ([place]) 55/37/4827   Anxiety state 12/28/2006   Essential hypertension 12/28/2006   Past Surgical History:  Procedure Laterality Date   COLONOSCOPY     Family History   Problem Relation Age of Onset   Cancer Sister        Colon   Intracerebral hemorrhage Sister    Colon cancer Sister 45   Cancer Mother        Breast   Heart disease Mother        CVA x 2   Breast cancer Mother 92   Diabetes Maternal Aunt    Diabetes Paternal Aunt    Aneurysm Sister    Hypertension Sister    Heart disease Sister    Outpatient Medications Prior to Visit  Medication Sig Dispense Refill   Alcohol Swabs PADS Use daily before blood glucose 100 each 3   amLODipine (NORVASC) 5 MG tablet TAKE 1 TABLET EVERY DAY 90 tablet 0   aspirin 81 MG tablet Take 81 mg by mouth daily.     Blood Glucose Calibration (TRUE METRIX LEVEL 2) Normal SOLN Use daily to check blood glucose controls Dx: E11.9 1 each 2   Blood Glucose Monitoring Suppl (TRUE METRIX METER) w/Device KIT Check blood glucose once daily Dx: E11.9 1 kit 0   Calcium Carbonate-Vit D-Min (CALCIUM 1200) 1200-1000 MG-UNIT CHEW Chew 1 tablet by mouth daily. 30 tablet 0   carvedilol (COREG) 12.5 MG tablet Take 1 tablet (12.5 mg total) by mouth 2 (two) times daily with a meal. 180 tablet 3   cholecalciferol (VITAMIN D3) 25 MCG (1000 UNIT) tablet Take 1,000 Units by mouth daily.     glucose blood (TRUE METRIX BLOOD GLUCOSE TEST) test strip Check blood glucose daily. Dx: E11.9 100 each 12  losartan-hydrochlorothiazide (HYZAAR) 100-25 MG tablet TAKE 1 TABLET EVERY DAY 90 tablet 3   metFORMIN (GLUCOPHAGE-XR) 500 MG 24 hr tablet TAKE 2 TABLETS (1,000 MG TOTAL) BY MOUTH 2 (TWO) TIMES DAILY. 360 tablet 1   TRUEplus Lancets 33G MISC Check blood glucose daily Dx: E11.9 100 each 3   clotrimazole-betamethasone (LOTRISONE) cream Apply 1 application topically 2 (two) times daily. For up to 2 weeks. 30 g 0   No facility-administered medications prior to visit.   No Known Allergies    Objective:   Today's Vitals   08/24/21 0909  BP: 134/80  Pulse: 72  Temp: (!) 97 F (36.1 C)  TempSrc: Temporal  SpO2: 96%  Weight: 212 lb (96.2 kg)   Height: _0  (1.549 m)   Body mass index is 40.06 kg/m.   General: Well developed, well nourished. No acute distress. Psych: Alert and oriented. Normal mood and affect.  Health Maintenance Due  Topic Date Due   Zoster Vaccines- Shingrix (1 of 2) Never done   Lab Results Last lipids Lab Results  Component Value Date   CHOL 108 05/24/2021   HDL 36.10 (L) 05/24/2021   LDLCALC 54 05/24/2021   TRIG 90.0 05/24/2021   CHOLHDL 3 05/24/2021   Last hemoglobin A1c Lab Results  Component Value Date   HGBA1C 6.3 05/24/2021   Assessment & Plan:   1. Type 2 diabetes mellitus with other specified complication, without long-term current use of insulin (HCC) We are due to check annual DM labs today. Ms. Hutmacher diabetes has been in excellent control on metformin XR  500 mg 2 tabs daily.  - Microalbumin / creatinine urine ratio - Basic metabolic panel - Hemoglobin A1c - Urinalysis, Routine w reflex microscopic  2. Essential hypertension Blood pressure is in acceptable control. She will continue amlodipine, carvedilol, and Hyzaar. We willc heck renal function today.  - Basic metabolic panel  3. Hyperlipidemia, unspecified hyperlipidemia type Last lipids show a very low cholesterol and LDL, but also with a low HDL. Although guidelines would recommend she be on a statin, her cholesterol is already so low that I think this would potentially be problematic for her.  4. Dermatitis I will renew her Lotrisone for this nonspecific dermatitis.  - clotrimazole-betamethasone (LOTRISONE) cream; Apply 1 application. topically 2 (two) times daily. For up to 2 weeks.  Dispense: 30 g; Refill: 0   Return in about 3 months (around 11/23/2021) for Reassessment.   Haydee Salter, MD

## 2021-09-26 ENCOUNTER — Other Ambulatory Visit: Payer: Self-pay | Admitting: Family Medicine

## 2021-09-26 DIAGNOSIS — I10 Essential (primary) hypertension: Secondary | ICD-10-CM

## 2021-10-25 ENCOUNTER — Other Ambulatory Visit: Payer: Self-pay | Admitting: Family Medicine

## 2021-10-25 DIAGNOSIS — I10 Essential (primary) hypertension: Secondary | ICD-10-CM

## 2021-11-23 ENCOUNTER — Ambulatory Visit (INDEPENDENT_AMBULATORY_CARE_PROVIDER_SITE_OTHER): Payer: Medicare HMO | Admitting: Family Medicine

## 2021-11-23 ENCOUNTER — Encounter: Payer: Self-pay | Admitting: Family Medicine

## 2021-11-23 VITALS — BP 118/76 | HR 79 | Temp 96.7°F | Ht 61.0 in | Wt 206.6 lb

## 2021-11-23 DIAGNOSIS — M67442 Ganglion, left hand: Secondary | ICD-10-CM | POA: Diagnosis not present

## 2021-11-23 DIAGNOSIS — E1169 Type 2 diabetes mellitus with other specified complication: Secondary | ICD-10-CM

## 2021-11-23 DIAGNOSIS — I10 Essential (primary) hypertension: Secondary | ICD-10-CM | POA: Diagnosis not present

## 2021-11-23 DIAGNOSIS — Z6841 Body Mass Index (BMI) 40.0 and over, adult: Secondary | ICD-10-CM

## 2021-11-23 LAB — GLUCOSE, RANDOM: Glucose, Bld: 104 mg/dL — ABNORMAL HIGH (ref 70–99)

## 2021-11-23 LAB — HEMOGLOBIN A1C: Hgb A1c MFr Bld: 6.4 % (ref 4.6–6.5)

## 2021-11-23 NOTE — Progress Notes (Signed)
[facility] [address] [place] [address] Dept: [phone] Dept Fax: [phone]  Chronic Care Office Visit  Subjective:    Patient ID: Jennifer Saunders, female    DOB: Sep 27, 1950, 71 y.o..   MRN: [id number]  Chief Complaint  Patient presents with   Follow-up    3 month f/u.  Fasting today.  Co having a knot on LT wrist.      History of Present Illness:  Patient is in today for reassessment of chronic medical issues.  Jennifer Saunders has a history of Type 2 diabetes managed with metformin 1000 mg bid. She continues to work at weight loss through diet and exercise and is pleased at her progress.   Jennifer Saunders has hypertension managed with Hyzaar 100-25 mg daily, amlodipine 5 mg daily, and carvedilol 12.5 mg bid.    Although she has cerebrovascular disease and hyperlipidemia, her lipids have been in excellent control without use of a statin. She does take a daily aspirin for secondary prevention.  Jennifer Saunders notes she has a cyst on her left hand. She had been told that smacking this with a large book could help it go away. She does not note any pain or tenderness associated with this.  Past Medical History: Patient Active Problem List   Diagnosis Date Noted   COVID-19 08/19/2020   Osteopenia 08/19/2020   Neck pain on right side 06/02/2016   NASH (nonalcoholic steatohepatitis) 04/11/2016   History of adenomatous polyp of colon 10/17/2015   Morbid obesity with BMI of 40.0-44.9, adult ([place]) 10/17/2015   GERD (gastroesophageal reflux disease) 08/15/2015   Undiagnosed cardiac murmurs 12/17/2012   Varicose veins 08/18/2010   Cerebrovascular disease- small vessel 05/17/2008   Hyperlipidemia 03/18/2008   History of kidney stones 03/18/2008   History of shingles 04/25/2007   Type 2 diabetes mellitus with other specified complication ([place]) 80/06/4915   Anxiety state 12/28/2006   Essential hypertension 12/28/2006    Past Surgical History:  Procedure Laterality Date   COLONOSCOPY     Family History  Problem Relation Age of Onset   Cancer Sister        Colon   Intracerebral hemorrhage Sister    Colon cancer Sister 48   Cancer Mother        Breast   Heart disease Mother        CVA x 2   Breast cancer Mother 60   Diabetes Maternal Aunt    Diabetes Paternal Aunt    Aneurysm Sister    Hypertension Sister    Heart disease Sister    Outpatient Medications Prior to Visit  Medication Sig Dispense Refill   Alcohol Swabs PADS Use daily before blood glucose 100 each 3   amLODipine (NORVASC) 5 MG tablet TAKE 1 TABLET EVERY DAY 90 tablet 3   aspirin 81 MG tablet Take 81 mg by mouth daily.     Blood Glucose Calibration (TRUE METRIX LEVEL 2) Normal SOLN Use daily to check blood glucose controls Dx: E11.9 1 each 2   Blood Glucose Monitoring Suppl (TRUE METRIX METER) w/Device KIT Check blood glucose once daily Dx: E11.9 1 kit 0   Calcium Carbonate-Vit D-Min (CALCIUM 1200) 1200-1000 MG-UNIT CHEW Chew 1 tablet by mouth daily. 30 tablet 0   carvedilol (COREG) 12.5 MG tablet TAKE 1 TABLET TWICE DAILY WITH MEALS 180 tablet 1   cholecalciferol (VITAMIN D3) 25 MCG (1000 UNIT) tablet Take 1,000 Units by mouth daily.  clotrimazole-betamethasone (LOTRISONE) cream Apply 1 application. topically 2 (two) times daily. For up to 2 weeks. 30 g 0   glucose blood (TRUE METRIX BLOOD GLUCOSE TEST) test strip Check blood glucose daily. Dx: E11.9 100 each 12   losartan-hydrochlorothiazide (HYZAAR) 100-25 MG tablet TAKE 1 TABLET EVERY DAY 90 tablet 3   metFORMIN (GLUCOPHAGE-XR) 500 MG 24 hr tablet TAKE 2 TABLETS (1,000 MG TOTAL) BY MOUTH 2 (TWO) TIMES DAILY. 360 tablet 1   TRUEplus Lancets 33G MISC Check blood glucose daily Dx: E11.9 100 each 3   No facility-administered medications prior to visit.   No Known Allergies    Objective:   Today's Vitals   11/23/21 1002  BP: 118/76  Pulse: 79  Temp: (!) 96.7 F (35.9 C)   TempSrc: Temporal  SpO2: 96%  Weight: 206 lb 9.6 oz (93.7 kg)  Height: _0  (1.549 m)   Body mass index is 39.04 kg/m.   General: Well developed, well nourished. No acute distress. Extremities: There is a 2-3 mm cystic lesion over the extensor tendon of the left thumb. There is no redness or   pain on palpation. FROM. Psych: Alert and oriented. Normal mood and affect.  Health Maintenance Due  Topic Date Due   Zoster Vaccines- Shingrix (1 of 2) Never done     Assessment & Plan:   1. Type 2 diabetes mellitus with other specified complication, without long-term current use of insulin (HCC) We will reassess Jennifer Saunders's A1c today. her diabetes has been in excellent control. Cotninue Metformin XR 500 mg two tabs bid.  - Glucose, random - Hemoglobin A1c  2. Essential hypertension Blood pressure is in excellent control. Continue Hyzaar 100-25 mg daily, amlodipine 5 mg daily, and carvedilol 12.5 mg bid.  3. Morbid obesity with BMI of 40.0-44.9, adult (HCC) Maximum weight= 232 lb (2018) Current weight loss 28 lbs. Congratulated her on her efforts and urged her to continue to work towards getting her weight under 200 lbs.  4. Ganglion cyst of finger of left hand Discussed benign nature of this. Since it is asymptomatic, recommend we observe this. I do not advise smashing this with a heavy book.  Return in about 3 months (around 02/23/2022) for Reassessment.   Haydee Salter, MD

## 2022-01-12 ENCOUNTER — Ambulatory Visit: Payer: Medicare HMO

## 2022-01-15 ENCOUNTER — Encounter: Payer: Self-pay | Admitting: Pharmacist

## 2022-01-15 NOTE — Progress Notes (Signed)
[facility])                                            [place] Team                                        Statin Quality Measure Assessment    01/15/2022  Jennifer Saunders [birth year] [id number]  I am a [facility] clinical pharmacist that reviews patients for statin quality initiatives.     Per review of chart and payor information, patient has a diagnosis of diabetes but is not currently filling a statin prescription.  This places patient into the SUPD (Statin Use In Patients with Diabetes) measure for CMS.    I could not find any documentation of previous trial of a statin or a history of statin intolerance.   Her ASCVD risk score was 9.6 %.     Component Value Date/Time   CHOL 108 05/24/2021 0848   TRIG 90.0 05/24/2021 0848   HDL 36.10 (L) 05/24/2021 0848   CHOLHDL 3 05/24/2021 0848   VLDL 18.0 05/24/2021 0848   LDLCALC 54 05/24/2021 0848    Please consider ONE of the following recommendations:  Initiate high intensity statin Atorvastatin 60m once daily, #90, 3 refills   Rosuvastatin 277monce daily, #90, 3 refills    Initiate moderate intensity          statin with reduced frequency if prior          statin intolerance 1x weekly, #13, 3 refills   2x weekly, #26, 3 refills   3x weekly, #39, 3 refills    Code for past statin intolerance or  other exclusions (required annually)  Provider Requirements: Associate code during an office visit or telehealth encounter  Drug Induced Myopathy G72.0   Myopathy, unspecified G72.9   Myositis, unspecified M60.9   Rhabdomyolysis M6Y77.41 Alcoholic fatty liver K7O87.8 Cirrhosis of liver K74.69   Prediabetes R73.03    Thank you!  SuCurlene LabrumPharmD CoRose Lodgeharmacist Office: [phone]

## 2022-01-25 DIAGNOSIS — E119 Type 2 diabetes mellitus without complications: Secondary | ICD-10-CM | POA: Diagnosis not present

## 2022-01-25 DIAGNOSIS — H2513 Age-related nuclear cataract, bilateral: Secondary | ICD-10-CM | POA: Diagnosis not present

## 2022-01-25 DIAGNOSIS — H52223 Regular astigmatism, bilateral: Secondary | ICD-10-CM | POA: Diagnosis not present

## 2022-01-25 DIAGNOSIS — H524 Presbyopia: Secondary | ICD-10-CM | POA: Diagnosis not present

## 2022-01-25 LAB — HM DIABETES EYE EXAM

## 2022-02-01 ENCOUNTER — Telehealth: Payer: Self-pay | Admitting: Family Medicine

## 2022-02-01 NOTE — Telephone Encounter (Signed)
Pt brought in a form from [place] to be filled out by Dr. Gena Fray. I left paperwork in folder up front. Please advise pt when ready @ [phone]

## 2022-02-01 NOTE — Telephone Encounter (Signed)
Caller Name: Lawana Hartzell Call back phone #: [phone]  Reason for Call: Pt called in with a fax number. Once completed please fax to [phone] to Jacklynn Ganong or Frederik Schmidt

## 2022-02-01 NOTE — Telephone Encounter (Signed)
Called patient to advised upon reading letter that  she will need to come in to see provider to have him write a letter for exercise.  She declined to schedule sooner than 02/27/22.  Wil hold form till that appointment.   Dm/cma

## 2022-02-19 ENCOUNTER — Other Ambulatory Visit: Payer: Self-pay | Admitting: Family Medicine

## 2022-02-27 ENCOUNTER — Encounter: Payer: Self-pay | Admitting: Family Medicine

## 2022-02-27 ENCOUNTER — Ambulatory Visit (INDEPENDENT_AMBULATORY_CARE_PROVIDER_SITE_OTHER): Payer: Medicare HMO | Admitting: Family Medicine

## 2022-02-27 VITALS — BP 118/74 | HR 77 | Temp 97.0°F | Ht 61.0 in | Wt 202.4 lb

## 2022-02-27 DIAGNOSIS — E1169 Type 2 diabetes mellitus with other specified complication: Secondary | ICD-10-CM | POA: Diagnosis not present

## 2022-02-27 DIAGNOSIS — I10 Essential (primary) hypertension: Secondary | ICD-10-CM | POA: Diagnosis not present

## 2022-02-27 LAB — HEMOGLOBIN A1C: Hgb A1c MFr Bld: 6.2 % (ref 4.6–6.5)

## 2022-02-27 LAB — GLUCOSE, RANDOM: Glucose, Bld: 97 mg/dL (ref 70–99)

## 2022-02-27 NOTE — Progress Notes (Signed)
[facility] [address] [place] [address] Dept: [phone] Dept Fax: [phone]  Chronic Care Office Visit  Subjective:    Patient ID: Jennifer Saunders, female    DOB: [birth year], 71 y.o..   MRN: [id number]  Chief Complaint  Patient presents with   Follow-up    3 month f/u.   Fasting today.  No concerns.      History of Present Illness:  Patient is in today for reassessment of chronic medical issues.  Jennifer Saunders has a history of Type 2 diabetes managed with metformin 1000 mg bid. She continues to work at weight loss through diet and exercise and is pleased at her progress. Jennifer Saunders wants to start using a [place], but they need a clearance for her to do so.   Jennifer Saunders has hypertension managed with Hyzaar 100-25 mg daily, amlodipine 5 mg daily, and carvedilol 12.5 mg bid.    Although she has cerebrovascular disease and hyperlipidemia, her lipids have been in excellent control without use of a statin. Jennifer Saunders notes Dr. Loanne Drilling had placed her on a cholesterol lowering medication in the past. This did not achieve any additional lowering, so the medicine was stopped. She does not want to go back on such a medicine now. She does take a daily aspirin for secondary prevention.  Past Medical History: Patient Active Problem List   Diagnosis Date Noted   COVID-19 08/19/2020   Osteopenia 08/19/2020   Neck pain on right side 06/02/2016   NASH (nonalcoholic steatohepatitis) 04/11/2016   History of adenomatous polyp of colon 10/17/2015   Morbid obesity with BMI of 40.0-44.9, adult ([place]) 10/17/2015   GERD (gastroesophageal reflux disease) 08/15/2015   Undiagnosed cardiac murmurs 12/17/2012   Varicose veins 08/18/2010   Cerebrovascular disease- small vessel 05/17/2008   History of kidney stones 03/18/2008   History of shingles 04/25/2007   Type 2 diabetes mellitus with other specified complication ([place])  70/62/3762   Anxiety state 12/28/2006   Essential hypertension 12/28/2006   Past Surgical History:  Procedure Laterality Date   COLONOSCOPY     Family History  Problem Relation Age of Onset   Cancer Sister        Colon   Intracerebral hemorrhage Sister    Colon cancer Sister 60   Cancer Mother        Breast   Heart disease Mother        CVA x 2   Breast cancer Mother 33   Diabetes Maternal Aunt    Diabetes Paternal Aunt    Aneurysm Sister    Hypertension Sister    Heart disease Sister    Outpatient Medications Prior to Visit  Medication Sig Dispense Refill   Alcohol Swabs PADS Use daily before blood glucose 100 each 3   amLODipine (NORVASC) 5 MG tablet TAKE 1 TABLET EVERY DAY 90 tablet 3   aspirin 81 MG tablet Take 81 mg by mouth daily.     Blood Glucose Calibration (TRUE METRIX LEVEL 2) Normal SOLN Use daily to check blood glucose controls Dx: E11.9 1 each 2   Blood Glucose Monitoring Suppl (TRUE METRIX METER) w/Device KIT Check blood glucose once daily Dx: E11.9 1 kit 0   Calcium Carbonate-Vit D-Min (CALCIUM 1200) 1200-1000 MG-UNIT CHEW Chew 1 tablet by mouth daily. 30 tablet 0   carvedilol (COREG) 12.5 MG tablet TAKE 1 TABLET TWICE DAILY WITH MEALS 180 tablet 1   cholecalciferol (VITAMIN D3) 25 MCG (1000  UNIT) tablet Take 1,000 Units by mouth daily.     clotrimazole-betamethasone (LOTRISONE) cream Apply 1 application. topically 2 (two) times daily. For up to 2 weeks. 30 g 0   glucose blood (TRUE METRIX BLOOD GLUCOSE TEST) test strip Check blood glucose daily. Dx: E11.9 100 each 12   losartan-hydrochlorothiazide (HYZAAR) 100-25 MG tablet TAKE 1 TABLET EVERY DAY 90 tablet 10   metFORMIN (GLUCOPHAGE-XR) 500 MG 24 hr tablet TAKE 2 TABLETS (1,000 MG TOTAL) BY MOUTH 2 (TWO) TIMES DAILY. 360 tablet 1   TRUEplus Lancets 33G MISC Check blood glucose daily Dx: E11.9 100 each 3   No facility-administered medications prior to visit.   No Known Allergies    Objective:   Today's  Vitals   02/27/22 0931  BP: 118/74  Pulse: 77  Temp: (!) 97 F (36.1 C)  TempSrc: Temporal  SpO2: 97%  Weight: 202 lb 6.4 oz (91.8 kg)  Height: _0  (1.549 m)   Body mass index is 38.24 kg/m.   General: Well developed, well nourished. No acute distress. Feet- Skin intact. No sign of maceration between toes. Nails are normal. Dorsalis pedis and posterior   tibial artery pulses are normal. 5.07 monofilament testing normal. Psych: Alert and oriented. Normal mood and affect.  Health Maintenance Due  Topic Date Due   Zoster Vaccines- Shingrix (1 of 2) Never done   OPHTHALMOLOGY EXAM  01/19/2022   Medicare Annual Wellness (AWV)  04/12/2022    Lab Results Last lipids Lab Results  Component Value Date   CHOL 108 05/24/2021   HDL 36.10 (L) 05/24/2021   LDLCALC 54 05/24/2021   TRIG 90.0 05/24/2021   CHOLHDL 3 05/24/2021   Last hemoglobin A1c Lab Results  Component Value Date   HGBA1C 6.4 11/23/2021     Assessment & Plan:   1. Type 2 diabetes mellitus with other specified complication, without long-term current use of insulin ([place]) Jennifer Saunders's A1c has been in excellent control. We will reassess this today. She will continue metformin 1,000 mg bid. We did discuss ACC/AHA guidelines for lipid management that recommend patients with diabetes who are > 40 consider being on a moderate dose of a statin. In light of her very low lipids currently, I cannot tell her that she would have significant benefit from further lowering, as her LDL is already below event he most stringent targets. She notes she does not want to be on additional medication.  - Glucose, random - Hemoglobin A1c  2. Essential hypertension Blood pressure at goal. Continue Hyzaar 100-25 mg daily, amlodipine 5 mg daily, and carvedilol 12.5 mg bid.  Return in about 3 months (around 05/30/2022) for Reassessment.   Haydee Salter, MD

## 2022-03-27 ENCOUNTER — Other Ambulatory Visit: Payer: Self-pay | Admitting: Family Medicine

## 2022-03-27 DIAGNOSIS — Z1231 Encounter for screening mammogram for malignant neoplasm of breast: Secondary | ICD-10-CM

## 2022-05-07 ENCOUNTER — Other Ambulatory Visit: Payer: Self-pay | Admitting: Family Medicine

## 2022-05-07 DIAGNOSIS — I10 Essential (primary) hypertension: Secondary | ICD-10-CM

## 2022-05-07 DIAGNOSIS — E119 Type 2 diabetes mellitus without complications: Secondary | ICD-10-CM

## 2022-05-24 ENCOUNTER — Ambulatory Visit
Admission: RE | Admit: 2022-05-24 | Discharge: 2022-05-24 | Disposition: A | Discharge status: Home / Self Care | Payer: Medicare HMO | Source: Ambulatory Visit | Attending: Family Medicine | Admitting: Family Medicine

## 2022-05-24 DIAGNOSIS — Z1231 Encounter for screening mammogram for malignant neoplasm of breast: Secondary | ICD-10-CM

## 2022-05-30 ENCOUNTER — Ambulatory Visit (INDEPENDENT_AMBULATORY_CARE_PROVIDER_SITE_OTHER): Payer: Medicare HMO | Admitting: Family Medicine

## 2022-05-30 ENCOUNTER — Encounter: Payer: Self-pay | Admitting: Family Medicine

## 2022-05-30 VITALS — BP 118/70 | HR 81 | Temp 97.5°F | Ht 61.0 in | Wt 202.2 lb

## 2022-05-30 DIAGNOSIS — S29011A Strain of muscle and tendon of front wall of thorax, initial encounter: Secondary | ICD-10-CM | POA: Diagnosis not present

## 2022-05-30 DIAGNOSIS — I10 Essential (primary) hypertension: Secondary | ICD-10-CM | POA: Diagnosis not present

## 2022-05-30 DIAGNOSIS — K5900 Constipation, unspecified: Secondary | ICD-10-CM

## 2022-05-30 DIAGNOSIS — E1169 Type 2 diabetes mellitus with other specified complication: Secondary | ICD-10-CM | POA: Diagnosis not present

## 2022-05-30 LAB — HEMOGLOBIN A1C: Hgb A1c MFr Bld: 6 % (ref 4.6–6.5)

## 2022-05-30 LAB — GLUCOSE, RANDOM: Glucose, Bld: 103 mg/dL — ABNORMAL HIGH (ref 70–99)

## 2022-05-30 NOTE — Assessment & Plan Note (Signed)
Diabetes has been in excellent control. We will check her A1c today. Continue metformin 500 mg twice a day.

## 2022-05-30 NOTE — Assessment & Plan Note (Signed)
I am not aware of calcium being significantly constipating. She should add a daily fiber supplement to her routine.

## 2022-05-30 NOTE — Progress Notes (Signed)
[facility] [address] [place] [address] Dept: [phone] Dept Fax: [phone]  Chronic Care Office Visit  Subjective:    Patient ID: Jennifer Saunders, female    DOB: [birth year], 72 y.o..   MRN: [id number]  Chief Complaint  Patient presents with   Follow-up    3 month f/u.  Fasting today.  No concerns.      History of Present Illness:  Patient is in today for reassessment of chronic medical issues.  Jennifer Saunders has a history of Type 2 diabetes managed with metformin 1000 mg bid.    Jennifer Saunders has hypertension managed with Hyzaar 100-25 mg daily, amlodipine 5 mg daily, and carvedilol 12.5 mg bid.   Jennifer Saunders notes that she has some intermittent pain in her left chest, near the axilla. She relates this to having flared up when she had her last mammogram. She feels like it may be a pulled muscle, possibly related to carrying heavy groceries with that arm. She occasionally notes some pain in her left 5th finger. She does nto have any numbness or tingling.  Jennifer Saunders has some occasional constipation. She states her cousin told her this may be due to taking her calcium supplement.  Past Medical History: Patient Active Problem List   Diagnosis Date Noted   COVID-19 08/19/2020   Osteopenia 08/19/2020   Neck pain on right side 06/02/2016   NASH (nonalcoholic steatohepatitis) 04/11/2016   History of adenomatous polyp of colon 10/17/2015   Morbid obesity with BMI of 40.0-44.9, adult ([place]) 10/17/2015   GERD (gastroesophageal reflux disease) 08/15/2015   Undiagnosed cardiac murmurs 12/17/2012   Varicose veins 08/18/2010   Cerebrovascular disease- small vessel 05/17/2008   History of kidney stones 03/18/2008   History of shingles 04/25/2007   Type 2 diabetes mellitus with other specified complication ([place]) 26/71/2458   Anxiety state 12/28/2006   Essential hypertension 12/28/2006   Past Surgical History:   Procedure Laterality Date   COLONOSCOPY     Family History  Problem Relation Age of Onset   Cancer Sister        Colon   Intracerebral hemorrhage Sister    Colon cancer Sister 23   Cancer Mother        Breast   Heart disease Mother        CVA x 2   Breast cancer Mother 64   Diabetes Maternal Aunt    Diabetes Paternal Aunt    Aneurysm Sister    Hypertension Sister    Heart disease Sister    Outpatient Medications Prior to Visit  Medication Sig Dispense Refill   Alcohol Swabs PADS Use daily before blood glucose 100 each 3   amLODipine (NORVASC) 5 MG tablet TAKE 1 TABLET EVERY DAY 90 tablet 3   aspirin 81 MG tablet Take 81 mg by mouth daily.     Blood Glucose Calibration (TRUE METRIX LEVEL 2) Normal SOLN Use daily to check blood glucose controls Dx: E11.9 1 each 2   Blood Glucose Monitoring Suppl (TRUE METRIX METER) w/Device KIT Check blood glucose once daily Dx: E11.9 1 kit 0   Calcium Carbonate-Vit D-Min (CALCIUM 1200) 1200-1000 MG-UNIT CHEW Chew 1 tablet by mouth daily. 30 tablet 0   carvedilol (COREG) 12.5 MG tablet TAKE 1 TABLET TWICE DAILY WITH A MEAL 180 tablet 1   cholecalciferol (VITAMIN D3) 25 MCG (1000 UNIT) tablet Take 1,000 Units by mouth daily.     clotrimazole-betamethasone (LOTRISONE) cream Apply 1  application. topically 2 (two) times daily. For up to 2 weeks. 30 g 0   glucose blood (TRUE METRIX BLOOD GLUCOSE TEST) test strip Check blood glucose daily. Dx: E11.9 100 each 12   losartan-hydrochlorothiazide (HYZAAR) 100-25 MG tablet TAKE 1 TABLET EVERY DAY 90 tablet 10   metFORMIN (GLUCOPHAGE-XR) 500 MG 24 hr tablet TAKE 2 TABLETS TWICE A DAY 360 tablet 1   TRUEplus Lancets 33G MISC Check blood glucose daily Dx: E11.9 100 each 3   No facility-administered medications prior to visit.   No Known Allergies    Objective:   Today's Vitals   05/30/22 0750  BP: 118/70  Pulse: 81  Temp: (!) 97.5 F (36.4 C)  TempSrc: Temporal  SpO2: 99%  Weight: 202 lb 3.2 oz  (91.7 kg)  Height: '5\' 1"'$  (1.549 m)   Body mass index is 38.21 kg/m.   General: Well developed, well nourished. No acute distress. Chest: There is some mild to moderate tenderness with palpation over the pectoral muscle near the left axilla. No   masses are palpable. Extremities: Full ROM. No joint swelling or tenderness. Strength is normal and there are no focal neurological   deficits.  Psych: Alert and oriented. Normal mood and affect.  Health Maintenance Due  Topic Date Due   Zoster Vaccines- Shingrix (1 of 2) Never done   Medicare Annual Wellness (AWV)  04/12/2022     Assessment & Plan:   Problem List Items Addressed This Visit       Cardiovascular and Mediastinum   Essential hypertension    Blood pressure is at goal. Continue amlodipine 5 mg daily, Hyzaar 100-25 mg daily, and carvedilol 12.5 mg twice a day.        Endocrine   Type 2 diabetes mellitus with other specified complication ([place]) - Primary    Diabetes has been in excellent control. We will check her A1c today. Continue metformin 500 mg twice a day.      Relevant Orders   Glucose, random   Hemoglobin A1c     Other   Constipation    I am not aware of calcium being significantly constipating. She should add a daily fiber supplement to her routine.      Other Visit Diagnoses     Muscle strain of anterior chest wall       Appears to be a pectoral muscle strain. I recommend applying heat and using Tylenol as needed.       Return in about 3 months (around 08/28/2022) for Reassessment.   Haydee Salter, MD

## 2022-05-30 NOTE — Assessment & Plan Note (Signed)
Blood pressure is at goal. Continue amlodipine 5 mg daily, Hyzaar 100-25 mg daily, and carvedilol 12.5 mg twice a day.

## 2022-06-11 ENCOUNTER — Telehealth: Payer: Self-pay | Admitting: Family Medicine

## 2022-06-11 NOTE — Telephone Encounter (Signed)
Left message for patient to call back and schedule Medicare Annual Wellness Visit (AWV) either virtually or in office. Left  my Herbie Drape number [phone]   Last AWV 04/12/21 please schedule with Nurse Health Adviser   45 min for awv-i  in office appointments 30 min for awv-s & awv-i phone/virtual appointments

## 2022-06-19 ENCOUNTER — Telehealth: Payer: Self-pay | Admitting: Family Medicine

## 2022-06-19 NOTE — Telephone Encounter (Signed)
Contacted [name] to schedule their annual wellness visit. Appointment made for 06/29/22.  Jennifer Saunders AWV direct phone # [phone]   Patient returned my call to schedule

## 2022-06-19 NOTE — Telephone Encounter (Signed)
Called patient to schedule Medicare Annual Wellness Visit (AWV). No voicemail available to leave a message. No voicemail available to leave a message. Last date of AWV: 04/12/21  Please schedule an appointment at any time with [facility]   If any questions, please contact me at [phone].  Thank you ,  Barkley Boards AWV direct phone # [phone]   Returned patients call  Pt returned my call from 2/12

## 2022-06-29 ENCOUNTER — Ambulatory Visit (INDEPENDENT_AMBULATORY_CARE_PROVIDER_SITE_OTHER): Payer: Medicare HMO

## 2022-06-29 VITALS — Ht 62.0 in | Wt 202.0 lb

## 2022-06-29 DIAGNOSIS — Z0000 Encounter for general adult medical examination without abnormal findings: Secondary | ICD-10-CM | POA: Diagnosis not present

## 2022-06-29 NOTE — Patient Instructions (Signed)
Ms. Jennifer Saunders , Thank you for taking time to come for your Medicare Wellness Visit. I appreciate your ongoing commitment to your health goals. Please review the following plan we discussed and let me know if I can assist you in the future.   These are the goals we discussed:  Goals      Patient Stated     Increase activity & eat more fruits & vegetable     Patient Stated     06/29/2022, wants to get under 200 pounds     [place] (see longitudinal plan of care for additional care plan information)  Current Barriers:  Chronic Disease Management support, education, and care coordination needs related to Hypertension, Hyperlipidemia, and Diabetes   Hypertension BP Readings from Last 3 Encounters:  11/23/19 120/76  06/13/19 136/71  03/17/19 138/84  Pharmacist Clinical Goal(s): Over the next 180 days, patient will work with PharmD and providers to maintain BP goal <130/80 Current regimen:  Losartan-HCTZ 100-25 once daily Amlodipine 5 mg once daily Carvedilol 12.5 mg twice daily with meal Interventions: Diet/exercise recommendations Patient self care activities - Over the next 180 days, patient will: Check BP at least once daily, document, and provide at future appointments Ensure daily salt intake < 2300 mg/day  Diabetes Lab Results  Component Value Date/Time   HGBA1C 6.5 11/23/2019 11:10 AM   HGBA1C 6.5 (A) 11/13/2018 08:18 AM   HGBA1C 6.5 05/06/2018 10:19 AM  Pharmacist Clinical Goal(s): Over the next 180 days, patient will work with PharmD and providers to maintain A1c goal <7% Current regimen:  Metformin XR 500 mg twice daily Interventions: BG Monitor Coverage Review - Rx Request Patient self care activities - Over the next 180 days, patient will: Check blood sugar as directed, document, and provide at future appointments Contact provider with any episodes of hypoglycemia  Medication management Pharmacist Clinical Goal(s): Over the next 180  days, patient will work with PharmD and providers to maintain optimal medication adherence Current pharmacy: [facility] Order Pharmacy Interventions Comprehensive medication review performed. Continue current medication management strategy Patient self care activities - Over the next 180 days, patient will: Take medications as prescribed Report any questions or concerns to PharmD and/or provider(s) Please see past updates related to this goal by clicking on the "Past Updates" button in the selected goal .        This is a list of the screening recommended for you and due dates:  Health Maintenance  Topic Date Due   Zoster (Shingles) Vaccine (1 of 2) Never done   COVID-19 Vaccine (5 - 2023-24 season) 05/03/2022   Yearly kidney function blood test for diabetes  08/25/2022   Yearly kidney health urinalysis for diabetes  08/25/2022   Colon Cancer Screening  11/14/2022   Hemoglobin A1C  11/28/2022   Eye exam for diabetics  01/26/2023   Complete foot exam   02/28/2023   Mammogram  05/25/2023   Medicare Annual Wellness Visit  06/29/2023   DTaP/Tdap/Td vaccine (3 - Td or Tdap) 01/16/2024   Pneumonia Vaccine  Completed   Flu Shot  Completed   DEXA scan (bone density measurement)  Completed   Hepatitis C Screening: USPSTF Recommendation to screen - Ages 9-79 yo.  Completed   HPV Vaccine  Aged Out    Advanced directives: Advance directive discussed with you today.   Conditions/risks identified: none  Next appointment: Follow up in one year for your annual wellness visit  Preventive Care 19 Years and Older, Female Preventive care refers to lifestyle choices and visits with your health care provider that can promote health and wellness. What does preventive care include? A yearly physical exam. This is also called an annual well check. Dental exams once or twice a year. Routine eye exams. Ask your health care provider how often you should have your eyes checked. Personal  lifestyle choices, including: Daily care of your teeth and gums. Regular physical activity. Eating a healthy diet. Avoiding tobacco and drug use. Limiting alcohol use. Practicing safe sex. Taking low-dose aspirin every day. Taking vitamin and mineral supplements as recommended by your health care provider. What happens during an annual well check? The services and screenings done by your health care provider during your annual well check will depend on your age, overall health, lifestyle risk factors, and family history of disease. Counseling  Your health care provider may ask you questions about your: Alcohol use. Tobacco use. Drug use. Emotional well-being. Home and relationship well-being. Sexual activity. Eating habits. History of falls. Memory and ability to understand (cognition). Work and work Statistician. Reproductive health. Screening  You may have the following tests or measurements: Height, weight, and BMI. Blood pressure. Lipid and cholesterol levels. These may be checked every 5 years, or more frequently if you are over 52 years old. Skin check. Lung cancer screening. You may have this screening every year starting at age 53 if you have a 30-pack-year history of smoking and currently smoke or have quit within the past 15 years. Fecal occult blood test (FOBT) of the stool. You may have this test every year starting at age 66. Flexible sigmoidoscopy or colonoscopy. You may have a sigmoidoscopy every 5 years or a colonoscopy every 10 years starting at age 60. Hepatitis C blood test. Hepatitis B blood test. Sexually transmitted disease (STD) testing. Diabetes screening. This is done by checking your blood sugar (glucose) after you have not eaten for a while (fasting). You may have this done every 1-3 years. Bone density scan. This is done to screen for osteoporosis. You may have this done starting at age 66. Mammogram. This may be done every 1-2 years. Talk to your  health care provider about how often you should have regular mammograms. Talk with your health care provider about your test results, treatment options, and if necessary, the need for more tests. Vaccines  Your health care provider may recommend certain vaccines, such as: Influenza vaccine. This is recommended every year. Tetanus, diphtheria, and acellular pertussis (Tdap, Td) vaccine. You may need a Td booster every 10 years. Zoster vaccine. You may need this after age 44. Pneumococcal 13-valent conjugate (PCV13) vaccine. One dose is recommended after age 65. Pneumococcal polysaccharide (PPSV23) vaccine. One dose is recommended after age 64. Talk to your health care provider about which screenings and vaccines you need and how often you need them. This information is not intended to replace advice given to you by your health care provider. Make sure you discuss any questions you have with your health care provider. Document Released: 05/13/2015 Document Revised: 01/04/2016 Document Reviewed: 02/15/2015 Elsevier Interactive Patient Education  2017 [place] Prevention in the Home Falls can cause injuries. They can happen to people of all ages. There are many things you can do to make your home safe and to help prevent falls. What can I do on the outside of my home? Regularly fix the edges of walkways and driveways and fix any cracks. Remove  anything that might make you trip as you walk through a door, such as a raised step or threshold. Trim any bushes or trees on the path to your home. Use bright outdoor lighting. Clear any walking paths of anything that might make someone trip, such as rocks or tools. Regularly check to see if handrails are loose or broken. Make sure that both sides of any steps have handrails. Any raised decks and porches should have guardrails on the edges. Have any leaves, snow, or ice cleared regularly. Use sand or salt on walking paths during winter. Clean  up any spills in your garage right away. This includes oil or grease spills. What can I do in the bathroom? Use night lights. Install grab bars by the toilet and in the tub and shower. Do not use towel bars as grab bars. Use non-skid mats or decals in the tub or shower. If you need to sit down in the shower, use a plastic, non-slip stool. Keep the floor dry. Clean up any water that spills on the floor as soon as it happens. Remove soap buildup in the tub or shower regularly. Attach bath mats securely with double-sided non-slip rug tape. Do not have throw rugs and other things on the floor that can make you trip. What can I do in the bedroom? Use night lights. Make sure that you have a light by your bed that is easy to reach. Do not use any sheets or blankets that are too big for your bed. They should not hang down onto the floor. Have a firm chair that has side arms. You can use this for support while you get dressed. Do not have throw rugs and other things on the floor that can make you trip. What can I do in the kitchen? Clean up any spills right away. Avoid walking on wet floors. Keep items that you use a lot in easy-to-reach places. If you need to reach something above you, use a strong step stool that has a grab bar. Keep electrical cords out of the way. Do not use floor polish or wax that makes floors slippery. If you must use wax, use non-skid floor wax. Do not have throw rugs and other things on the floor that can make you trip. What can I do with my stairs? Do not leave any items on the stairs. Make sure that there are handrails on both sides of the stairs and use them. Fix handrails that are broken or loose. Make sure that handrails are as long as the stairways. Check any carpeting to make sure that it is firmly attached to the stairs. Fix any carpet that is loose or worn. Avoid having throw rugs at the top or bottom of the stairs. If you do have throw rugs, attach them to the  floor with carpet tape. Make sure that you have a light switch at the top of the stairs and the bottom of the stairs. If you do not have them, ask someone to add them for you. What else can I do to help prevent falls? Wear shoes that: Do not have high heels. Have rubber bottoms. Are comfortable and fit you well. Are closed at the toe. Do not wear sandals. If you use a stepladder: Make sure that it is fully opened. Do not climb a closed stepladder. Make sure that both sides of the stepladder are locked into place. Ask someone to hold it for you, if possible. Clearly mark and make sure that you  can see: Any grab bars or handrails. First and last steps. Where the edge of each step is. Use tools that help you move around (mobility aids) if they are needed. These include: Canes. Walkers. Scooters. Crutches. Turn on the lights when you go into a dark area. Replace any light bulbs as soon as they burn out. Set up your furniture so you have a clear path. Avoid moving your furniture around. If any of your floors are uneven, fix them. If there are any pets around you, be aware of where they are. Review your medicines with your doctor. Some medicines can make you feel dizzy. This can increase your chance of falling. Ask your doctor what other things that you can do to help prevent falls. This information is not intended to replace advice given to you by your health care provider. Make sure you discuss any questions you have with your health care provider. Document Released: 02/10/2009 Document Revised: 09/22/2015 Document Reviewed: 05/21/2014 Elsevier Interactive Patient Education  2017 [employer].

## 2022-06-29 NOTE — Progress Notes (Signed)
I connected with  [name] on 06/29/22 by a audio enabled telemedicine application and verified that I am speaking with the correct person using two identifiers.  Patient Location: Home  Provider Location: Office/Clinic  I discussed the limitations of evaluation and management by telemedicine. The patient expressed understanding and agreed to proceed.  Subjective:   Jennifer Saunders is a 72 y.o. female who presents for Medicare Annual (Subsequent) preventive examination.  Review of Systems     Cardiac Risk Factors include: advanced age (>48mn, >>48women);diabetes mellitus;hypertension;obesity (BMI >30kg/m2)     Objective:    Today's Vitals   06/29/22 0841  Weight: 202 lb (91.6 kg)  Height: '5\' 2"'$  (1.575 m)   Body mass index is 36.95 kg/m.     06/29/2022    8:45 AM 04/12/2021    1:40 PM 03/28/2020    3:05 PM 06/13/2019    9:34 AM 03/17/2019   10:35 AM 03/12/2018    1:23 PM 10/22/2016   11:51 AM  Advanced Directives  Does Patient Have a Medical Advance Directive? Yes Yes Yes No Yes Yes No  Type of AParamedicof APine RidgeLiving will [place] will Living will  Living will;Healthcare Power of Attorney Living will   Copy of [place] Chart? No - copy requested No - copy requested   No - copy requested      Current Medications (verified) Outpatient Encounter Medications as of 06/29/2022  Medication Sig   Alcohol Swabs PADS Use daily before blood glucose   amLODipine (NORVASC) 5 MG tablet TAKE 1 TABLET EVERY DAY   aspirin 81 MG tablet Take 81 mg by mouth daily.   Blood Glucose Calibration (TRUE METRIX LEVEL 2) Normal SOLN Use daily to check blood glucose controls Dx: E11.9   Blood Glucose Monitoring Suppl (TRUE METRIX METER) w/Device KIT Check blood glucose once daily Dx: E11.9   Calcium Carbonate-Vit D-Min (CALCIUM 1200) 1200-1000 MG-UNIT CHEW Chew 1 tablet by mouth daily.   carvedilol (COREG) 12.5  MG tablet TAKE 1 TABLET TWICE DAILY WITH A MEAL   cholecalciferol (VITAMIN D3) 25 MCG (1000 UNIT) tablet Take 1,000 Units by mouth daily.   clotrimazole-betamethasone (LOTRISONE) cream Apply 1 application. topically 2 (two) times daily. For up to 2 weeks.   glucose blood (TRUE METRIX BLOOD GLUCOSE TEST) test strip Check blood glucose daily. Dx: E11.9   losartan-hydrochlorothiazide (HYZAAR) 100-25 MG tablet TAKE 1 TABLET EVERY DAY   metFORMIN (GLUCOPHAGE-XR) 500 MG 24 hr tablet TAKE 2 TABLETS TWICE A DAY   TRUEplus Lancets 33G MISC Check blood glucose daily Dx: E11.9   No facility-administered encounter medications on file as of 06/29/2022.    Allergies (verified) Patient has no known allergies.   History: Past Medical History:  Diagnosis Date   Adenomatous colon polyp 2003   Allergy    seasonal   Anxiety    Diabetes mellitus    Dyslipidemia    GERD (gastroesophageal reflux disease)    Hemorrhoids    hx of   Hypertension    Urolithiasis    Past Surgical History:  Procedure Laterality Date   COLONOSCOPY     Family History  Problem Relation Age of Onset   Cancer Sister        Colon   Intracerebral hemorrhage Sister    Colon cancer Sister 567  Cancer Mother        Breast   Heart disease Mother  CVA x 2   Breast cancer Mother 48   Diabetes Maternal Aunt    Diabetes Paternal Aunt    Aneurysm Sister    Hypertension Sister    Heart disease Sister    Social History   Socioeconomic History   Marital status: Divorced    Spouse name: Not on file   Number of children: Not on file   Years of education: Not on file   Highest education level: Not on file  Occupational History   Occupation: Retired  Tobacco Use   Smoking status: Former    Packs/day: 0.25    Types: Cigarettes    Quit date: 1975    Years since quitting: 49.1   Smokeless tobacco: Never   Tobacco comments:    quit age 13  Vaping Use   Vaping Use: Never used  Substance and Sexual Activity    Alcohol use: No    Alcohol/week: 0.0 standard drinks of alcohol   Drug use: No   Sexual activity: Not Currently  Other Topics Concern   Not on file  Social History Narrative   Not on file   Social Determinants of Health   Financial Resource Strain: [place]  (06/29/2022)   Overall Financial Resource Strain (CARDIA)    Difficulty of Paying Living Expenses: Not hard at all  Food Insecurity: No Food Insecurity (06/29/2022)   Hunger Vital Sign    Worried About Running Out of Food in the Last Year: Never true    [place] in the Last Year: Never true  Transportation Needs: No Transportation Needs (06/29/2022)   PRAPARE - Hydrologist (Medical): No    Lack of Transportation (Non-Medical): No  Physical Activity: Insufficiently Active (06/29/2022)   Exercise Vital Sign    Days of Exercise per Week: 2 days    Minutes of Exercise per Session: 20 min  Stress: No Stress Concern Present (06/29/2022)   [place]    Feeling of Stress : Not at all  Social Connections: Moderately Integrated (04/12/2021)   Social Connection and Isolation Panel [NHANES]    Frequency of Communication with Friends and Family: Twice a week    Frequency of Social Gatherings with Friends and Family: Twice a week    Attends Religious Services: More than 4 times per year    Active Member of [employer] or Organizations: Yes    Attends Archivist Meetings: 1 to 4 times per year    Marital Status: Divorced    Tobacco Counseling Counseling given: Not Answered Tobacco comments: quit age 21   Clinical Intake:  Pre-visit preparation completed: Yes  Pain : No/denies pain     Nutritional Status: BMI > 30  Obese Nutritional Risks: None Diabetes: Yes  How often do you need to have someone help you when you read instructions, pamphlets, or other written materials from your doctor or pharmacy?: 1 - Never  Diabetic?  Yes Nutrition Risk Assessment:  Has the patient had any N/V/D within the last 2 months?  No  Does the patient have any non-healing wounds?  No  Has the patient had any unintentional weight loss or weight gain?  No   Diabetes:  Is the patient diabetic?  Yes  If diabetic, was a CBG obtained today?  No  Did the patient bring in their glucometer from home?  No  How often do you monitor your CBG's? Does not.   Financial Strains  and Diabetes Management:  Are you having any financial strains with the device, your supplies or your medication? No .  Does the patient want to be seen by Chronic Care Management for management of their diabetes?  No  Would the patient like to be referred to a Nutritionist or for Diabetic Management?  No   Diabetic Exams:  Diabetic Eye Exam: Completed 01/25/2022 Diabetic Foot Exam: Completed 02/27/2022   Interpreter Needed?: No  Information entered by :: NAllen LPN   Activities of Daily Living    06/29/2022    8:46 AM 06/25/2022    8:46 AM  In your present state of health, do you have any difficulty performing the following activities:  Hearing? 0 0  Vision? 0 0  Difficulty concentrating or making decisions? 0 0  Walking or climbing stairs? 0 0  Dressing or bathing? 0 0  Doing errands, shopping? 0 0  Preparing Food and eating ? N N  Using the Toilet? N N  In the past six months, have you accidently leaked urine? N N  Do you have problems with loss of bowel control? N N  Managing your Medications? N N  Managing your Finances? N N  Housekeeping or managing your Housekeeping? N N    Patient Care Team: Haydee Salter, MD as PCP - General (Family Medicine) Renato Shin, MD (Inactive) as Consulting Physician (Endocrinology) Thelma Comp, OD (Optometry)  Indicate any recent Medical Services you may have received from other than [facility] providers in the past year (date may be approximate).     Assessment:   This is a routine wellness  examination for [place].  Hearing/Vision screen Vision Screening - Comments:: Regular eye exams,   Dietary issues and exercise activities discussed: Current Exercise Habits: Home exercise routine, Type of exercise: Other - see comments (pedaler), Time (Minutes): 20, Frequency (Times/Week): 2, Weekly Exercise (Minutes/Week): 40   Goals Addressed             This Visit's Progress    Patient Stated       06/29/2022, wants to get under 200 pounds       Depression Screen    06/29/2022    8:46 AM 05/30/2022    7:56 AM 04/12/2021    1:45 PM 04/12/2021    1:38 PM 08/19/2020    1:57 PM 03/28/2020    3:08 PM 03/17/2019   10:36 AM  PHQ 2/9 Scores  PHQ - 2 Score 0 0 0 0 0 0 0    Fall Risk    06/29/2022    8:46 AM 06/25/2022    8:46 AM 05/30/2022    7:56 AM 04/12/2021    1:40 PM 03/28/2020    3:07 PM  [place] in the past year? 0 0 0 0 1  Number falls in past yr: 0 0 0 0 0  Injury with Fall? 0 0 0 0 0  Risk for fall due to : Medication side effect  No Fall Risks    Follow up Falls prevention discussed;Education provided;Falls evaluation completed  Falls evaluation completed Falls evaluation completed Falls prevention discussed    FALL RISK PREVENTION PERTAINING TO THE HOME:  Any stairs in or around the home? No  If so, are there any without handrails? N/a Home free of loose throw rugs in walkways, pet beds, electrical cords, etc? Yes  Adequate lighting in your home to reduce risk of falls? Yes   ASSISTIVE DEVICES UTILIZED TO PREVENT FALLS:  Life  alert? No  Use of a cane, walker or w/c? No  Grab bars in the bathroom? Yes  Shower chair or bench in shower? No  Elevated toilet seat or a handicapped toilet? No   TIMED UP AND GO:  Was the test performed? No .      Cognitive Function:    03/17/2019   10:49 AM 03/12/2018    1:24 PM  MMSE - Mini Mental State Exam  Orientation to time 5 5  Orientation to Place 5 5  Registration 3 3  Attention/ Calculation 5 5   Recall 2 0  Language- name 2 objects 2 2  Language- repeat 1 1  Language- follow 3 step command 3 3  Language- read & follow direction 1 1  Write a sentence 1 1  Copy design 1 1  Total score 29 27        06/29/2022    8:47 AM  6CIT Screen  What Year? 0 points  What month? 0 points  What time? 0 points  Count back from 20 0 points  Months in reverse 2 points  Repeat phrase 0 points  Total Score 2 points    Immunizations Immunization History  Administered Date(s) Administered   Influenza, High Dose Seasonal PF 02/25/2014, 03/03/2018, 02/05/2019   Influenza,inj,Quad PF,6+ Mos 03/07/2017   Influenza-Unspecified 01/29/2020, 02/14/2021, 01/10/2022   PFIZER(Purple Top)SARS-COV-2 Vaccination 07/11/2019, 08/04/2019, 04/25/2020   Pfizer Covid-19 Vaccine Bivalent Booster 34yr & up 03/08/2022   Pneumococcal Conjugate-13 01/15/2014   Pneumococcal Polysaccharide-23 05/24/2009, 05/30/2016   Td 08/28/1997   Tdap 01/15/2014    TDAP status: Up to date  Flu Vaccine status: Up to date  Pneumococcal vaccine status: Up to date  Covid-19 vaccine status: Completed vaccines  Qualifies for Shingles Vaccine? Yes   Zostavax completed No   Shingrix Completed?: No.    Education has been provided regarding the importance of this vaccine. Patient has been advised to call insurance company to determine out of pocket expense if they have not yet received this vaccine. Advised may also receive vaccine at local pharmacy or Health Dept. Verbalized acceptance and understanding.  Screening Tests Health Maintenance  Topic Date Due   Zoster Vaccines- Shingrix (1 of 2) Never done   Medicare Annual Wellness (AWV)  04/12/2022   COVID-19 Vaccine (5 - 2023-24 season) 05/03/2022   Diabetic kidney evaluation - eGFR measurement  08/25/2022   Diabetic kidney evaluation - Urine ACR  08/25/2022   COLONOSCOPY (Pts 45-490yrInsurance coverage will need to be confirmed)  11/14/2022   HEMOGLOBIN A1C  11/28/2022    OPHTHALMOLOGY EXAM  01/26/2023   FOOT EXAM  02/28/2023   MAMMOGRAM  05/25/2023   DTaP/Tdap/Td (3 - Td or Tdap) 01/16/2024   Pneumonia Vaccine 6570Years old  Completed   INFLUENZA VACCINE  Completed   DEXA SCAN  Completed   Hepatitis C Screening  Completed   HPV VACCINES  Aged Out    Health Maintenance  Health Maintenance Due  Topic Date Due   Zoster Vaccines- Shingrix (1 of 2) Never done   Medicare Annual Wellness (AWV)  04/12/2022   COVID-19 Vaccine (5 - 2023-24 season) 05/03/2022    Colorectal cancer screening: Type of screening: Colonoscopy. Completed 11/13/2017. Repeat every 5 years  Mammogram status: Completed 05/24/2022. Repeat every year  Bone Density status: Completed 05/20/2020.   Lung Cancer Screening: (Low Dose CT Chest recommended if Age 74-80ears, 30 pack-year currently smoking OR have quit w/in 15years.) does not qualify.  Lung Cancer Screening Referral: no  Additional Screening:  Hepatitis C Screening: does qualify; Completed 04/10/2016  Vision Screening: Recommended annual ophthalmology exams for early detection of glaucoma and other disorders of the eye. Is the patient up to date with their annual eye exam?  Yes  Who is the provider or what is the name of the office in which the patient attends annual eye exams? [place] If pt is not established with a provider, would they like to be referred to a provider to establish care? No .   Dental Screening: Recommended annual dental exams for proper oral hygiene  Community Resource Referral / Chronic Care Management: CRR required this visit?  No   CCM required this visit?  No      Plan:     I have personally reviewed and noted the following in the patient's chart:   Medical and social history Use of alcohol, tobacco or illicit drugs  Current medications and supplements including opioid prescriptions. Patient is not currently taking opioid prescriptions. Functional ability and  status Nutritional status Physical activity Advanced directives List of other physicians Hospitalizations, surgeries, and ER visits in previous 12 months Vitals Screenings to include cognitive, depression, and falls Referrals and appointments  In addition, I have reviewed and discussed with patient certain preventive protocols, quality metrics, and best practice recommendations. A written personalized care plan for preventive services as well as general preventive health recommendations were provided to patient.     Kellie Simmering, LPN   [id number]   Nurse Notes: none  Due to this being a virtual visit, the after visit summary with patients personalized plan was offered to patient via mail or my-chart.  Patient would like to access on my-chart

## 2022-08-27 ENCOUNTER — Encounter: Payer: Self-pay | Admitting: Family Medicine

## 2022-08-27 ENCOUNTER — Ambulatory Visit (INDEPENDENT_AMBULATORY_CARE_PROVIDER_SITE_OTHER): Payer: Medicare HMO | Admitting: Family Medicine

## 2022-08-27 VITALS — BP 118/74 | HR 73 | Temp 97.5°F | Ht 62.0 in | Wt 204.0 lb

## 2022-08-27 DIAGNOSIS — E1169 Type 2 diabetes mellitus with other specified complication: Secondary | ICD-10-CM

## 2022-08-27 DIAGNOSIS — I10 Essential (primary) hypertension: Secondary | ICD-10-CM

## 2022-08-27 DIAGNOSIS — Z6837 Body mass index (BMI) 37.0-37.9, adult: Secondary | ICD-10-CM

## 2022-08-27 DIAGNOSIS — K7581 Nonalcoholic steatohepatitis (NASH): Secondary | ICD-10-CM | POA: Diagnosis not present

## 2022-08-27 LAB — COMPREHENSIVE METABOLIC PANEL
ALT: 16 U/L (ref 0–35)
AST: 17 U/L (ref 0–37)
Albumin: 3.8 g/dL (ref 3.5–5.2)
Alkaline Phosphatase: 68 U/L (ref 39–117)
BUN: 15 mg/dL (ref 6–23)
CO2: 29 mEq/L (ref 19–32)
Calcium: 9.7 mg/dL (ref 8.4–10.5)
Chloride: 103 mEq/L (ref 96–112)
Creatinine, Ser: 1.12 mg/dL (ref 0.40–1.20)
GFR: 48.68 mL/min — ABNORMAL LOW (ref >60.00)
Glucose, Bld: 100 mg/dL — ABNORMAL HIGH (ref 70–99)
Potassium: 3.6 mEq/L (ref 3.5–5.1)
Sodium: 141 mEq/L (ref 135–145)
Total Bilirubin: 0.5 mg/dL (ref 0.2–1.2)
Total Protein: 6.3 g/dL (ref 6.0–8.3)

## 2022-08-27 LAB — URINALYSIS, ROUTINE W REFLEX MICROSCOPIC
Bilirubin Urine: NEGATIVE
Hgb urine dipstick: NEGATIVE
Ketones, ur: NEGATIVE
Leukocytes,Ua: NEGATIVE
Nitrite: NEGATIVE
Specific Gravity, Urine: >=1.03 — AB (ref 1.000–1.030)
Total Protein, Urine: NEGATIVE
Urine Glucose: NEGATIVE
Urobilinogen, UA: 0.2 (ref 0.0–1.0)
pH: 5.5 (ref 5.0–8.0)

## 2022-08-27 LAB — MICROALBUMIN / CREATININE URINE RATIO
Creatinine,U: 179.3 mg/dL
Microalb Creat Ratio: 0.4 mg/g (ref 0.0–30.0)
Microalb, Ur: <0.7 mg/dL (ref 0.0–1.9)

## 2022-08-27 LAB — LIPID PANEL
Cholesterol: 108 mg/dL (ref 0–200)
HDL: 40.5 mg/dL (ref >39.00)
LDL Cholesterol: 53 mg/dL (ref 0–99)
NonHDL: 67.03
Total CHOL/HDL Ratio: 3
Triglycerides: 71 mg/dL (ref 0.0–149.0)
VLDL: 14.2 mg/dL (ref 0.0–40.0)

## 2022-08-27 LAB — HEMOGLOBIN A1C: Hgb A1c MFr Bld: 6.2 % (ref 4.6–6.5)

## 2022-08-27 NOTE — Assessment & Plan Note (Signed)
Maximum weight: 222 lbs (01/2021) Current weight: 200 lbs Weight change since last visit: - 2 lbs Total weight loss: 22 lbs (10%)  Encouraged Jennifer Saunders to continue her weight loss efforts, as she is making good gains. Her BP and diabetes is in very good control.

## 2022-08-27 NOTE — Progress Notes (Signed)
[facility] [address] [place] [address] Dept: [phone] Dept Fax: [phone]  Chronic Care Office Visit  Subjective:    Patient ID: Jennifer Saunders, female    DOB: 04/04/1951, 72 y.o..   MRN: [id number]  Chief Complaint  Patient presents with   Medical Management of Chronic Issues    3 month f/u.  No concerns.   Fasting today.    History of Present Illness:  Patient is in today for reassessment of chronic medical issues.  Ms. Salem has a history of Type 2 diabetes, managed with metformin 1000 mg bid.    Ms. Buechler has hypertension managed with Hyzaar 100-25 mg daily, amlodipine 5 mg daily, and carvedilol 12.5 mg bid.  Ms. Woolworth has a history of morbid obesity. She has been working over time to reduce her weight, primarily through dietary efforts.   Past Medical History: Patient Active Problem List   Diagnosis Date Noted   Constipation 05/30/2022   COVID-19 08/19/2020   Osteopenia 08/19/2020   Neck pain on right side 06/02/2016   NASH (nonalcoholic steatohepatitis) 04/11/2016   History of adenomatous polyp of colon 10/17/2015   Class 2 severe obesity with serious comorbidity and body mass index (BMI) of 37.0 to 37.9 in adult (HCC) 10/17/2015   GERD (gastroesophageal reflux disease) 08/15/2015   Undiagnosed cardiac murmurs 12/17/2012   Varicose veins 08/18/2010   Cerebrovascular disease- small vessel 05/17/2008   History of kidney stones 03/18/2008   History of shingles 04/25/2007   Type 2 diabetes mellitus with other specified complication (HCC) 12/28/2006   Anxiety state 12/28/2006   Essential hypertension 12/28/2006   Past Surgical History:  Procedure Laterality Date   COLONOSCOPY     Family History  Problem Relation Age of Onset   Cancer Sister        Colon   Intracerebral hemorrhage Sister    Colon cancer Sister 45   Cancer Mother        Breast   Heart disease Mother        CVA x  2   Breast cancer Mother 71   Diabetes Maternal Aunt    Diabetes Paternal Aunt    Aneurysm Sister    Hypertension Sister    Heart disease Sister    Outpatient Medications Prior to Visit  Medication Sig Dispense Refill   Alcohol Swabs PADS Use daily before blood glucose 100 each 3   amLODipine (NORVASC) 5 MG tablet TAKE 1 TABLET EVERY DAY 90 tablet 3   aspirin 81 MG tablet Take 81 mg by mouth daily.     Blood Glucose Calibration (TRUE METRIX LEVEL 2) Normal SOLN Use daily to check blood glucose controls Dx: E11.9 1 each 2   Blood Glucose Monitoring Suppl (TRUE METRIX METER) w/Device KIT Check blood glucose once daily Dx: E11.9 1 kit 0   Calcium Carbonate-Vit D-Min (CALCIUM 1200) 1200-1000 MG-UNIT CHEW Chew 1 tablet by mouth daily. 30 tablet 0   carvedilol (COREG) 12.5 MG tablet TAKE 1 TABLET TWICE DAILY WITH A MEAL 180 tablet 1   cholecalciferol (VITAMIN D3) 25 MCG (1000 UNIT) tablet Take 1,000 Units by mouth daily.     clotrimazole-betamethasone (LOTRISONE) cream Apply 1 application. topically 2 (two) times daily. For up to 2 weeks. 30 g 0   glucose blood (TRUE METRIX BLOOD GLUCOSE TEST) test strip Check blood glucose daily. Dx: E11.9 100 each 12   losartan-hydrochlorothiazide (HYZAAR) 100-25 MG tablet TAKE 1 TABLET EVERY DAY 90  tablet 10   metFORMIN (GLUCOPHAGE-XR) 500 MG 24 hr tablet TAKE 2 TABLETS TWICE A DAY 360 tablet 1   TRUEplus Lancets 33G MISC Check blood glucose daily Dx: E11.9 100 each 3   No facility-administered medications prior to visit.   No Known Allergies Objective:   Today's Vitals   08/27/22 0849  BP: 118/74  Pulse: 73  Temp: (!) 97.5 F (36.4 C)  TempSrc: Temporal  SpO2: 99%  Weight: 204 lb (92.5 kg)  Height: 5\' 2"  (1.575 m)   Body mass index is 37.31 kg/m.   General: Well developed, well nourished. No acute distress. Psych: Alert and oriented. Normal mood and affect.  Health Maintenance Due  Topic Date Due   Diabetic kidney evaluation - eGFR  measurement  08/25/2022   Diabetic kidney evaluation - Urine ACR  08/25/2022     Assessment & Plan:   Problem List Items Addressed This Visit       Cardiovascular and Mediastinum   Essential hypertension    Blood pressure is at goal. Continue amlodipine 5 mg daily, Hyzaar 100-25 mg daily, and carvedilol 12.5 mg twice a day.      Relevant Orders   Comprehensive metabolic panel     Digestive   NASH (nonalcoholic steatohepatitis)    I will reassess liver enzymes today. Ms. Syler has been losing weight, so this may be improved.      Relevant Orders   Comprehensive metabolic panel     Endocrine   Type 2 diabetes mellitus with other specified complication (HCC) - Primary    Diabetes has been in excellent control. We will check her annual DM labs today. Continue metformin 500 mg twice a day.      Relevant Orders   Lipid panel   Microalbumin / creatinine urine ratio   Hemoglobin A1c   Urinalysis, Routine w reflex microscopic   Comprehensive metabolic panel     Other   Class 2 severe obesity with serious comorbidity and body mass index (BMI) of 37.0 to 37.9 in adult (HCC)    Maximum weight: 222 lbs (01/2021) Current weight: 200 lbs Weight change since last visit: - 2 lbs Total weight loss: 22 lbs (10%)  Encouraged Ms. Mcmanamon to continue her weight loss efforts, as she is making good gains. Her BP and diabetes is in very good control.       Return in about 4 months (around 12/27/2022) for Reassessment.   Loyola Mast, MD

## 2022-08-27 NOTE — Assessment & Plan Note (Signed)
I will reassess liver enzymes today. Jennifer Saunders has been losing weight, so this may be improved.

## 2022-08-27 NOTE — Assessment & Plan Note (Signed)
Diabetes has been in excellent control. We will check her annual DM labs today. Continue metformin 500 mg twice a day.

## 2022-08-27 NOTE — Assessment & Plan Note (Signed)
Blood pressure is at goal. Continue amlodipine 5 mg daily, Hyzaar 100-25 mg daily, and carvedilol 12.5 mg twice a day. 

## 2022-09-26 ENCOUNTER — Encounter: Payer: Self-pay | Admitting: Gastroenterology

## 2022-10-15 ENCOUNTER — Encounter: Payer: Self-pay | Admitting: Gastroenterology

## 2022-10-27 ENCOUNTER — Other Ambulatory Visit: Payer: Self-pay | Admitting: Family Medicine

## 2022-10-27 DIAGNOSIS — I10 Essential (primary) hypertension: Secondary | ICD-10-CM

## 2022-11-14 ENCOUNTER — Encounter: Payer: Self-pay | Admitting: Gastroenterology

## 2022-11-14 ENCOUNTER — Ambulatory Visit (AMBULATORY_SURGERY_CENTER): Payer: Medicare HMO

## 2022-11-14 VITALS — Ht 62.0 in | Wt 200.0 lb

## 2022-11-14 DIAGNOSIS — Z86010 Personal history of colonic polyps: Secondary | ICD-10-CM

## 2022-11-14 DIAGNOSIS — Z800 Family history of malignant neoplasm of digestive organs: Secondary | ICD-10-CM

## 2022-11-14 MED ORDER — NA SULFATE-K SULFATE-MG SULF 17.5-3.13-1.6 GM/177ML PO SOLN: 1.0000 | Freq: Once | ORAL | 0 refills | Status: AC

## 2022-11-14 NOTE — Progress Notes (Signed)
Pre visit completed via phone call; Patient verified name, DOB, and address;  No egg or soy allergy known to patient;  No issues known to pt with past sedation with any surgeries or procedures; Patient denies ever being told they had issues or difficulty with intubation;  No FH of Malignant Hyperthermia; Pt is not on diet pills; Pt is not on home 02;  Pt is not on blood thinners;  Pt reports issues with constipation -patient states she does breathing exercises to relieve constipation-does not really take any OTC meds for relief; patient advised to increase fluids, activity, and fruits/veggies that are allowed;  No A fib or A flutter; Have any cardiac testing pending--NO Pt instructed to use Singlecare.com or GoodRx for a price reduction on prep;   Insurance verified during PV appt=[employer]  Patient's chart reviewed by Jennifer Saunders CNRA prior to previsit and patient appropriate for the [facility].  Previsit completed and red dot placed by patient's name on their procedure day (on provider's schedule).    Instructions sent to patient via MyChart as wellas printed and mailed to the patient along with GoodRx coupon for [facility];

## 2022-11-27 ENCOUNTER — Encounter: Payer: Self-pay | Admitting: Gastroenterology

## 2022-11-27 ENCOUNTER — Ambulatory Visit (AMBULATORY_SURGERY_CENTER): Payer: Medicare HMO | Admitting: Gastroenterology

## 2022-11-27 VITALS — BP 132/71 | HR 54 | Temp 96.2°F | Resp 14 | Ht 62.0 in | Wt 200.0 lb

## 2022-11-27 DIAGNOSIS — Z800 Family history of malignant neoplasm of digestive organs: Secondary | ICD-10-CM | POA: Diagnosis not present

## 2022-11-27 DIAGNOSIS — Z86010 Personal history of colonic polyps: Secondary | ICD-10-CM | POA: Diagnosis not present

## 2022-11-27 DIAGNOSIS — Z09 Encounter for follow-up examination after completed treatment for conditions other than malignant neoplasm: Secondary | ICD-10-CM | POA: Diagnosis not present

## 2022-11-27 DIAGNOSIS — I10 Essential (primary) hypertension: Secondary | ICD-10-CM | POA: Diagnosis not present

## 2022-11-27 DIAGNOSIS — D125 Benign neoplasm of sigmoid colon: Secondary | ICD-10-CM | POA: Diagnosis not present

## 2022-11-27 DIAGNOSIS — E119 Type 2 diabetes mellitus without complications: Secondary | ICD-10-CM | POA: Diagnosis not present

## 2022-11-27 MED ORDER — SODIUM CHLORIDE 0.9 % IV SOLN: 500.0000 mL | INTRAVENOUS | Status: DC

## 2022-11-27 NOTE — Progress Notes (Signed)
Called to room to assist during endoscopic procedure.  Patient ID and intended procedure confirmed with present staff. Received instructions for my participation in the procedure from the performing physician.  

## 2022-11-27 NOTE — Progress Notes (Signed)
Report to PACU, RN, vss, BBS= Clear.  

## 2022-11-27 NOTE — Patient Instructions (Signed)
Discharge instructions given. Handout on polyps. Resume previous medications. YOU HAD AN ENDOSCOPIC PROCEDURE TODAY AT THE [facility]:   Refer to the procedure report that was given to you for any specific questions about what was found during the examination.  If the procedure report does not answer your questions, please call your gastroenterologist to clarify.  If you requested that your care partner not be given the details of your procedure findings, then the procedure report has been included in a sealed envelope for you to review at your convenience later.  YOU SHOULD EXPECT: Some feelings of bloating in the abdomen. Passage of more gas than usual.  Walking can help get rid of the air that was put into your GI tract during the procedure and reduce the bloating. If you had a lower endoscopy (such as a colonoscopy or flexible sigmoidoscopy) you may notice spotting of blood in your stool or on the toilet paper. If you underwent a bowel prep for your procedure, you may not have a normal bowel movement for a few days.  Please Note:  You might notice some irritation and congestion in your nose or some drainage.  This is from the oxygen used during your procedure.  There is no need for concern and it should clear up in a day or so.  SYMPTOMS TO REPORT IMMEDIATELY:  Following lower endoscopy (colonoscopy or flexible sigmoidoscopy):  Excessive amounts of blood in the stool  Significant tenderness or worsening of abdominal pains  Swelling of the abdomen that is new, acute  Fever of 100F or higher   For urgent or emergent issues, a gastroenterologist can be reached at any hour by calling [phone]. Do not use MyChart messaging for urgent concerns.    DIET:  We do recommend a small meal at first, but then you may proceed to your regular diet.  Drink plenty of fluids but you should avoid alcoholic beverages for 24 hours.  ACTIVITY:  You should plan to take it easy for the rest  of today and you should NOT DRIVE or use heavy machinery until tomorrow (because of the sedation medicines used during the test).    FOLLOW UP: Our staff will call the number listed on your records the next business day following your procedure.  We will call around 7:15- 8:00 am to check on you and address any questions or concerns that you may have regarding the information given to you following your procedure. If we do not reach you, we will leave a message.     If any biopsies were taken you will be contacted by phone or by letter within the next 1-3 weeks.  Please call us at [phone] if you have not heard about the biopsies in 3 weeks.    SIGNATURES/CONFIDENTIALITY: You and/or your care partner have signed paperwork which will be entered into your electronic medical record.  These signatures attest to the fact that that the information above on your After Visit Summary has been reviewed and is understood.  Full responsibility of the confidentiality of this discharge information lies with you and/or your care-partner. 

## 2022-11-27 NOTE — Progress Notes (Signed)
History & Physical  Primary Care Physician:  Loyola Mast, MD Primary Gastroenterologist: Claudette Head, MD  Impression / Plan:  Personal history of adenomatous colon polyps, family history of colon cancer for surveillance colonoscopy.  CHIEF COMPLAINT:  Personal history of colon polyps   HPI: Jennifer Saunders is a 72 y.o. female with a personal history of adenomatous colon polyps and a family history of colon cancer, first-degree relative, for surveillance colonoscopy.    Past Medical History:  Diagnosis Date   Adenomatous colon polyp 2003   Allergy    seasonal   Anxiety    Diabetes mellitus    on meds   Dyslipidemia    GERD (gastroesophageal reflux disease)    on meds-OTC PRN meds-with certain foods;   Hemorrhoids    hx of   Hypertension    on meds   Urolithiasis     Past Surgical History:  Procedure Laterality Date   COLONOSCOPY  2019   MS-MAC-suprep(good)-TA   POLYPECTOMY  2019   TA    Prior to Admission medications   Medication Sig Start Date End Date Taking? Authorizing Provider  amLODipine (NORVASC) 5 MG tablet TAKE 1 TABLET EVERY DAY 10/29/22  Yes Loyola Mast, MD  aspirin 81 MG tablet Take 81 mg by mouth daily.   Yes [provider]  carvedilol (COREG) 12.5 MG tablet TAKE 1 TABLET TWICE DAILY WITH A MEAL Patient taking differently: Take 12.5 mg by mouth 2 (two) times daily with a meal. 05/07/22  Yes Rudd, Bertram Millard, MD  cholecalciferol (VITAMIN D3) 25 MCG (1000 UNIT) tablet Take 1,000 Units by mouth daily.   Yes [provider]  losartan-hydrochlorothiazide (HYZAAR) 100-25 MG tablet TAKE 1 TABLET EVERY DAY 02/19/22  Yes Garnette Gunner, MD  metFORMIN (GLUCOPHAGE-XR) 500 MG 24 hr tablet TAKE 2 TABLETS TWICE A DAY 05/07/22  Yes Loyola Mast, MD  Alcohol Swabs PADS Use daily before blood glucose 03/15/20   Waldon Merl, PA-C  Blood Glucose Calibration (TRUE METRIX LEVEL 2) Normal SOLN Use daily to check blood glucose controls Dx:  E11.9 03/15/20   Waldon Merl, PA-C  Blood Glucose Monitoring Suppl (TRUE METRIX METER) w/Device KIT Check blood glucose once daily Dx: E11.9 03/15/20   Waldon Merl, PA-C  Calcium Carbonate-Vit D-Min (CALCIUM 1200 PO) Take 1 tablet by mouth daily at 6 (six) AM. Patient not taking: Reported on 11/27/2022    [provider]  clotrimazole-betamethasone (LOTRISONE) cream Apply 1 application. topically 2 (two) times daily. For up to 2 weeks. 08/24/21   Loyola Mast, MD  glucose blood (TRUE METRIX BLOOD GLUCOSE TEST) test strip Check blood glucose daily. Dx: E11.9 03/15/20   Waldon Merl, PA-C  TRUEplus Lancets 33G MISC Check blood glucose daily Dx: E11.9 03/15/20   Waldon Merl, PA-C    Current Outpatient Medications  Medication Sig Dispense Refill   amLODipine (NORVASC) 5 MG tablet TAKE 1 TABLET EVERY DAY 90 tablet 3   aspirin 81 MG tablet Take 81 mg by mouth daily.     carvedilol (COREG) 12.5 MG tablet TAKE 1 TABLET TWICE DAILY WITH A MEAL (Patient taking differently: Take 12.5 mg by mouth 2 (two) times daily with a meal.) 180 tablet 1   cholecalciferol (VITAMIN D3) 25 MCG (1000 UNIT) tablet Take 1,000 Units by mouth daily.     losartan-hydrochlorothiazide (HYZAAR) 100-25 MG tablet TAKE 1 TABLET EVERY DAY 90 tablet 10   metFORMIN (GLUCOPHAGE-XR) 500 MG 24  hr tablet TAKE 2 TABLETS TWICE A DAY 360 tablet 1   Alcohol Swabs PADS Use daily before blood glucose 100 each 3   Blood Glucose Calibration (TRUE METRIX LEVEL 2) Normal SOLN Use daily to check blood glucose controls Dx: E11.9 1 each 2   Blood Glucose Monitoring Suppl (TRUE METRIX METER) w/Device KIT Check blood glucose once daily Dx: E11.9 1 kit 0   Calcium Carbonate-Vit D-Min (CALCIUM 1200 PO) Take 1 tablet by mouth daily at 6 (six) AM. (Patient not taking: Reported on 11/27/2022)     clotrimazole-betamethasone (LOTRISONE) cream Apply 1 application. topically 2 (two) times daily. For up to 2 weeks. 30 g 0   glucose  blood (TRUE METRIX BLOOD GLUCOSE TEST) test strip Check blood glucose daily. Dx: E11.9 100 each 12   TRUEplus Lancets 33G MISC Check blood glucose daily Dx: E11.9 100 each 3   Current Facility-Administered Medications  Medication Dose Route Frequency Provider Last Rate Last Admin   0.9 %  sodium chloride infusion  500 mL Intravenous Continuous Meryl Dare, MD        Allergies as of 11/27/2022   (No Known Allergies)    Family History  Problem Relation Age of Onset   Heart disease Mother        CVA x 2   Breast cancer Mother 37   Colon polyps Sister 55   Intracerebral hemorrhage Sister    Colon cancer Sister 19   Aneurysm Sister    Hypertension Sister    Heart disease Sister    Colon cancer Brother 71   Colon polyps Brother 47   Diabetes Maternal Aunt    Diabetes Paternal Aunt    Esophageal cancer Neg Hx    Rectal cancer Neg Hx    Stomach cancer Neg Hx     Social History   Socioeconomic History   Marital status: Divorced    Spouse name: Not on file   Number of children: Not on file   Years of education: Not on file   Highest education level: 12th grade  Occupational History   Occupation: Retired  Tobacco Use   Smoking status: Former    Current packs/day: 0.00    Types: Cigarettes    Quit date: 1975    Years since quitting: 49.6   Smokeless tobacco: Never   Tobacco comments:    quit age 55  Vaping Use   Vaping status: Never Used  Substance and Sexual Activity   Alcohol use: No    Alcohol/week: 0.0 standard drinks of alcohol   Drug use: No   Sexual activity: Not Currently  Other Topics Concern   Not on file  Social History Narrative   Not on file   Social Determinants of Health   Financial Resource Strain: Low Risk  (08/23/2022)   Overall Financial Resource Strain (CARDIA)    Difficulty of Paying Living Expenses: Not hard at all  Food Insecurity: No Food Insecurity (08/23/2022)   Hunger Vital Sign    Worried About Running Out of Food in the Last  Year: Never true    Ran Out of Food in the Last Year: Never true  Transportation Needs: No Transportation Needs (08/23/2022)   PRAPARE - Administrator, Civil Service (Medical): No    Lack of Transportation (Non-Medical): No  Physical Activity: Insufficiently Active (08/23/2022)   Exercise Vital Sign    Days of Exercise per Week: 3 days    Minutes of Exercise per Session: 20 min  Stress: No Stress Concern Present (08/23/2022)   [employer] of Occupational Health - Occupational Stress Questionnaire    Feeling of Stress : Not at all  Social Connections: Moderately Integrated (08/23/2022)   Social Connection and Isolation Panel [NHANES]    Frequency of Communication with Friends and Family: More than three times a week    Frequency of Social Gatherings with Friends and Family: More than three times a week    Attends Religious Services: More than 4 times per year    Active Member of [employer] or Organizations: Yes    Attends Engineer, structural: More than 4 times per year    Marital Status: Divorced  Intimate Partner Violence: Not At Risk (04/12/2021)   Humiliation, Afraid, Rape, and Kick questionnaire    Fear of Current or Ex-Partner: No    Emotionally Abused: No    Physically Abused: No    Sexually Abused: No    Review of Systems:  All systems reviewed were negative except where noted in HPI.   Physical Exam:  General:  Alert, well-developed, in NAD Head:  Normocephalic and atraumatic. Eyes:  Sclera clear, no icterus.   Conjunctiva pink. Ears:  Normal auditory acuity. Mouth:  No deformity or lesions.  Neck:  Supple; no masses. Lungs:  Clear throughout to auscultation.   No wheezes, crackles, or rhonchi.  Heart:  Regular rate and rhythm; no murmurs. Abdomen:  Soft, nondistended, nontender. No masses, hepatomegaly. No palpable masses.  Normal bowel sounds.    Rectal:  Deferred   Msk:  Symmetrical without gross deformities. Extremities:  Without  edema. Neurologic:  Alert and  oriented x 4; grossly normal neurologically. Skin:  Intact without significant lesions or rashes. Psych:  Alert and cooperative. Normal mood and affect.   Venita Lick. Russella Dar  11/27/2022, 1:28 PM See Loretha Stapler, [facility], to contact our on call provider

## 2022-11-27 NOTE — Progress Notes (Signed)
Patient states there have been no changes to medical or surgical history since time of pre-visit. 

## 2022-11-27 NOTE — Op Note (Signed)
[facility] Patient Name: Jennifer Saunders Procedure Date: 11/27/2022 12:38 PM MRN: [id number] Endoscopist: Meryl Dare , MD, [phone] Age: 72 Referring MD:  Date of Birth: [birth year] Gender: Female Account #: [id number] Procedure:                Colonoscopy Indications:              Surveillance: Personal history of adenomatous                            polyps on last colonoscopy 5 years ago Medicines:                Monitored Anesthesia Care Procedure:                Pre-Anesthesia Assessment:                           - Prior to the procedure, a History and Physical                            was performed, and patient medications and                            allergies were reviewed. The patient's tolerance of                            previous anesthesia was also reviewed. The risks                            and benefits of the procedure and the sedation                            options and risks were discussed with the patient.                            All questions were answered, and informed consent                            was obtained. Prior Anticoagulants: The patient has                            taken no anticoagulant or antiplatelet agents. ASA                            Grade Assessment: II - A patient with mild systemic                            disease. After reviewing the risks and benefits,                            the patient was deemed in satisfactory condition to                            undergo the procedure.  After obtaining informed consent, the colonoscope                            was passed under direct vision. Throughout the                            procedure, the patient's blood pressure, pulse, and                            oxygen saturations were monitored continuously. The                            Olympus Scope [id number] was introduced through the                            anus and advanced  to the the cecum, identified by                            appendiceal orifice and ileocecal valve. The                            ileocecal valve, appendiceal orifice, and rectum                            were photographed. The quality of the bowel                            preparation was good. The colonoscopy was performed                            without difficulty. The patient tolerated the                            procedure well. Scope In: 1:35:15 PM Scope Out: 1:46:12 PM Scope Withdrawal Time: 0 hours 9 minutes 30 seconds  Total Procedure Duration: 0 hours 10 minutes 57 seconds  Findings:                 The perianal and digital rectal examinations were                            normal.                           A 5 mm polyp was found in the sigmoid colon. The                            polyp was sessile. The polyp was removed with a                            cold snare. Resection and retrieval were complete.                           Internal hemorrhoids were found during  retroflexion. The hemorrhoids were small and Grade                            I (internal hemorrhoids that do not prolapse).                           The exam was otherwise without abnormality on                            direct and retroflexion views. Complications:            No immediate complications. Estimated blood loss:                            None. Estimated Blood Loss:     Estimated blood loss: none. Impression:               - One 5 mm polyp in the sigmoid colon, removed with                            a cold snare. Resected and retrieved.                           - Internal hemorrhoids.                           - The examination was otherwise normal on direct                            and retroflexion views. Recommendation:           - Patient has a contact number available for                            emergencies. The signs and symptoms of potential                             delayed complications were discussed with the                            patient. Return to normal activities tomorrow.                            Written discharge instructions were provided to the                            patient.                           - Resume previous diet.                           - Continue present medications.                           - Await pathology results.                           -  No repeat colonoscopy due to age. Meryl Dare, MD 11/27/2022 2:09:14 PM This report has been signed electronically.

## 2022-11-28 ENCOUNTER — Encounter (INDEPENDENT_AMBULATORY_CARE_PROVIDER_SITE_OTHER): Payer: Self-pay

## 2022-11-28 ENCOUNTER — Telehealth: Payer: Self-pay

## 2022-11-28 NOTE — Telephone Encounter (Signed)
  Follow up Call-     11/27/2022   12:42 PM  Call back number  Post procedure Call Back phone  # [phone]  Permission to leave phone message Yes     Patient questions:  Do you have a fever, pain , or abdominal swelling? No. Pain Score  0 *  Have you tolerated food without any problems? Yes.    Have you been able to return to your normal activities? Yes.    Do you have any questions about your discharge instructions: Diet   No. Medications  No. Follow up visit  No.  Do you have questions or concerns about your Care? No.  Actions: * If pain score is 4 or above: No action needed, pain <4.

## 2022-12-07 ENCOUNTER — Encounter: Payer: Self-pay | Admitting: Gastroenterology

## 2022-12-25 ENCOUNTER — Other Ambulatory Visit: Payer: Self-pay | Admitting: Family Medicine

## 2022-12-25 DIAGNOSIS — E119 Type 2 diabetes mellitus without complications: Secondary | ICD-10-CM

## 2022-12-27 ENCOUNTER — Encounter: Payer: Self-pay | Admitting: Family Medicine

## 2022-12-27 ENCOUNTER — Ambulatory Visit (INDEPENDENT_AMBULATORY_CARE_PROVIDER_SITE_OTHER): Payer: Medicare HMO | Admitting: Family Medicine

## 2022-12-27 VITALS — BP 114/78 | HR 76 | Temp 97.7°F | Ht 62.0 in | Wt 204.8 lb

## 2022-12-27 DIAGNOSIS — I10 Essential (primary) hypertension: Secondary | ICD-10-CM | POA: Diagnosis not present

## 2022-12-27 DIAGNOSIS — Z7984 Long term (current) use of oral hypoglycemic drugs: Secondary | ICD-10-CM

## 2022-12-27 DIAGNOSIS — Z23 Encounter for immunization: Secondary | ICD-10-CM | POA: Diagnosis not present

## 2022-12-27 DIAGNOSIS — E1169 Type 2 diabetes mellitus with other specified complication: Secondary | ICD-10-CM | POA: Diagnosis not present

## 2022-12-27 LAB — GLUCOSE, RANDOM: Glucose, Bld: 100 mg/dL — ABNORMAL HIGH (ref 70–99)

## 2022-12-27 LAB — HEMOGLOBIN A1C: Hgb A1c MFr Bld: 6.3 % (ref 4.6–6.5)

## 2022-12-27 NOTE — Assessment & Plan Note (Signed)
Blood pressure is at goal. Continue amlodipine 5 mg daily, Hyzaar 100-25 mg daily, and carvedilol 12.5 mg twice a day.

## 2022-12-27 NOTE — Progress Notes (Signed)
[facility] [address] [place] [address] Dept: [phone] Dept Fax: [phone]  Chronic Care Office Visit  Subjective:    Patient ID: JAMAAL ABELN, female    DOB: [birth year], 72 y.o..   MRN: [id number]  Chief Complaint  Patient presents with   Hypertension    4 month f/u HTN/DM. No concerns.  Fasting today    History of Present Illness:  Patient is in today for reassessment of chronic medical issues.  Ms. Dannenberg has a history of Type 2 diabetes, managed with metformin 1000 mg bid.    Ms. Leamer has hypertension managed with Hyzaar 100-25 mg daily, amlodipine 5 mg daily, and carvedilol 12.5 mg bid.  Past Medical History: Patient Active Problem List   Diagnosis Date Noted   Constipation 05/30/2022   COVID-19 08/19/2020   Osteopenia 08/19/2020   Neck pain on right side 06/02/2016   NASH (nonalcoholic steatohepatitis) 04/11/2016   History of adenomatous polyp of colon 10/17/2015   Class 2 severe obesity with serious comorbidity and body mass index (BMI) of 37.0 to 37.9 in adult (HCC) 10/17/2015   GERD (gastroesophageal reflux disease) 08/15/2015   Undiagnosed cardiac murmurs 12/17/2012   Varicose veins 08/18/2010   Cerebrovascular disease- small vessel 05/17/2008   History of kidney stones 03/18/2008   History of shingles 04/25/2007   Type 2 diabetes mellitus with other specified complication (HCC) 12/28/2006   Anxiety state 12/28/2006   Essential hypertension 12/28/2006   Past Surgical History:  Procedure Laterality Date   COLONOSCOPY  2019   MS-MAC-suprep(good)-TA   POLYPECTOMY  2019   TA   Family History  Problem Relation Age of Onset   Heart disease Mother        CVA x 2   Breast cancer Mother 17   Colon polyps Sister 27   Intracerebral hemorrhage Sister    Colon cancer Sister 74   Aneurysm Sister    Hypertension Sister    Heart disease Sister    Colon cancer Brother 24   Colon  polyps Brother 40   Diabetes Maternal Aunt    Diabetes Paternal Aunt    Esophageal cancer Neg Hx    Rectal cancer Neg Hx    Stomach cancer Neg Hx    Outpatient Medications Prior to Visit  Medication Sig Dispense Refill   Alcohol Swabs PADS Use daily before blood glucose 100 each 3   amLODipine (NORVASC) 5 MG tablet TAKE 1 TABLET EVERY DAY 90 tablet 3   aspirin 81 MG tablet Take 81 mg by mouth daily.     Blood Glucose Calibration (TRUE METRIX LEVEL 2) Normal SOLN Use daily to check blood glucose controls Dx: E11.9 1 each 2   Blood Glucose Monitoring Suppl (TRUE METRIX METER) w/Device KIT Check blood glucose once daily Dx: E11.9 1 kit 0   Calcium Carbonate-Vit D-Min (CALCIUM 1200 PO) Take 1 tablet by mouth daily at 6 (six) AM.     carvedilol (COREG) 12.5 MG tablet TAKE 1 TABLET TWICE DAILY WITH A MEAL (Patient taking differently: Take 12.5 mg by mouth 2 (two) times daily with a meal.) 180 tablet 1   cholecalciferol (VITAMIN D3) 25 MCG (1000 UNIT) tablet Take 1,000 Units by mouth daily.     clotrimazole-betamethasone (LOTRISONE) cream Apply 1 application. topically 2 (two) times daily. For up to 2 weeks. 30 g 0   glucose blood (TRUE METRIX BLOOD GLUCOSE TEST) test strip Check blood glucose daily. Dx: E11.9 100 each 12  losartan-hydrochlorothiazide (HYZAAR) 100-25 MG tablet TAKE 1 TABLET EVERY DAY 90 tablet 10   metFORMIN (GLUCOPHAGE-XR) 500 MG 24 hr tablet TAKE 2 TABLETS TWICE DAILY 360 tablet 3   TRUEplus Lancets 33G MISC Check blood glucose daily Dx: E11.9 100 each 3   No facility-administered medications prior to visit.   No Known Allergies Objective:   Today's Vitals   12/27/22 0847  BP: 114/78  Pulse: 76  Temp: 97.7 F (36.5 C)  TempSrc: Temporal  SpO2: 94%  Weight: 204 lb 12.8 oz (92.9 kg)  Height: 5\' 2"  (1.575 m)   Body mass index is 37.46 kg/m.   General: Well developed, well nourished. No acute distress. Psych: Alert and oriented. Normal mood and affect.  Health  Maintenance Due  Topic Date Due   INFLUENZA VACCINE  11/29/2022     Assessment & Plan:   Problem List Items Addressed This Visit       Cardiovascular and Mediastinum   Essential hypertension    Blood pressure is at goal. Continue amlodipine 5 mg daily, Hyzaar 100-25 mg daily, and carvedilol 12.5 mg twice a day.        Endocrine   Type 2 diabetes mellitus with other specified complication (HCC)    Diabetes has been in excellent control. We will check her A1c today. Continue metformin 500 mg twice a day.      Relevant Orders   Glucose, random   Hemoglobin A1c   Other Visit Diagnoses     Need for immunization against influenza    -  Primary   Relevant Orders   Flu Vaccine Trivalent High Dose (Fluad) (Completed)       Return in about 3 months (around 03/29/2023) for Reassessment.   Loyola Mast, MD

## 2022-12-27 NOTE — Assessment & Plan Note (Signed)
Diabetes has been in excellent control. We will check her A1c today. Continue metformin 500 mg twice a day.

## 2023-01-16 ENCOUNTER — Other Ambulatory Visit: Payer: Self-pay | Admitting: Family Medicine

## 2023-01-16 DIAGNOSIS — I10 Essential (primary) hypertension: Secondary | ICD-10-CM

## 2023-02-05 DIAGNOSIS — H52223 Regular astigmatism, bilateral: Secondary | ICD-10-CM | POA: Diagnosis not present

## 2023-02-05 DIAGNOSIS — E119 Type 2 diabetes mellitus without complications: Secondary | ICD-10-CM | POA: Diagnosis not present

## 2023-02-05 DIAGNOSIS — H2513 Age-related nuclear cataract, bilateral: Secondary | ICD-10-CM | POA: Diagnosis not present

## 2023-02-05 DIAGNOSIS — H25013 Cortical age-related cataract, bilateral: Secondary | ICD-10-CM | POA: Diagnosis not present

## 2023-02-05 DIAGNOSIS — H524 Presbyopia: Secondary | ICD-10-CM | POA: Diagnosis not present

## 2023-02-05 LAB — HM DIABETES EYE EXAM

## 2023-04-01 ENCOUNTER — Encounter: Payer: Self-pay | Admitting: Family Medicine

## 2023-04-01 ENCOUNTER — Ambulatory Visit (INDEPENDENT_AMBULATORY_CARE_PROVIDER_SITE_OTHER): Payer: Medicare HMO | Admitting: Family Medicine

## 2023-04-01 VITALS — BP 128/70 | HR 75 | Temp 97.9°F | Ht 62.0 in | Wt 206.4 lb

## 2023-04-01 DIAGNOSIS — I10 Essential (primary) hypertension: Secondary | ICD-10-CM | POA: Diagnosis not present

## 2023-04-01 DIAGNOSIS — E1169 Type 2 diabetes mellitus with other specified complication: Secondary | ICD-10-CM

## 2023-04-01 DIAGNOSIS — Z7984 Long term (current) use of oral hypoglycemic drugs: Secondary | ICD-10-CM

## 2023-04-01 LAB — HEMOGLOBIN A1C: Hgb A1c MFr Bld: 6.3 % (ref 4.6–6.5)

## 2023-04-01 LAB — GLUCOSE, RANDOM: Glucose, Bld: 108 mg/dL — ABNORMAL HIGH (ref 70–99)

## 2023-04-01 NOTE — Progress Notes (Signed)
[facility] [address] [place] [address] Dept: [phone] Dept Fax: [phone]  Chronic Care Office Visit  Subjective:    Patient ID: Jennifer Saunders, female    DOB: 03-22-1951, 72 y.o..   MRN: [id number]  Chief Complaint  Patient presents with   Diabetes    3 month f/u.   C/o having some dizzy spells off/on.    History of Present Illness:  Patient is in today for reassessment of chronic medical issues.  Jennifer Saunders has a history of Type 2 diabetes, managed with metformin 1000 mg bid.    Jennifer Saunders has hypertension managed with losartan-HCTZ (Hyzaar) 100-25 mg daily, amlodipine 5 mg daily, and carvedilol 12.5 mg bid.  Past Medical History: Patient Active Problem List   Diagnosis Date Noted   Constipation 05/30/2022   Osteopenia 08/19/2020   Neck pain on right side 06/02/2016   NASH (nonalcoholic steatohepatitis) 04/11/2016   History of adenomatous polyp of colon 10/17/2015   Class 2 severe obesity with serious comorbidity and body mass index (BMI) of 37.0 to 37.9 in adult (HCC) 10/17/2015   GERD (gastroesophageal reflux disease) 08/15/2015   Undiagnosed cardiac murmurs 12/17/2012   Varicose veins 08/18/2010   Cerebrovascular disease- small vessel 05/17/2008   History of kidney stones 03/18/2008   History of shingles 04/25/2007   Type 2 diabetes mellitus with other specified complication (HCC) 12/28/2006   Anxiety state 12/28/2006   Essential hypertension 12/28/2006   Past Surgical History:  Procedure Laterality Date   COLONOSCOPY  2019   MS-MAC-suprep(good)-TA   POLYPECTOMY  2019   TA   Family History  Problem Relation Age of Onset   Heart disease Mother        CVA x 2   Breast cancer Mother 22   Colon polyps Sister 48   Intracerebral hemorrhage Sister    Colon cancer Sister 72   Aneurysm Sister    Hypertension Sister    Heart disease Sister    Colon cancer Brother 68   Colon polyps  Brother 44   Diabetes Maternal Aunt    Diabetes Paternal Aunt    Esophageal cancer Neg Hx    Rectal cancer Neg Hx    Stomach cancer Neg Hx    Outpatient Medications Prior to Visit  Medication Sig Dispense Refill   Alcohol Swabs PADS Use daily before blood glucose 100 each 3   amLODipine (NORVASC) 5 MG tablet TAKE 1 TABLET EVERY DAY 90 tablet 3   aspirin 81 MG tablet Take 81 mg by mouth daily.     Blood Glucose Calibration (TRUE METRIX LEVEL 2) Normal SOLN Use daily to check blood glucose controls Dx: E11.9 1 each 2   Blood Glucose Monitoring Suppl (TRUE METRIX METER) w/Device KIT Check blood glucose once daily Dx: E11.9 1 kit 0   Calcium Carbonate-Vit D-Min (CALCIUM 1200 PO) Take 1 tablet by mouth daily at 6 (six) AM.     carvedilol (COREG) 12.5 MG tablet TAKE 1 TABLET TWICE DAILY WITH MEALS 180 tablet 1   cholecalciferol (VITAMIN D3) 25 MCG (1000 UNIT) tablet Take 1,000 Units by mouth daily.     clotrimazole-betamethasone (LOTRISONE) cream Apply 1 application. topically 2 (two) times daily. For up to 2 weeks. 30 g 0   glucose blood (TRUE METRIX BLOOD GLUCOSE TEST) test strip Check blood glucose daily. Dx: E11.9 100 each 12   losartan-hydrochlorothiazide (HYZAAR) 100-25 MG tablet TAKE 1 TABLET EVERY DAY 90 tablet 10  metFORMIN (GLUCOPHAGE-XR) 500 MG 24 hr tablet TAKE 2 TABLETS TWICE DAILY 360 tablet 3   TRUEplus Lancets 33G MISC Check blood glucose daily Dx: E11.9 100 each 3   No facility-administered medications prior to visit.   No Known Allergies Objective:   Today's Vitals   04/01/23 0923  BP: 128/70  Pulse: 75  Temp: 97.9 F (36.6 C)  TempSrc: Temporal  SpO2: 98%  Weight: 206 lb 6.4 oz (93.6 kg)  Height: 5\' 2"  (1.575 m)   Body mass index is 37.75 kg/m.   General: Well developed, well nourished. No acute distress. Feet- Skin intact. No sign of maceration between toes. Nails are normal. Dorsalis pedis and posterior tibial artery   pulses are normal. 5.07 monofilament  testing normal. Psych: Alert and oriented. Normal mood and affect.  Health Maintenance Due  Topic Date Due   Zoster Vaccines- Shingrix (1 of 2) Never done   FOOT EXAM  02/28/2023     Assessment & Plan:   Problem List Items Addressed This Visit       Cardiovascular and Mediastinum   Essential hypertension - Primary    Blood pressure is at goal. Continue amlodipine 5 mg daily, Hyzaar 100-25 mg daily, and carvedilol 12.5 mg twice a day.        Endocrine   Type 2 diabetes mellitus with other specified complication (HCC)    Diabetes has been in excellent control. We will check her A1c today. Continue metformin 500 mg twice a day.      Relevant Orders   Glucose, random   Hemoglobin A1c    Return in about 3 months (around 06/30/2023) for Reassessment.   Loyola Mast, MD

## 2023-04-01 NOTE — Assessment & Plan Note (Signed)
Diabetes has been in excellent control. We will check her A1c today. Continue metformin 500 mg twice a day.

## 2023-04-01 NOTE — Assessment & Plan Note (Signed)
Blood pressure is at goal. Continue amlodipine 5 mg daily, Hyzaar 100-25 mg daily, and carvedilol 12.5 mg twice a day.

## 2023-04-07 ENCOUNTER — Other Ambulatory Visit: Payer: Self-pay | Admitting: Family Medicine

## 2023-04-16 ENCOUNTER — Other Ambulatory Visit: Payer: Self-pay | Admitting: Family Medicine

## 2023-04-16 DIAGNOSIS — Z1231 Encounter for screening mammogram for malignant neoplasm of breast: Secondary | ICD-10-CM

## 2023-05-15 ENCOUNTER — Other Ambulatory Visit: Payer: Self-pay | Admitting: Family Medicine

## 2023-05-15 DIAGNOSIS — L309 Dermatitis, unspecified: Secondary | ICD-10-CM

## 2023-05-15 NOTE — Telephone Encounter (Signed)
 Copied from CRM [id number]. Topic: Clinical - Medication Refill >> May 15, 2023  3:01 PM Freya Jesus wrote: Most Recent Primary Care Visit:  Provider: Graig Lawyer  Department: [facility]  Visit Type: OFFICE VISIT  Date: 04/01/2023  Medication: clotrimazole -betamethasone  (LOTRISONE ) cream [[id number]]  Has the patient contacted their pharmacy? No (Agent: If no, request that the patient contact the pharmacy for the refill. If patient does not wish to contact the pharmacy document the reason why and proceed with request.) (Agent: If yes, when and what did the pharmacy advise?)  Is this the correct pharmacy for this prescription? Yes If no, delete pharmacy and type the correct one.  This is the patient's preferred pharmacy:  [id number] [place], [place] - [address] [place] [address] Phone: [phone] Fax: [phone]    Has the prescription been filled recently? No  Is the patient out of the medication? Yes  Has the patient been seen for an appointment in the last year OR does the patient have an upcoming appointment? Yes  Can we respond through MyChart? Yes  Agent: Please be advised that Rx refills may take up to 3 business days. We ask that you follow-up with your pharmacy.

## 2023-05-16 MED ORDER — CLOTRIMAZOLE-BETAMETHASONE 1-0.05 % EX CREA: 1.0000 | TOPICAL_CREAM | Freq: Two times a day (BID) | CUTANEOUS | 0 refills | Status: AC

## 2023-05-27 ENCOUNTER — Ambulatory Visit
Admission: RE | Admit: 2023-05-27 | Discharge: 2023-05-27 | Disposition: A | Discharge status: Home / Self Care | Payer: Medicare HMO | Source: Ambulatory Visit | Attending: Family Medicine | Admitting: Family Medicine

## 2023-05-27 DIAGNOSIS — Z1231 Encounter for screening mammogram for malignant neoplasm of breast: Secondary | ICD-10-CM | POA: Diagnosis not present

## 2023-06-12 ENCOUNTER — Other Ambulatory Visit: Payer: Self-pay | Admitting: Family Medicine

## 2023-06-12 DIAGNOSIS — I10 Essential (primary) hypertension: Secondary | ICD-10-CM

## 2023-07-01 ENCOUNTER — Ambulatory Visit (INDEPENDENT_AMBULATORY_CARE_PROVIDER_SITE_OTHER): Payer: Medicare HMO

## 2023-07-01 ENCOUNTER — Ambulatory Visit (INDEPENDENT_AMBULATORY_CARE_PROVIDER_SITE_OTHER): Payer: Medicare HMO | Admitting: Family Medicine

## 2023-07-01 ENCOUNTER — Encounter: Payer: Self-pay | Admitting: Family Medicine

## 2023-07-01 VITALS — BP 118/72 | HR 85 | Temp 97.7°F | Ht 62.0 in | Wt 206.0 lb

## 2023-07-01 VITALS — BP 118/72 | HR 85 | Temp 97.7°F | Ht 62.0 in | Wt 206.6 lb

## 2023-07-01 DIAGNOSIS — Z79899 Other long term (current) drug therapy: Secondary | ICD-10-CM

## 2023-07-01 DIAGNOSIS — E1169 Type 2 diabetes mellitus with other specified complication: Secondary | ICD-10-CM | POA: Diagnosis not present

## 2023-07-01 DIAGNOSIS — Z7984 Long term (current) use of oral hypoglycemic drugs: Secondary | ICD-10-CM | POA: Diagnosis not present

## 2023-07-01 DIAGNOSIS — Z0000 Encounter for general adult medical examination without abnormal findings: Secondary | ICD-10-CM | POA: Diagnosis not present

## 2023-07-01 DIAGNOSIS — I10 Essential (primary) hypertension: Secondary | ICD-10-CM

## 2023-07-01 LAB — HEMOGLOBIN A1C: Hgb A1c MFr Bld: 6.3 % (ref 4.6–6.5)

## 2023-07-01 LAB — VITAMIN B12: Vitamin B-12: 270 pg/mL (ref 211–911)

## 2023-07-01 LAB — GLUCOSE, RANDOM: Glucose, Bld: 95 mg/dL (ref 70–99)

## 2023-07-01 NOTE — Patient Instructions (Signed)
 Ms. Bonebrake , Thank you for taking time to come for your Medicare Wellness Visit. I appreciate your ongoing commitment to your health goals. Please review the following plan we discussed and let me know if I can assist you in the future.   Referrals/Orders/Follow-Ups/Clinician Recommendations: none  This is a list of the screening recommended for you and due dates:  Health Maintenance  Topic Date Due   Zoster (Shingles) Vaccine (1 of 2) Never done   COVID-19 Vaccine (5 - 2024-25 season) 07/17/2023*   Yearly kidney function blood test for diabetes  08/27/2023   Yearly kidney health urinalysis for diabetes  08/27/2023   Hemoglobin A1C  09/30/2023   DTaP/Tdap/Td vaccine (3 - Td or Tdap) 01/16/2024   Eye exam for diabetics  02/05/2024   Complete foot exam   03/31/2024   Mammogram  05/26/2024   Medicare Annual Wellness Visit  06/30/2024   Pneumonia Vaccine  Completed   Flu Shot  Completed   DEXA scan (bone density measurement)  Completed   Hepatitis C Screening  Completed   HPV Vaccine  Aged Out   Colon Cancer Screening  Discontinued  *Topic was postponed. The date shown is not the original due date.    Advanced directives: (Declined) Advance directive discussed with you today. Even though you declined this today, please call our office should you change your mind, and we can give you the proper paperwork for you to fill out.  Next Medicare Annual Wellness Visit scheduled for next year: Yes  insert Preventive Care attachment Insert FALL PREVENTION attachment if needed

## 2023-07-01 NOTE — Progress Notes (Signed)
 Subjective:   Jennifer Saunders is a 73 y.o. who presents for a Medicare Wellness preventive visit.  Visit Complete: In person    AWV Questionnaire: No: Patient Medicare AWV questionnaire was not completed prior to this visit.  Cardiac Risk Factors include: advanced age (>69men, >38 women);diabetes mellitus;hypertension     Objective:    Today's Vitals   07/01/23 0903  BP: 118/72  Pulse: 85  Temp: 97.7 F (36.5 C)  TempSrc: Oral  SpO2: 94%  Weight: 206 lb (93.4 kg)  Height: 5\' 2"  (1.575 m)   Body mass index is 37.68 kg/m.     07/01/2023    9:08 AM 06/29/2022    8:45 AM 04/12/2021    1:40 PM 03/28/2020    3:05 PM 06/13/2019    9:34 AM 03/17/2019   10:35 AM 03/12/2018    1:23 PM  Advanced Directives  Does Patient Have a Medical Advance Directive? No Yes Yes Yes No Yes Yes  Type of Special educational needs teacher of [place];Living will Healthcare Power of [place];Living will Living will  Living will;Healthcare Power of Attorney Living will  Copy of Healthcare Power of Attorney in Chart?  No - copy requested No - copy requested   No - copy requested   Would patient like information on creating a medical advance directive? No - Patient declined          Current Medications (verified) Outpatient Encounter Medications as of 07/01/2023  Medication Sig   Alcohol Swabs PADS Use daily before blood glucose   amLODipine (NORVASC) 5 MG tablet TAKE 1 TABLET EVERY DAY   aspirin 81 MG tablet Take 81 mg by mouth daily.   Blood Glucose Calibration (TRUE METRIX LEVEL 2) Normal SOLN Use daily to check blood glucose controls Dx: E11.9   Blood Glucose Monitoring Suppl (TRUE METRIX METER) w/Device KIT Check blood glucose once daily Dx: E11.9   Calcium Carbonate-Vit D-Min (CALCIUM 1200 PO) Take 1 tablet by mouth daily at 6 (six) AM.   carvedilol (COREG) 12.5 MG tablet TAKE 1 TABLET TWICE DAILY WITH MEALS   cholecalciferol (VITAMIN D3) 25 MCG (1000 UNIT) tablet Take 1,000 Units by mouth  daily.   clotrimazole-betamethasone (LOTRISONE) cream Apply 1 Application topically 2 (two) times daily. For up to 2 weeks.   glucose blood (TRUE METRIX BLOOD GLUCOSE TEST) test strip Check blood glucose daily. Dx: E11.9   losartan-hydrochlorothiazide (HYZAAR) 100-25 MG tablet TAKE 1 TABLET EVERY DAY   metFORMIN (GLUCOPHAGE-XR) 500 MG 24 hr tablet TAKE 2 TABLETS TWICE DAILY   TRUEplus Lancets 33G MISC Check blood glucose daily Dx: E11.9   No facility-administered encounter medications on file as of 07/01/2023.    Allergies (verified) Patient has no known allergies.   History: Past Medical History:  Diagnosis Date   Adenomatous colon polyp 2003   Allergy    seasonal   Anxiety    Diabetes mellitus    on meds   Dyslipidemia    GERD (gastroesophageal reflux disease)    on meds-OTC PRN meds-with certain foods;   Hemorrhoids    hx of   Hypertension    on meds   Urolithiasis    Past Surgical History:  Procedure Laterality Date   COLONOSCOPY  2019   MS-MAC-suprep(good)-TA   POLYPECTOMY  2019   TA   Family History  Problem Relation Age of Onset   Heart disease Mother        CVA x 2   Breast cancer Mother 60  Colon polyps Sister 55   Intracerebral hemorrhage Sister    Colon cancer Sister 8   Aneurysm Sister    Hypertension Sister    Heart disease Sister    Colon cancer Brother 42   Colon polyps Brother 21   Diabetes Maternal Aunt    Diabetes Paternal Aunt    Esophageal cancer Neg Hx    Rectal cancer Neg Hx    Stomach cancer Neg Hx    Social History   Socioeconomic History   Marital status: Divorced    Spouse name: Not on file   Number of children: Not on file   Years of education: Not on file   Highest education level: 12th grade  Occupational History   Occupation: Retired  Tobacco Use   Smoking status: Former    Current packs/day: 0.00    Types: Cigarettes    Quit date: 1975    Years since quitting: 50.2   Smokeless tobacco: Never   Tobacco comments:     quit age 75  Vaping Use   Vaping status: Never Used  Substance and Sexual Activity   Alcohol use: No    Alcohol/week: 0.0 standard drinks of alcohol   Drug use: No   Sexual activity: Not Currently  Other Topics Concern   Not on file  Social History Narrative   Not on file   Social Drivers of Health   Financial Resource Strain: Low Risk  (07/01/2023)   Overall Financial Resource Strain (CARDIA)    Difficulty of Paying Living Expenses: Not hard at all  Food Insecurity: No Food Insecurity (07/01/2023)   Hunger Vital Sign    Worried About Running Out of Food in the Last Year: Never true    Ran Out of Food in the Last Year: Never true  Transportation Needs: No Transportation Needs (07/01/2023)   PRAPARE - Administrator, Civil Service (Medical): No    Lack of Transportation (Non-Medical): No  Physical Activity: Insufficiently Active (07/01/2023)   Exercise Vital Sign    Days of Exercise per Week: 3 days    Minutes of Exercise per Session: 20 min  Stress: No Stress Concern Present (07/01/2023)   [employer] of Occupational Health - Occupational Stress Questionnaire    Feeling of Stress : Not at all  Social Connections: Moderately Integrated (07/01/2023)   Social Connection and Isolation Panel [NHANES]    Frequency of Communication with Friends and Family: More than three times a week    Frequency of Social Gatherings with Friends and Family: More than three times a week    Attends Religious Services: More than 4 times per year    Active Member of [employer] or Organizations: Yes    Attends Engineer, structural: More than 4 times per year    Marital Status: Divorced    Tobacco Counseling Counseling given: Not Answered Tobacco comments: quit age 34    Clinical Intake:  Pre-visit preparation completed: Yes  Pain : No/denies pain     Nutritional Status: BMI > 30  Obese Nutritional Risks: None Diabetes: Yes CBG done?: No Did pt. bring in CBG monitor from  home?: No  How often do you need to have someone help you when you read instructions, pamphlets, or other written materials from your doctor or pharmacy?: 1 - Never  Interpreter Needed?: No  Information entered by :: NAllen LPN   Activities of Daily Living     07/01/2023    9:04 AM  In your present  state of health, do you have any difficulty performing the following activities:  Hearing? 0  Vision? 0  Difficulty concentrating or making decisions? 0  Walking or climbing stairs? 0  Dressing or bathing? 0  Doing errands, shopping? 0  Preparing Food and eating ? N  Using the Toilet? N  In the past six months, have you accidently leaked urine? N  Do you have problems with loss of bowel control? N  Managing your Medications? N  Managing your Finances? N  Housekeeping or managing your Housekeeping? N    Patient Care Team: Loyola Mast, MD as PCP - General (Family Medicine) Romero Belling, MD (Inactive) as Consulting Physician (Endocrinology) Blair Promise, OD (Optometry)  Indicate any recent Medical Services you may have received from other than [facility] providers in the past year (date may be approximate).     Assessment:   This is a routine wellness examination for [place].  Hearing/Vision screen Hearing Screening - Comments:: Denies hearing issues Vision Screening - Comments:: Regular eye exams, [facility]   Goals Addressed             This Visit's Progress    Patient Stated       07/01/2023, wants to get exercise to 4 times a week       Depression Screen     07/01/2023    9:10 AM 07/01/2023    8:28 AM 12/27/2022    8:54 AM 06/29/2022    8:46 AM 05/30/2022    7:56 AM 04/12/2021    1:45 PM 04/12/2021    1:38 PM  PHQ 2/9 Scores  PHQ - 2 Score 0 0 0 0 0 0 0  PHQ- 9 Score 1          Fall Risk     07/01/2023    9:09 AM 07/01/2023    8:28 AM 12/27/2022    8:48 AM 06/29/2022    8:46 AM 06/25/2022    8:46 AM  Fall Risk   Falls in the past year? 0 0 0 0 0   Number falls in past yr: 0 0 0 0 0  Injury with Fall? 0 0 0 0 0  Risk for fall due to : Medication side effect No Fall Risks No Fall Risks Medication side effect   Follow up Falls prevention discussed;Falls evaluation completed Falls evaluation completed Falls evaluation completed Falls prevention discussed;Education provided;Falls evaluation completed     MEDICARE RISK AT HOME:  Medicare Risk at Home Any stairs in or around the home?: No If so, are there any without handrails?: No Home free of loose throw rugs in walkways, pet beds, electrical cords, etc?: Yes Adequate lighting in your home to reduce risk of falls?: Yes Life alert?: No Use of a cane, walker or w/c?: No Grab bars in the bathroom?: Yes Shower chair or bench in shower?: No Elevated toilet seat or a handicapped toilet?: No  TIMED UP AND GO:  Was the test performed?  Yes  Length of time to ambulate 10 feet: 5 sec Gait steady and fast without use of assistive device  Cognitive Function: 6CIT completed    03/17/2019   10:49 AM 03/12/2018    1:24 PM  MMSE - Mini Mental State Exam  Orientation to time 5 5  Orientation to Place 5 5  Registration 3 3  Attention/ Calculation 5 5  Recall 2 0  Language- name 2 objects 2 2  Language- repeat 1 1  Language- follow 3 step  command 3 3  Language- read & follow direction 1 1  Write a sentence 1 1  Copy design 1 1  Total score 29 27        07/01/2023    9:11 AM 06/29/2022    8:47 AM  6CIT Screen  What Year? 0 points 0 points  What month? 0 points 0 points  What time? 0 points 0 points  Count back from 20 0 points 0 points  Months in reverse 2 points 2 points  Repeat phrase 0 points 0 points  Total Score 2 points 2 points    Immunizations Immunization History  Administered Date(s) Administered   Fluad Trivalent(High Dose 65+) 12/27/2022   Influenza, High Dose Seasonal PF 02/25/2014, 03/03/2018, 02/05/2019   Influenza,inj,Quad PF,6+ Mos 03/07/2017    Influenza-Unspecified 01/29/2020, 02/14/2021, 01/10/2022   PFIZER(Purple Top)SARS-COV-2 Vaccination 07/11/2019, 08/04/2019, 04/25/2020   Pfizer Covid-19 Vaccine Bivalent Booster 72yrs & up 03/08/2022   Pneumococcal Conjugate-13 01/15/2014   Pneumococcal Polysaccharide-23 05/24/2009, 05/30/2016   Td 08/28/1997   Tdap 01/15/2014    Screening Tests Health Maintenance  Topic Date Due   Zoster Vaccines- Shingrix (1 of 2) Never done   COVID-19 Vaccine (5 - 2024-25 season) 07/17/2023 (Originally 12/30/2022)   Diabetic kidney evaluation - eGFR measurement  08/27/2023   Diabetic kidney evaluation - Urine ACR  08/27/2023   HEMOGLOBIN A1C  09/30/2023   DTaP/Tdap/Td (3 - Td or Tdap) 01/16/2024   OPHTHALMOLOGY EXAM  02/05/2024   FOOT EXAM  03/31/2024   MAMMOGRAM  05/26/2024   Medicare Annual Wellness (AWV)  06/30/2024   Pneumonia Vaccine 23+ Years old  Completed   INFLUENZA VACCINE  Completed   DEXA SCAN  Completed   Hepatitis C Screening  Completed   HPV VACCINES  Aged Out   Colonoscopy  Discontinued    Health Maintenance  Health Maintenance Due  Topic Date Due   Zoster Vaccines- Shingrix (1 of 2) Never done   Health Maintenance Items Addressed: Due for shingles vaccine  Additional Screening:  Vision Screening: Recommended annual ophthalmology exams for early detection of glaucoma and other disorders of the eye.  Dental Screening: Recommended annual dental exams for proper oral hygiene  Community Resource Referral / Chronic Care Management: CRR required this visit?  No   CCM required this visit?  No     Plan:     I have personally reviewed and noted the following in the patient's chart:   Medical and social history Use of alcohol, tobacco or illicit drugs  Current medications and supplements including opioid prescriptions. Patient is not currently taking opioid prescriptions. Functional ability and status Nutritional status Physical activity Advanced directives List  of other physicians Hospitalizations, surgeries, and ER visits in previous 12 months Vitals Screenings to include cognitive, depression, and falls Referrals and appointments  In addition, I have reviewed and discussed with patient certain preventive protocols, quality metrics, and best practice recommendations. A written personalized care plan for preventive services as well as general preventive health recommendations were provided to patient.     Barb Merino, LPN   09/30/1306   After Visit Summary: (In Person-Declined) Patient declined AVS at this time.  Notes: Nothing significant to report at this time.

## 2023-07-01 NOTE — Progress Notes (Signed)
 [facility] [address] [place] [address] Dept: [phone] Dept Fax: [phone]  Chronic Care Office Visit  Subjective:    Patient ID: Jennifer Saunders, female    DOB: 17-Nov-1950, 73 y.o..   MRN: [id number]  Chief Complaint  Patient presents with   Hypertension    3 month f/u.  No concerns.  Fasting today.    History of Present Illness:  Patient is in today for reassessment of chronic medical issues.  Jennifer Saunders has a history of Type 2 diabetes, managed with metformin 1000 mg bid. Jennifer Saunders notes that a speaker at her senior group recently told them that all seniors should be taking Vitamin B12. She is not interested in taking additional medicines, but wonders about this recommendation.   Jennifer Saunders has hypertension managed with losartan-HCTZ (Hyzaar) 100-25 mg daily, amlodipine 5 mg daily, and carvedilol 12.5 mg bid.  Past Medical History: Patient Active Problem List   Diagnosis Date Noted   Constipation 05/30/2022   Osteopenia 08/19/2020   Neck pain on right side 06/02/2016   NASH (nonalcoholic steatohepatitis) 04/11/2016   History of adenomatous polyp of colon 10/17/2015   Class 2 severe obesity with serious comorbidity and body mass index (BMI) of 37.0 to 37.9 in adult (HCC) 10/17/2015   GERD (gastroesophageal reflux disease) 08/15/2015   Undiagnosed cardiac murmurs 12/17/2012   Varicose veins 08/18/2010   Cerebrovascular disease- small vessel 05/17/2008   History of kidney stones 03/18/2008   History of shingles 04/25/2007   Type 2 diabetes mellitus with other specified complication (HCC) 12/28/2006   Anxiety state 12/28/2006   Essential hypertension 12/28/2006   Past Surgical History:  Procedure Laterality Date   COLONOSCOPY  2019   MS-MAC-suprep(good)-TA   POLYPECTOMY  2019   TA   Family History  Problem Relation Age of Onset   Heart disease Mother        CVA x 2   Breast cancer Mother  4   Colon polyps Sister 41   Intracerebral hemorrhage Sister    Colon cancer Sister 34   Aneurysm Sister    Hypertension Sister    Heart disease Sister    Colon cancer Brother 3   Colon polyps Brother 25   Diabetes Maternal Aunt    Diabetes Paternal Aunt    Esophageal cancer Neg Hx    Rectal cancer Neg Hx    Stomach cancer Neg Hx    Outpatient Medications Prior to Visit  Medication Sig Dispense Refill   amLODipine (NORVASC) 5 MG tablet TAKE 1 TABLET EVERY DAY 90 tablet 3   aspirin 81 MG tablet Take 81 mg by mouth daily.     Blood Glucose Calibration (TRUE METRIX LEVEL 2) Normal SOLN Use daily to check blood glucose controls Dx: E11.9 1 each 2   Blood Glucose Monitoring Suppl (TRUE METRIX METER) w/Device KIT Check blood glucose once daily Dx: E11.9 1 kit 0   Calcium Carbonate-Vit D-Min (CALCIUM 1200 PO) Take 1 tablet by mouth daily at 6 (six) AM.     carvedilol (COREG) 12.5 MG tablet TAKE 1 TABLET TWICE DAILY WITH MEALS 180 tablet 3   cholecalciferol (VITAMIN D3) 25 MCG (1000 UNIT) tablet Take 1,000 Units by mouth daily.     clotrimazole-betamethasone (LOTRISONE) cream Apply 1 Application topically 2 (two) times daily. For up to 2 weeks. 30 g 0   glucose blood (TRUE METRIX BLOOD GLUCOSE TEST) test strip Check blood glucose daily. Dx: E11.9 100 each  12   losartan-hydrochlorothiazide (HYZAAR) 100-25 MG tablet TAKE 1 TABLET EVERY DAY 90 tablet 2   metFORMIN (GLUCOPHAGE-XR) 500 MG 24 hr tablet TAKE 2 TABLETS TWICE DAILY 360 tablet 3   TRUEplus Lancets 33G MISC Check blood glucose daily Dx: E11.9 100 each 3   Alcohol Swabs PADS Use daily before blood glucose 100 each 3   No facility-administered medications prior to visit.   No Known Allergies Objective:   Today's Vitals   07/01/23 0824  BP: 118/72  Pulse: 85  Temp: 97.7 F (36.5 C)  TempSrc: Temporal  SpO2: 94%  Weight: 206 lb 9.6 oz (93.7 kg)  Height: 5\' 2"  (1.575 m)   Body mass index is 37.79 kg/m.   General: Well  developed, well nourished. No acute distress. Psych: Alert and oriented. Normal mood and affect.  Health Maintenance Due  Topic Date Due   Zoster Vaccines- Shingrix (1 of 2) Never done   Medicare Annual Wellness (AWV)  06/29/2023     Assessment & Plan:   Problem List Items Addressed This Visit       Cardiovascular and Mediastinum   Essential hypertension - Primary   Blood pressure is at goal. Continue amlodipine 5 mg daily, Hyzaar 100-25 mg daily, and carvedilol 12.5 mg twice a day.        Endocrine   Type 2 diabetes mellitus with other specified complication (HCC)   Diabetes has been in excellent control. We will check her A1c today. Continue metformin 500 mg twice a day.      Relevant Orders   Glucose, random   Hemoglobin A1c   Other Visit Diagnoses       Long-term use of high-risk medication       Discussed Vitmain B12, dietary sources and causes of low B12. Metformin can lead to low B12. I will check a level.   Relevant Orders   Vitamin B12       Return in about 3 months (around 10/01/2023) for Reassessment.   Loyola Mast, MD

## 2023-07-01 NOTE — Assessment & Plan Note (Signed)
Blood pressure is at goal. Continue amlodipine 5 mg daily, Hyzaar 100-25 mg daily, and carvedilol 12.5 mg twice a day.

## 2023-07-01 NOTE — Assessment & Plan Note (Signed)
Diabetes has been in excellent control. We will check her A1c today. Continue metformin 500 mg twice a day.

## 2023-08-17 ENCOUNTER — Other Ambulatory Visit: Payer: Self-pay | Admitting: Family Medicine

## 2023-08-17 DIAGNOSIS — I10 Essential (primary) hypertension: Secondary | ICD-10-CM

## 2023-10-01 ENCOUNTER — Ambulatory Visit: Payer: Self-pay | Admitting: Family Medicine

## 2023-10-01 ENCOUNTER — Ambulatory Visit: Admitting: Family Medicine

## 2023-10-01 ENCOUNTER — Encounter: Payer: Self-pay | Admitting: Family Medicine

## 2023-10-01 VITALS — BP 118/76 | HR 84 | Temp 97.5°F | Ht 62.0 in | Wt 205.4 lb

## 2023-10-01 DIAGNOSIS — E1169 Type 2 diabetes mellitus with other specified complication: Secondary | ICD-10-CM

## 2023-10-01 DIAGNOSIS — I10 Essential (primary) hypertension: Secondary | ICD-10-CM | POA: Diagnosis not present

## 2023-10-01 DIAGNOSIS — K7581 Nonalcoholic steatohepatitis (NASH): Secondary | ICD-10-CM | POA: Diagnosis not present

## 2023-10-01 DIAGNOSIS — Z7984 Long term (current) use of oral hypoglycemic drugs: Secondary | ICD-10-CM

## 2023-10-01 LAB — CBC
HCT: 42.9 % (ref 36.0–46.0)
Hemoglobin: 14.3 g/dL (ref 12.0–15.0)
MCHC: 33.3 g/dL (ref 30.0–36.0)
MCV: 89.9 fl (ref 78.0–100.0)
Platelets: 296 10*3/uL (ref 150.0–400.0)
RBC: 4.77 Mil/uL (ref 3.87–5.11)
RDW: 13.5 % (ref 11.5–15.5)
WBC: 10.4 10*3/uL (ref 4.0–10.5)

## 2023-10-01 LAB — URINALYSIS, ROUTINE W REFLEX MICROSCOPIC
Bilirubin Urine: NEGATIVE
Hgb urine dipstick: NEGATIVE
Ketones, ur: NEGATIVE
Leukocytes,Ua: NEGATIVE
Nitrite: NEGATIVE
Specific Gravity, Urine: >=1.03 — AB (ref 1.000–1.030)
Total Protein, Urine: NEGATIVE
Urine Glucose: NEGATIVE
Urobilinogen, UA: 0.2 (ref 0.0–1.0)
pH: 6 (ref 5.0–8.0)

## 2023-10-01 LAB — COMPREHENSIVE METABOLIC PANEL WITH GFR
ALT: 28 U/L (ref 0–35)
AST: 25 U/L (ref 0–37)
Albumin: 4.1 g/dL (ref 3.5–5.2)
Alkaline Phosphatase: 72 U/L (ref 39–117)
BUN: 14 mg/dL (ref 6–23)
CO2: 33 meq/L — ABNORMAL HIGH (ref 19–32)
Calcium: 10.8 mg/dL — ABNORMAL HIGH (ref 8.4–10.5)
Chloride: 100 meq/L (ref 96–112)
Creatinine, Ser: 0.93 mg/dL (ref 0.40–1.20)
GFR: 60.38 mL/min (ref >60.00)
Glucose, Bld: 111 mg/dL — ABNORMAL HIGH (ref 70–99)
Potassium: 3.6 meq/L (ref 3.5–5.1)
Sodium: 139 meq/L (ref 135–145)
Total Bilirubin: 0.5 mg/dL (ref 0.2–1.2)
Total Protein: 7.1 g/dL (ref 6.0–8.3)

## 2023-10-01 LAB — MICROALBUMIN / CREATININE URINE RATIO
Creatinine,U: 241.8 mg/dL
Microalb Creat Ratio: 4 mg/g (ref 0.0–30.0)
Microalb, Ur: 1 mg/dL (ref 0.0–1.9)

## 2023-10-01 LAB — LIPID PANEL
Cholesterol: 115 mg/dL (ref 0–200)
HDL: 42.4 mg/dL (ref >39.00)
LDL Cholesterol: 54 mg/dL (ref 0–99)
NonHDL: 72.48
Total CHOL/HDL Ratio: 3
Triglycerides: 92 mg/dL (ref 0.0–149.0)
VLDL: 18.4 mg/dL (ref 0.0–40.0)

## 2023-10-01 LAB — HEMOGLOBIN A1C: Hgb A1c MFr Bld: 6.3 % (ref 4.6–6.5)

## 2023-10-01 NOTE — Assessment & Plan Note (Signed)
 I will reassess liver enzymes and CBC today.

## 2023-10-01 NOTE — Progress Notes (Signed)
 [facility] [address] [place] [address] Dept: [phone] Dept Fax: [phone]  Chronic Care Office Visit  Subjective:    Patient ID: Jennifer Saunders, female    DOB: [birth year], 73 y.o..   MRN: [id number]  Chief Complaint  Patient presents with   Hypertension    3 month f/u HTN.   C/o having some bloating in the abdomen.    History of Present Illness:  Patient is in today for reassessment of chronic medical issues.  Jennifer Saunders has a history of Type 2 diabetes, managed with metformin  1000 mg bid. She notes she engages in chair exercises at least every other day.   Jennifer Saunders has hypertension. She is managed with losartan -HCTZ (Hyzaar) 100-25 mg daily, amlodipine  5 mg daily, and carvedilol  12.5 mg bid.  Past Medical History: Patient Active Problem List   Diagnosis Date Noted   Constipation 05/30/2022   Osteopenia 08/19/2020   Neck pain on right side 06/02/2016   NASH (nonalcoholic steatohepatitis) 04/11/2016   History of adenomatous polyp of colon 10/17/2015   Class 2 severe obesity with serious comorbidity and body mass index (BMI) of 37.0 to 37.9 in adult (HCC) 10/17/2015   GERD (gastroesophageal reflux disease) 08/15/2015   Undiagnosed cardiac murmurs 12/17/2012   Varicose veins 08/18/2010   Cerebrovascular disease- small vessel 05/17/2008   History of kidney stones 03/18/2008   History of shingles 04/25/2007   Type 2 diabetes mellitus with other specified complication (HCC) 12/28/2006   Anxiety state 12/28/2006   Essential hypertension 12/28/2006   Past Surgical History:  Procedure Laterality Date   COLONOSCOPY  2019   MS-MAC-suprep(good)-TA   POLYPECTOMY  2019   TA   Family History  Problem Relation Age of Onset   Heart disease Mother        CVA x 2   Breast cancer Mother 47   Colon polyps Sister 78   Intracerebral hemorrhage Sister    Colon cancer Sister 43   Aneurysm Sister     Hypertension Sister    Heart disease Sister    Colon cancer Brother 50   Colon polyps Brother 78   Diabetes Maternal Aunt    Diabetes Paternal Aunt    Esophageal cancer Neg Hx    Rectal cancer Neg Hx    Stomach cancer Neg Hx    Outpatient Medications Prior to Visit  Medication Sig Dispense Refill   Alcohol  Swabs  PADS Use daily before blood glucose 100 each 3   amLODipine  (NORVASC ) 5 MG tablet TAKE 1 TABLET EVERY DAY 90 tablet 3   aspirin 81 MG tablet Take 81 mg by mouth daily.     Blood Glucose Calibration (TRUE METRIX LEVEL 2) Normal SOLN Use daily to check blood glucose controls Dx: E11.9 1 each 2   Blood Glucose Monitoring Suppl (TRUE METRIX METER) w/Device KIT Check blood glucose once daily Dx: E11.9 1 kit 0   Calcium  Carbonate-Vit D-Min (CALCIUM  1200 PO) Take 1 tablet by mouth daily at 6 (six) AM.     carvedilol  (COREG ) 12.5 MG tablet TAKE 1 TABLET TWICE DAILY WITH MEALS 180 tablet 3   cholecalciferol (VITAMIN D3) 25 MCG (1000 UNIT) tablet Take 1,000 Units by mouth daily.     clotrimazole -betamethasone  (LOTRISONE ) cream Apply 1 Application topically 2 (two) times daily. For up to 2 weeks. 30 g 0   glucose blood (TRUE METRIX BLOOD GLUCOSE TEST) test strip Check blood glucose daily. Dx: E11.9 100 each 12  losartan -hydrochlorothiazide (HYZAAR) 100-25 MG tablet TAKE 1 TABLET EVERY DAY 90 tablet 2   metFORMIN  (GLUCOPHAGE -XR) 500 MG 24 hr tablet TAKE 2 TABLETS TWICE DAILY 360 tablet 3   TRUEplus Lancets 33G MISC Check blood glucose daily Dx: E11.9 100 each 3   No facility-administered medications prior to visit.   No Known Allergies Objective:   Today's Vitals   10/01/23 0802  BP: 118/76  Pulse: 84  Temp: (!) 97.5 F (36.4 C)  TempSrc: Temporal  SpO2: 97%  Weight: 205 lb 6.4 oz (93.2 kg)  Height: 5\' 2"  (1.575 m)   Body mass index is 37.57 kg/m.   General: Well developed, well nourished. No acute distress. Psych: Alert and oriented. Normal mood and affect.  Health  Maintenance Due  Topic Date Due   Zoster Vaccines- Shingrix (1 of 2) Never done   Diabetic kidney evaluation - eGFR measurement  08/27/2023   Diabetic kidney evaluation - Urine ACR  08/27/2023   Lab Results Lab Results  Component Value Date   HGBA1C 6.3 07/01/2023   HGBA1C 6.3 04/01/2023   HGBA1C 6.3 12/27/2022     Assessment & Plan:   Problem List Items Addressed This Visit       Cardiovascular and Mediastinum   Essential hypertension - Primary   Blood pressure is at goal. Continue amlodipine  5 mg daily, Hyzaar 100-25 mg daily, and carvedilol  12.5 mg twice a day. Check annual renal labs today.      Relevant Orders   Microalbumin / creatinine urine ratio   Urinalysis, Routine w reflex microscopic   Comprehensive metabolic panel with GFR     Digestive   NASH (nonalcoholic steatohepatitis)   I will reassess liver enzymes and CBC today.      Relevant Orders   Comprehensive metabolic panel with GFR   CBC     Endocrine   Type 2 diabetes mellitus with other specified complication (HCC)   Diabetes has been in excellent control. We will check annual DM labs today. Continue metformin  500 mg twice a day.      Relevant Orders   Lipid panel   Microalbumin / creatinine urine ratio   Hemoglobin A1c   Urinalysis, Routine w reflex microscopic   Comprehensive metabolic panel with GFR    Return in about 3 months (around 01/01/2024) for Reassessment.   Graig Lawyer, MD

## 2023-10-01 NOTE — Assessment & Plan Note (Signed)
 Diabetes has been in excellent control. We will check annual DM labs today. Continue metformin  500 mg twice a day.

## 2023-10-01 NOTE — Assessment & Plan Note (Signed)
 Blood pressure is at goal. Continue amlodipine  5 mg daily, Hyzaar 100-25 mg daily, and carvedilol  12.5 mg twice a day. Check annual renal labs today.

## 2023-10-03 ENCOUNTER — Telehealth: Payer: Self-pay

## 2023-10-03 NOTE — Telephone Encounter (Signed)
 Copied from CRM [id number]. Topic: Clinical - Medication Question >> Oct 03, 2023  9:06 AM Marlan Silva wrote: Reason for CRM: Patient called in stating that she saw her tests results and they say her calcium  is up and she wants to know if she should stay on her supplements or stop. Patient is requesting a call back at [phone].

## 2023-10-03 NOTE — Telephone Encounter (Signed)
 Please review message and advise.  Dm/cma

## 2023-10-04 ENCOUNTER — Encounter: Payer: Self-pay | Admitting: Family Medicine

## 2023-10-16 ENCOUNTER — Other Ambulatory Visit: Payer: Self-pay | Admitting: Family Medicine

## 2023-10-16 DIAGNOSIS — E119 Type 2 diabetes mellitus without complications: Secondary | ICD-10-CM

## 2023-11-16 ENCOUNTER — Other Ambulatory Visit: Payer: Self-pay | Admitting: Family Medicine

## 2024-01-01 ENCOUNTER — Ambulatory Visit (INDEPENDENT_AMBULATORY_CARE_PROVIDER_SITE_OTHER): Admitting: Family Medicine

## 2024-01-01 ENCOUNTER — Ambulatory Visit: Payer: Self-pay | Admitting: Family Medicine

## 2024-01-01 ENCOUNTER — Encounter: Payer: Self-pay | Admitting: Family Medicine

## 2024-01-01 VITALS — BP 122/74 | HR 81 | Temp 97.5°F | Ht 62.0 in | Wt 204.2 lb

## 2024-01-01 DIAGNOSIS — E1169 Type 2 diabetes mellitus with other specified complication: Secondary | ICD-10-CM | POA: Diagnosis not present

## 2024-01-01 DIAGNOSIS — Z7984 Long term (current) use of oral hypoglycemic drugs: Secondary | ICD-10-CM

## 2024-01-01 DIAGNOSIS — I10 Essential (primary) hypertension: Secondary | ICD-10-CM | POA: Diagnosis not present

## 2024-01-01 DIAGNOSIS — Z23 Encounter for immunization: Secondary | ICD-10-CM | POA: Diagnosis not present

## 2024-01-01 LAB — BASIC METABOLIC PANEL WITH GFR
BUN: 12 mg/dL (ref 6–23)
CO2: 27 meq/L (ref 19–32)
Calcium: 9.8 mg/dL (ref 8.4–10.5)
Chloride: 102 meq/L (ref 96–112)
Creatinine, Ser: 0.86 mg/dL (ref 0.40–1.20)
GFR: 66.21 mL/min (ref >60.00)
Glucose, Bld: 103 mg/dL — ABNORMAL HIGH (ref 70–99)
Potassium: 3.8 meq/L (ref 3.5–5.1)
Sodium: 141 meq/L (ref 135–145)

## 2024-01-01 LAB — HEMOGLOBIN A1C: Hgb A1c MFr Bld: 6.5 % (ref 4.6–6.5)

## 2024-01-01 NOTE — Assessment & Plan Note (Signed)
Diabetes has been in excellent control. We will check her A1c today. Continue metformin 500 mg twice a day.

## 2024-01-01 NOTE — Assessment & Plan Note (Signed)
 Blood pressure is at goal. Continue amlodipine  5 mg daily, losartan -HCTZ (Hyzaar) 100-25 mg daily, and carvedilol  12.5 mg twice a day.

## 2024-01-01 NOTE — Progress Notes (Signed)
 [facility] [address] [place] [address] Dept: [phone] Dept Fax: [phone]  Chronic Care Office Visit  Subjective:    Patient ID: Jennifer Saunders, female    DOB: February 22, 1951, 73 y.o..   MRN: [id number]  Chief Complaint  Patient presents with   Hypertension    3 month f/u HTN, no concerns.  Saunders flu shot today.    History of Present Illness:  Patient is in today for reassessment of chronic medical issues.  Jennifer Saunders has a history of Type 2 diabetes, managed with metformin  1000 mg bid. She notes she engages in chair exercises at least every other day. These sessions can be up to 30 minutes.   Jennifer Saunders has hypertension. She is managed with losartan -HCTZ (Hyzaar) 100-25 mg daily, amlodipine  5 mg daily, and carvedilol  12.5 mg bid.  At her last visit, Jennifer Saunders had a mildly elevated calcium  level.  Past Medical History: Patient Active Problem List   Diagnosis Date Noted   Constipation 05/30/2022   Osteopenia 08/19/2020   Neck pain on right side 06/02/2016   NASH (nonalcoholic steatohepatitis) 04/11/2016   History of adenomatous polyp of colon 10/17/2015   Class 2 severe obesity with serious comorbidity and body mass index (BMI) of 37.0 to 37.9 in adult (HCC) 10/17/2015   GERD (gastroesophageal reflux disease) 08/15/2015   Undiagnosed cardiac murmurs 12/17/2012   Varicose veins 08/18/2010   Cerebrovascular disease- small vessel 05/17/2008   History of kidney stones 03/18/2008   History of shingles 04/25/2007   Type 2 diabetes mellitus with other specified complication (HCC) 12/28/2006   Anxiety state 12/28/2006   Essential hypertension 12/28/2006   Past Surgical History:  Procedure Laterality Date   COLONOSCOPY  2019   MS-MAC-suprep(good)-TA   POLYPECTOMY  2019   TA   Family History  Problem Relation Age of Onset   Heart disease Mother        CVA x 2   Breast cancer Mother 73   Cancer  Mother    Colon polyps Sister 25   Intracerebral hemorrhage Sister    Colon cancer Sister 59   Cancer Sister    Aneurysm Sister    Hypertension Sister    Heart disease Sister    Colon cancer Brother 44   Colon polyps Brother 54   Diabetes Maternal Aunt    Diabetes Paternal Aunt    Esophageal cancer Neg Hx    Rectal cancer Neg Hx    Stomach cancer Neg Hx    Outpatient Medications Prior to Visit  Medication Sig Dispense Refill   Alcohol  Swabs  PADS Use daily before blood glucose 100 each 3   amLODipine  (NORVASC ) 5 MG tablet TAKE 1 TABLET EVERY DAY 90 tablet 3   aspirin 81 MG tablet Take 81 mg by mouth daily.     Blood Glucose Calibration (TRUE METRIX LEVEL 2) Normal SOLN Use daily to check blood glucose controls Dx: E11.9 1 each 2   Blood Glucose Monitoring Suppl (TRUE METRIX METER) w/Device KIT Check blood glucose once daily Dx: E11.9 1 kit 0   Calcium  Carbonate-Vit D-Min (CALCIUM  1200 PO) Take 1 tablet by mouth daily at 6 (six) AM.     carvedilol  (COREG ) 12.5 MG tablet TAKE 1 TABLET TWICE DAILY WITH MEALS 180 tablet 3   cholecalciferol (VITAMIN D3) 25 MCG (1000 UNIT) tablet Take 1,000 Units by mouth daily.     clotrimazole -betamethasone  (LOTRISONE ) cream Apply 1 Application topically 2 (two) times daily. For  up to 2 weeks. 30 g 0   glucose blood (TRUE METRIX BLOOD GLUCOSE TEST) test strip Check blood glucose daily. Dx: E11.9 100 each 12   losartan -hydrochlorothiazide (HYZAAR) 100-25 MG tablet TAKE 1 TABLET EVERY DAY 90 tablet 3   metFORMIN  (GLUCOPHAGE -XR) 500 MG 24 hr tablet TAKE 2 TABLETS TWICE DAILY 360 tablet 3   TRUEplus Lancets 33G MISC Check blood glucose daily Dx: E11.9 100 each 3   No facility-administered medications prior to visit.   No Known Allergies Objective:   Today's Vitals   01/01/24 0849  BP: 122/74  Pulse: 81  Temp: (!) 97.5 F (36.4 C)  TempSrc: Temporal  SpO2: 99%  Weight: 204 lb 3.2 oz (92.6 kg)  Height: 5' 2 (1.575 m)   Body mass index is 37.35  kg/m.   General: Well developed, well nourished. No acute distress. Psych: Alert and oriented. Normal mood and affect.  Health Maintenance Due  Topic Date Due   Zoster Vaccines- Shingrix (1 of 2) Never done   Lab Results    Latest Ref Rng & Units 10/01/2023    8:42 AM 07/01/2023    8:52 AM 04/01/2023    9:45 AM  CMP  Glucose 70 - 99 mg/dL 888  95  891   BUN 6 - 23 mg/dL 14     Creatinine 9.59 - 1.20 mg/dL 9.06     Sodium 864 - 854 mEq/L 139     Potassium 3.5 - 5.1 mEq/L 3.6     Chloride 96 - 112 mEq/L 100     CO2 19 - 32 mEq/L 33     Calcium  8.4 - 10.5 mg/dL 89.1     Total Protein 6.0 - 8.3 g/dL 7.1     Total Bilirubin 0.2 - 1.2 mg/dL 0.5     Alkaline Phos 39 - 117 U/L 72     AST 0 - 37 U/L 25     ALT 0 - 35 U/L 28      Last lipids Lab Results  Component Value Date   CHOL 115 10/01/2023   HDL 42.40 10/01/2023   LDLCALC 54 10/01/2023   TRIG 92.0 10/01/2023   CHOLHDL 3 10/01/2023   Last hemoglobin A1c Lab Results  Component Value Date   HGBA1C 6.3 10/01/2023   Assessment & Plan:   Problem List Items Addressed This Visit       Cardiovascular and Mediastinum   Essential hypertension   Blood pressure is at goal. Continue amlodipine  5 mg daily, losartan -HCTZ (Hyzaar) 100-25 mg daily, and carvedilol  12.5 mg twice a day.        Endocrine   Type 2 diabetes mellitus with other specified complication (HCC) - Primary   Diabetes has been in excellent control. We will check her A1c today. Continue metformin  500 mg twice a day.      Relevant Orders   Basic metabolic panel with GFR   Hemoglobin A1c   Other Visit Diagnoses       Hypercalcemia       I will repeat her calcium  today. Mild elevation likely due to thiazide use. If remains high, will check ionized calcium  and PTH.     Need for immunization against influenza       Relevant Orders   Flu vaccine HIGH DOSE PF(Fluzone Trivalent) (Completed)       Return in about 3 months (around 04/01/2024) for Reassessment.    Garnette CHRISTELLA Simpler, MD

## 2024-02-06 DIAGNOSIS — H25013 Cortical age-related cataract, bilateral: Secondary | ICD-10-CM | POA: Diagnosis not present

## 2024-02-06 DIAGNOSIS — E119 Type 2 diabetes mellitus without complications: Secondary | ICD-10-CM | POA: Diagnosis not present

## 2024-02-06 DIAGNOSIS — H2513 Age-related nuclear cataract, bilateral: Secondary | ICD-10-CM | POA: Diagnosis not present

## 2024-02-06 DIAGNOSIS — H524 Presbyopia: Secondary | ICD-10-CM | POA: Diagnosis not present

## 2024-02-06 DIAGNOSIS — H52223 Regular astigmatism, bilateral: Secondary | ICD-10-CM | POA: Diagnosis not present

## 2024-02-12 DIAGNOSIS — Z87891 Personal history of nicotine dependence: Secondary | ICD-10-CM | POA: Diagnosis not present

## 2024-02-12 DIAGNOSIS — Z823 Family history of stroke: Secondary | ICD-10-CM | POA: Diagnosis not present

## 2024-02-12 DIAGNOSIS — E119 Type 2 diabetes mellitus without complications: Secondary | ICD-10-CM | POA: Diagnosis not present

## 2024-02-12 DIAGNOSIS — Z8249 Family history of ischemic heart disease and other diseases of the circulatory system: Secondary | ICD-10-CM | POA: Diagnosis not present

## 2024-02-12 DIAGNOSIS — I10 Essential (primary) hypertension: Secondary | ICD-10-CM | POA: Diagnosis not present

## 2024-02-12 DIAGNOSIS — B354 Tinea corporis: Secondary | ICD-10-CM | POA: Diagnosis not present

## 2024-02-12 DIAGNOSIS — Z809 Family history of malignant neoplasm, unspecified: Secondary | ICD-10-CM | POA: Diagnosis not present

## 2024-02-12 DIAGNOSIS — Z7984 Long term (current) use of oral hypoglycemic drugs: Secondary | ICD-10-CM | POA: Diagnosis not present

## 2024-03-25 ENCOUNTER — Other Ambulatory Visit: Payer: Self-pay | Admitting: Family Medicine

## 2024-03-25 DIAGNOSIS — I10 Essential (primary) hypertension: Secondary | ICD-10-CM

## 2024-04-01 ENCOUNTER — Ambulatory Visit: Admitting: Family Medicine

## 2024-04-01 ENCOUNTER — Ambulatory Visit: Payer: Self-pay | Admitting: Family Medicine

## 2024-04-01 ENCOUNTER — Encounter: Payer: Self-pay | Admitting: Family Medicine

## 2024-04-01 VITALS — BP 126/74 | HR 80 | Temp 97.3°F | Ht 62.0 in | Wt 208.4 lb

## 2024-04-01 DIAGNOSIS — Z7984 Long term (current) use of oral hypoglycemic drugs: Secondary | ICD-10-CM | POA: Diagnosis not present

## 2024-04-01 DIAGNOSIS — E1169 Type 2 diabetes mellitus with other specified complication: Secondary | ICD-10-CM

## 2024-04-01 DIAGNOSIS — K7581 Nonalcoholic steatohepatitis (NASH): Secondary | ICD-10-CM

## 2024-04-01 DIAGNOSIS — E66812 Obesity, class 2: Secondary | ICD-10-CM

## 2024-04-01 DIAGNOSIS — I10 Essential (primary) hypertension: Secondary | ICD-10-CM

## 2024-04-01 DIAGNOSIS — I679 Cerebrovascular disease, unspecified: Secondary | ICD-10-CM | POA: Diagnosis not present

## 2024-04-01 DIAGNOSIS — Z6838 Body mass index (BMI) 38.0-38.9, adult: Secondary | ICD-10-CM | POA: Diagnosis not present

## 2024-04-01 DIAGNOSIS — M2011 Hallux valgus (acquired), right foot: Secondary | ICD-10-CM | POA: Insufficient documentation

## 2024-04-01 LAB — BASIC METABOLIC PANEL WITH GFR
BUN: 18 mg/dL (ref 6–23)
CO2: 29 meq/L (ref 19–32)
Calcium: 10.5 mg/dL (ref 8.4–10.5)
Chloride: 102 meq/L (ref 96–112)
Creatinine, Ser: 0.92 mg/dL (ref 0.40–1.20)
GFR: 60.96 mL/min (ref >60.00)
Glucose, Bld: 103 mg/dL — ABNORMAL HIGH (ref 70–99)
Potassium: 4 meq/L (ref 3.5–5.1)
Sodium: 140 meq/L (ref 135–145)

## 2024-04-01 LAB — HEMOGLOBIN A1C: Hgb A1c MFr Bld: 6 % (ref 4.6–6.5)

## 2024-04-01 NOTE — Assessment & Plan Note (Signed)
 Blood pressure is at goal. Continue amlodipine  5 mg daily, losartan -HCTZ (Hyzaar) 100-25 mg daily, and carvedilol  12.5 mg twice a day.

## 2024-04-01 NOTE — Progress Notes (Signed)
 [facility] [address] [place] [address] Dept: [phone] Dept Fax: [phone]  Chronic Care Office Visit  Subjective:    Patient ID: Jennifer Saunders, female    DOB: February 21, 1951, 73 y.o..   MRN: [id number]  Chief Complaint  Patient presents with   Diabetes    3 month f/u DM.   Fasting today.  No concerns.    History of Present Illness:  Patient is in today for reassessment of chronic medical conditions.   [name] has a history of Type 2 diabetes, managed with metformin  1000 mg bid.    [name] has hypertension. She is managed with losartan -HCTZ (Hyzaar) 100-25 mg daily, amlodipine  5 mg daily, and carvedilol  12.5 mg bid.  At her last visit, [name] had a mildly elevated calcium  level.   Past Medical History: Patient Active Problem List   Diagnosis Date Noted   Constipation 05/30/2022   Osteopenia 08/19/2020   Neck pain on right side 06/02/2016   NASH (nonalcoholic steatohepatitis) 04/11/2016   History of adenomatous polyp of colon 10/17/2015   Class 2 severe obesity with serious comorbidity and body mass index (BMI) of 37.0 to 37.9 in adult 10/17/2015   GERD (gastroesophageal reflux disease) 08/15/2015   Undiagnosed cardiac murmurs 12/17/2012   Varicose veins 08/18/2010   Cerebrovascular disease- small vessel 05/17/2008   History of kidney stones 03/18/2008   History of shingles 04/25/2007   Type 2 diabetes mellitus with other specified complication (HCC) 12/28/2006   Anxiety state 12/28/2006   Essential hypertension 12/28/2006   Past Surgical History:  Procedure Laterality Date   COLONOSCOPY  2019   MS-MAC-suprep(good)-TA   POLYPECTOMY  2019   TA   Family History  Problem Relation Age of Onset   Heart disease Mother        CVA x 2   Breast cancer Mother 3   Cancer Mother    Colon polyps Sister 49   Intracerebral hemorrhage Sister    Colon cancer Sister 6   Cancer Sister     Aneurysm Sister    Hypertension Sister    Heart disease Sister    Colon cancer Brother 73   Colon polyps Brother 94   Diabetes Maternal Aunt    Diabetes Paternal Aunt    Esophageal cancer Neg Hx    Rectal cancer Neg Hx    Stomach cancer Neg Hx    Outpatient Medications Prior to Visit  Medication Sig Dispense Refill   Alcohol  Swabs  PADS Use daily before blood glucose 100 each 3   amLODipine  (NORVASC ) 5 MG tablet TAKE 1 TABLET EVERY DAY 90 tablet 3   aspirin 81 MG tablet Take 81 mg by mouth daily.     Blood Glucose Calibration (TRUE METRIX LEVEL 2) Normal SOLN Use daily to check blood glucose controls Dx: E11.9 1 each 2   Blood Glucose Monitoring Suppl (TRUE METRIX METER) w/Device KIT Check blood glucose once daily Dx: E11.9 1 kit 0   Calcium  Carbonate-Vit D-Min (CALCIUM  1200 PO) Take 1 tablet by mouth daily at 6 (six) AM.     carvedilol  (COREG ) 12.5 MG tablet TAKE 1 TABLET TWICE DAILY WITH MEALS 180 tablet 0   cholecalciferol (VITAMIN D3) 25 MCG (1000 UNIT) tablet Take 1,000 Units by mouth daily.     clotrimazole -betamethasone  (LOTRISONE ) cream Apply 1 Application topically 2 (two) times daily. For up to 2 weeks. 30 g 0   glucose blood (TRUE METRIX BLOOD GLUCOSE TEST) test strip  Check blood glucose daily. Dx: E11.9 100 each 12   losartan -hydrochlorothiazide (HYZAAR) 100-25 MG tablet TAKE 1 TABLET EVERY DAY 90 tablet 3   metFORMIN  (GLUCOPHAGE -XR) 500 MG 24 hr tablet TAKE 2 TABLETS TWICE DAILY 360 tablet 3   TRUEplus Lancets 33G MISC Check blood glucose daily Dx: E11.9 100 each 3   No facility-administered medications prior to visit.   No Known Allergies   Objective:   Today's Vitals   04/01/24 0903  BP: 126/74  Pulse: 80  Temp: (!) 97.3 F (36.3 C)  TempSrc: Temporal  SpO2: 95%  Weight: 208 lb 6.4 oz (94.5 kg)  Height: 5' 2 (1.575 m)   Body mass index is 38.12 kg/m.   General: Well developed, well nourished. No acute distress. Feet- Skin intact. No sign of maceration  between toes. Nails are normal. Bilateral hallux valgus   deformities noted. Dorsalis pedis and posterior tibial artery pulses are normal. 5.07 monofilament   testing normal. Psych: Alert and oriented. Normal mood and affect.  Health Maintenance Due  Topic Date Due   Zoster Vaccines- Shingrix (1 of 2) Never done   DTaP/Tdap/Td (3 - Td or Tdap) 01/16/2024   Mammogram  05/26/2024   Lab Results Last hemoglobin A1c Lab Results  Component Value Date   HGBA1C 6.5 01/01/2024   Last lipid panel Lab Results  Component Value Date   CHOL 115 10/01/2023   HDL 42.40 10/01/2023   LDLCALC 54 10/01/2023   TRIG 92.0 10/01/2023   CHOLHDL 3 10/01/2023     Assessment & Plan:   Problem List Items Addressed This Visit       Cardiovascular and Mediastinum   Essential hypertension - Primary   Blood pressure is at goal. Continue amlodipine  5 mg daily, losartan -HCTZ (Hyzaar) 100-25 mg daily, and carvedilol  12.5 mg twice a day.        Endocrine   Type 2 diabetes mellitus with other specified complication (HCC)   Diabetes has been in excellent control. Continue metformin  500 mg twice a day.      Relevant Orders   Hemoglobin A1c   Basic metabolic panel with GFR     Other   Class 2 obesity due to excess calories with body mass index (BMI) of 38.0 to 38.9 in adult   Maximum weight: 222 lbs (01/2021) Current weight: 208 lbs  Encouraged [name] to continue her weight loss efforts which have been helpful in the past.      Other Visit Diagnoses       Hypercalcemia       I will repeat Ms. Kofoed's calcium  level and check an intact PTH.   Relevant Orders   Basic metabolic panel with GFR   Parathyroid hormone, intact (no Ca)     Long term current use of oral hypoglycemic drug           Return in about 3 months (around 06/30/2024) for Reassessment.   Garnette CHRISTELLA Simpler, MD  I,Emily Lagle,acting as a scribe for Garnette CHRISTELLA Simpler, MD.,have documented all relevant documentation on the  behalf of Garnette CHRISTELLA Simpler, MD.  I, Garnette CHRISTELLA Simpler, MD, have reviewed all documentation for this visit. The documentation on 04/01/2024 for the exam, diagnosis, procedures, and orders are all accurate and complete.

## 2024-04-01 NOTE — Assessment & Plan Note (Signed)
 Diabetes has been in excellent control. Continue metformin  500 mg twice a day.

## 2024-04-01 NOTE — Assessment & Plan Note (Signed)
 Maximum weight: 222 lbs (01/2021) Current weight: 208 lbs  Encouraged Ms. Mastrianni to continue her weight loss efforts which have been helpful in the past.

## 2024-04-02 LAB — PARATHYROID HORMONE, INTACT (NO CA): PTH: 33 pg/mL (ref 16–77)

## 2024-04-02 NOTE — Assessment & Plan Note (Signed)
 Continue focus on blood pressure management. Continue aspirin 81 mg daily

## 2024-04-02 NOTE — Assessment & Plan Note (Signed)
 LFTs have normalized. Continue metformin  and working at weight loss.

## 2024-05-28 ENCOUNTER — Other Ambulatory Visit: Payer: Self-pay | Admitting: Family Medicine

## 2024-05-28 DIAGNOSIS — Z1231 Encounter for screening mammogram for malignant neoplasm of breast: Secondary | ICD-10-CM

## 2024-06-02 ENCOUNTER — Ambulatory Visit

## 2024-06-10 ENCOUNTER — Ambulatory Visit

## 2024-07-01 ENCOUNTER — Ambulatory Visit: Admitting: Family Medicine

## 2024-07-03 ENCOUNTER — Ambulatory Visit

## 2024-07-06 ENCOUNTER — Ambulatory Visit
